# Patient Record
Sex: Female | Born: 1935 | Race: White | Hispanic: No | Marital: Married | Smoking: Never smoker
Health system: Southern US, Community
[De-identification: ages and names within clinical notes are randomized; demographics above are authoritative.]

## PROBLEM LIST (undated history)

## (undated) DIAGNOSIS — E039 Hypothyroidism, unspecified: Secondary | ICD-10-CM

## (undated) DIAGNOSIS — K660 Peritoneal adhesions (postprocedural) (postinfection): Secondary | ICD-10-CM

## (undated) DIAGNOSIS — M549 Dorsalgia, unspecified: Secondary | ICD-10-CM

## (undated) DIAGNOSIS — I10 Essential (primary) hypertension: Secondary | ICD-10-CM

## (undated) DIAGNOSIS — G309 Alzheimer's disease, unspecified: Secondary | ICD-10-CM

## (undated) DIAGNOSIS — R413 Other amnesia: Secondary | ICD-10-CM

## (undated) DIAGNOSIS — G629 Polyneuropathy, unspecified: Secondary | ICD-10-CM

## (undated) DIAGNOSIS — K567 Ileus, unspecified: Secondary | ICD-10-CM

## (undated) DIAGNOSIS — C50919 Malignant neoplasm of unspecified site of unspecified female breast: Secondary | ICD-10-CM

## (undated) DIAGNOSIS — J302 Other seasonal allergic rhinitis: Secondary | ICD-10-CM

## (undated) DIAGNOSIS — L719 Rosacea, unspecified: Secondary | ICD-10-CM

## (undated) DIAGNOSIS — M25519 Pain in unspecified shoulder: Secondary | ICD-10-CM

## (undated) DIAGNOSIS — Z8542 Personal history of malignant neoplasm of other parts of uterus: Secondary | ICD-10-CM

## (undated) DIAGNOSIS — C50112 Malignant neoplasm of central portion of left female breast: Secondary | ICD-10-CM

## (undated) DIAGNOSIS — Z8679 Personal history of other diseases of the circulatory system: Secondary | ICD-10-CM

## (undated) DIAGNOSIS — F0280 Dementia in other diseases classified elsewhere without behavioral disturbance: Secondary | ICD-10-CM

## (undated) DIAGNOSIS — N3281 Overactive bladder: Secondary | ICD-10-CM

## (undated) DIAGNOSIS — E7800 Pure hypercholesterolemia, unspecified: Secondary | ICD-10-CM

## (undated) DIAGNOSIS — K520 Gastroenteritis and colitis due to radiation: Secondary | ICD-10-CM

## (undated) DIAGNOSIS — C55 Malignant neoplasm of uterus, part unspecified: Secondary | ICD-10-CM

## (undated) HISTORY — DX: Other seasonal allergic rhinitis: J30.2

## (undated) HISTORY — DX: Malignant neoplasm of unspecified site of unspecified female breast: C50.919

## (undated) HISTORY — DX: Dorsalgia, unspecified: M54.9

## (undated) HISTORY — DX: Polyneuropathy, unspecified: G62.9

## (undated) HISTORY — DX: Pure hypercholesterolemia, unspecified: E78.00

## (undated) HISTORY — DX: Other amnesia: R41.3

## (undated) HISTORY — DX: Personal history of other diseases of the circulatory system: Z86.79

## (undated) HISTORY — DX: Pain in unspecified shoulder: M25.519

## (undated) HISTORY — PX: ANKLE SURGERY: SHX546

## (undated) HISTORY — PX: HERNIA REPAIR: SHX51

## (undated) HISTORY — DX: Malignant neoplasm of uterus, part unspecified: C55

## (undated) HISTORY — DX: Hypothyroidism, unspecified: E03.9

## (undated) HISTORY — DX: Rosacea, unspecified: L71.9

---

## 1988-11-12 HISTORY — PX: MASTECTOMY PARTIAL / LUMPECTOMY: SUR851

## 2006-01-02 ENCOUNTER — Encounter: Admission: RE | Admit: 2006-01-02 | Discharge: 2006-01-02 | Payer: Self-pay | Admitting: Internal Medicine

## 2006-04-25 ENCOUNTER — Other Ambulatory Visit: Admission: RE | Admit: 2006-04-25 | Discharge: 2006-04-25 | Payer: Self-pay | Admitting: Obstetrics and Gynecology

## 2006-07-02 ENCOUNTER — Encounter: Admission: RE | Admit: 2006-07-02 | Discharge: 2006-07-02 | Payer: Self-pay | Admitting: Obstetrics and Gynecology

## 2007-01-02 ENCOUNTER — Encounter: Admission: RE | Admit: 2007-01-02 | Discharge: 2007-01-02 | Payer: Self-pay | Admitting: Internal Medicine

## 2007-05-08 ENCOUNTER — Other Ambulatory Visit: Admission: RE | Admit: 2007-05-08 | Discharge: 2007-05-08 | Payer: Self-pay | Admitting: Obstetrics and Gynecology

## 2007-07-04 ENCOUNTER — Encounter: Admission: RE | Admit: 2007-07-04 | Discharge: 2007-07-04 | Payer: Self-pay | Admitting: Obstetrics and Gynecology

## 2008-07-05 ENCOUNTER — Encounter: Admission: RE | Admit: 2008-07-05 | Discharge: 2008-07-05 | Payer: Self-pay | Admitting: Obstetrics and Gynecology

## 2008-11-12 HISTORY — PX: ABDOMINAL HYSTERECTOMY: SHX81

## 2009-06-16 ENCOUNTER — Other Ambulatory Visit: Admission: RE | Admit: 2009-06-16 | Discharge: 2009-06-16 | Payer: Self-pay | Admitting: Obstetrics and Gynecology

## 2009-07-06 ENCOUNTER — Encounter: Admission: RE | Admit: 2009-07-06 | Discharge: 2009-07-06 | Payer: Self-pay | Admitting: Internal Medicine

## 2009-07-28 ENCOUNTER — Ambulatory Visit (HOSPITAL_COMMUNITY): Admission: RE | Admit: 2009-07-28 | Discharge: 2009-07-29 | Payer: Self-pay | Admitting: Obstetrics and Gynecology

## 2009-07-28 ENCOUNTER — Encounter (INDEPENDENT_AMBULATORY_CARE_PROVIDER_SITE_OTHER): Payer: Self-pay | Admitting: Obstetrics and Gynecology

## 2009-10-11 ENCOUNTER — Ambulatory Visit: Admission: RE | Admit: 2009-10-11 | Discharge: 2009-11-11 | Payer: Self-pay | Admitting: Radiation Oncology

## 2009-11-14 ENCOUNTER — Ambulatory Visit: Admission: RE | Admit: 2009-11-14 | Discharge: 2009-12-22 | Payer: Self-pay | Admitting: Radiation Oncology

## 2009-11-17 LAB — CBC WITH DIFFERENTIAL/PLATELET
BASO%: 0.9 % (ref 0.0–2.0)
Basophils Absolute: 0 10*3/uL (ref 0.0–0.1)
MONO%: 8.5 % (ref 0.0–14.0)
NEUT#: 2.2 10*3/uL (ref 1.5–6.5)
NEUT%: 55.9 % (ref 38.4–76.8)
RBC: 3.17 10*6/uL — ABNORMAL LOW (ref 3.70–5.45)
RDW: 16.7 % — ABNORMAL HIGH (ref 11.2–14.5)
WBC: 3.9 10*3/uL (ref 3.9–10.3)
lymph#: 1.1 10*3/uL (ref 0.9–3.3)

## 2009-11-17 LAB — BASIC METABOLIC PANEL
BUN: 9 mg/dL (ref 6–23)
Calcium: 9.4 mg/dL (ref 8.4–10.5)
Glucose, Bld: 85 mg/dL (ref 70–99)
Sodium: 142 mEq/L (ref 135–145)

## 2009-12-13 ENCOUNTER — Ambulatory Visit: Payer: Self-pay | Admitting: Psychiatry

## 2009-12-14 LAB — CBC WITH DIFFERENTIAL/PLATELET
Eosinophils Absolute: 1.2 10*3/uL — ABNORMAL HIGH (ref 0.0–0.5)
MONO#: 0.5 10*3/uL (ref 0.1–0.9)
NEUT#: 3 10*3/uL (ref 1.5–6.5)
NEUT%: 58.4 % (ref 38.4–76.8)
Platelets: 260 10*3/uL (ref 145–400)
RBC: 3.13 10*6/uL — ABNORMAL LOW (ref 3.70–5.45)
RDW: 15.3 % — ABNORMAL HIGH (ref 11.2–14.5)
WBC: 5.2 10*3/uL (ref 3.9–10.3)

## 2009-12-14 LAB — BASIC METABOLIC PANEL
BUN: 9 mg/dL (ref 6–23)
Calcium: 9.3 mg/dL (ref 8.4–10.5)
Creatinine, Ser: 0.82 mg/dL (ref 0.40–1.20)
Glucose, Bld: 96 mg/dL (ref 70–99)
Sodium: 139 mEq/L (ref 135–145)

## 2009-12-28 ENCOUNTER — Ambulatory Visit: Admission: RE | Admit: 2009-12-28 | Discharge: 2010-01-12 | Payer: Self-pay | Admitting: Radiation Oncology

## 2010-03-31 ENCOUNTER — Encounter: Admission: RE | Admit: 2010-03-31 | Discharge: 2010-03-31 | Payer: Self-pay | Admitting: Neurology

## 2010-07-07 ENCOUNTER — Encounter: Admission: RE | Admit: 2010-07-07 | Discharge: 2010-07-07 | Payer: Self-pay | Admitting: Internal Medicine

## 2010-08-21 ENCOUNTER — Ambulatory Visit: Admission: RE | Admit: 2010-08-21 | Discharge: 2010-08-21 | Payer: Self-pay | Admitting: Radiation Oncology

## 2010-08-22 ENCOUNTER — Other Ambulatory Visit: Admission: RE | Admit: 2010-08-22 | Discharge: 2010-08-22 | Payer: Self-pay | Admitting: Radiation Oncology

## 2011-02-16 LAB — CBC
MCV: 99.2 fL (ref 78.0–100.0)
MCV: 98.8 fL (ref 78.0–100.0)
Platelets: 257 10*3/uL (ref 150–400)
RBC: 3.26 MIL/uL — ABNORMAL LOW (ref 3.87–5.11)
RBC: 4.1 MIL/uL (ref 3.87–5.11)
RDW: 12.7 % (ref 11.5–15.5)

## 2011-02-16 LAB — BASIC METABOLIC PANEL
GFR calc non Af Amer: 53 mL/min — ABNORMAL LOW (ref >60)
Sodium: 142 mEq/L (ref 135–145)

## 2011-02-16 LAB — URINALYSIS, MICROSCOPIC ONLY
Glucose, UA: NEGATIVE mg/dL
Hgb urine dipstick: NEGATIVE
Nitrite: NEGATIVE
Specific Gravity, Urine: 1.02 (ref 1.005–1.030)
pH: 5.5 (ref 5.0–8.0)

## 2011-02-16 LAB — TYPE AND SCREEN
ABO/RH(D): B POS
Antibody Screen: NEGATIVE

## 2011-02-26 ENCOUNTER — Ambulatory Visit: Payer: Medicare Other | Attending: Radiation Oncology | Admitting: Radiation Oncology

## 2011-02-26 ENCOUNTER — Other Ambulatory Visit (HOSPITAL_COMMUNITY)
Admission: RE | Admit: 2011-02-26 | Discharge: 2011-02-26 | Disposition: A | Discharge status: Home / Self Care | Payer: Medicare Other | Source: Ambulatory Visit | Attending: Radiation Oncology | Admitting: Radiation Oncology

## 2011-02-26 ENCOUNTER — Other Ambulatory Visit: Payer: Self-pay | Admitting: Radiation Oncology

## 2011-02-26 DIAGNOSIS — Z8540 Personal history of malignant neoplasm of unspecified female genital organ: Secondary | ICD-10-CM | POA: Insufficient documentation

## 2011-06-22 ENCOUNTER — Other Ambulatory Visit: Payer: Self-pay | Admitting: Obstetrics and Gynecology

## 2011-06-22 DIAGNOSIS — Z1231 Encounter for screening mammogram for malignant neoplasm of breast: Secondary | ICD-10-CM

## 2011-07-24 ENCOUNTER — Ambulatory Visit
Admission: RE | Admit: 2011-07-24 | Discharge: 2011-07-24 | Disposition: A | Discharge status: Home / Self Care | Payer: Medicare Other | Source: Ambulatory Visit | Attending: Obstetrics and Gynecology | Admitting: Obstetrics and Gynecology

## 2011-07-24 DIAGNOSIS — Z1231 Encounter for screening mammogram for malignant neoplasm of breast: Secondary | ICD-10-CM

## 2011-09-04 ENCOUNTER — Ambulatory Visit
Admission: RE | Admit: 2011-09-04 | Discharge: 2011-09-04 | Disposition: A | Discharge status: Home / Self Care | Payer: Medicare Other | Source: Ambulatory Visit | Attending: Radiation Oncology | Admitting: Radiation Oncology

## 2011-12-27 ENCOUNTER — Other Ambulatory Visit (HOSPITAL_COMMUNITY): Payer: Self-pay | Admitting: Obstetrics and Gynecology

## 2011-12-27 DIAGNOSIS — C541 Malignant neoplasm of endometrium: Secondary | ICD-10-CM

## 2012-03-28 ENCOUNTER — Ambulatory Visit (HOSPITAL_COMMUNITY)
Admission: RE | Admit: 2012-03-28 | Discharge: 2012-03-28 | Disposition: A | Discharge status: Home / Self Care | Payer: Medicare Other | Source: Ambulatory Visit | Attending: Obstetrics and Gynecology | Admitting: Obstetrics and Gynecology

## 2012-03-28 DIAGNOSIS — Z90710 Acquired absence of both cervix and uterus: Secondary | ICD-10-CM | POA: Insufficient documentation

## 2012-03-28 DIAGNOSIS — K7689 Other specified diseases of liver: Secondary | ICD-10-CM | POA: Insufficient documentation

## 2012-03-28 DIAGNOSIS — C541 Malignant neoplasm of endometrium: Secondary | ICD-10-CM

## 2012-03-28 DIAGNOSIS — C50919 Malignant neoplasm of unspecified site of unspecified female breast: Secondary | ICD-10-CM | POA: Insufficient documentation

## 2012-03-28 LAB — BUN: BUN: 14 mg/dL (ref 6–23)

## 2012-03-28 LAB — CREATININE, SERUM: Creatinine, Ser: 1.04 mg/dL (ref 0.50–1.10)

## 2012-03-28 MED ORDER — IOHEXOL 300 MG/ML  SOLN
100.0000 mL | Freq: Once | INTRAMUSCULAR | Status: AC | PRN
  Administered 2012-03-28: 100 mL via INTRAVENOUS

## 2012-07-15 ENCOUNTER — Other Ambulatory Visit: Payer: Self-pay | Admitting: Obstetrics and Gynecology

## 2012-07-15 DIAGNOSIS — Z1231 Encounter for screening mammogram for malignant neoplasm of breast: Secondary | ICD-10-CM

## 2012-08-14 ENCOUNTER — Ambulatory Visit
Admission: RE | Admit: 2012-08-14 | Discharge: 2012-08-14 | Disposition: A | Discharge status: Home / Self Care | Payer: Medicare Other | Source: Ambulatory Visit | Attending: Obstetrics and Gynecology | Admitting: Obstetrics and Gynecology

## 2012-08-14 DIAGNOSIS — Z1231 Encounter for screening mammogram for malignant neoplasm of breast: Secondary | ICD-10-CM

## 2012-09-30 ENCOUNTER — Inpatient Hospital Stay (HOSPITAL_COMMUNITY)
Admission: EM | Admit: 2012-09-30 | Discharge: 2012-10-02 | DRG: 394 | Disposition: A | Discharge status: Home / Self Care | Payer: Medicare Other | Attending: Surgery | Admitting: Surgery

## 2012-09-30 ENCOUNTER — Encounter (HOSPITAL_COMMUNITY): Payer: Self-pay

## 2012-09-30 ENCOUNTER — Emergency Department (HOSPITAL_COMMUNITY): Payer: Medicare Other

## 2012-09-30 DIAGNOSIS — Y842 Radiological procedure and radiotherapy as the cause of abnormal reaction of the patient, or of later complication, without mention of misadventure at the time of the procedure: Secondary | ICD-10-CM | POA: Diagnosis present

## 2012-09-30 DIAGNOSIS — R112 Nausea with vomiting, unspecified: Secondary | ICD-10-CM | POA: Diagnosis present

## 2012-09-30 DIAGNOSIS — K520 Gastroenteritis and colitis due to radiation: Principal | ICD-10-CM | POA: Diagnosis present

## 2012-09-30 DIAGNOSIS — Z8542 Personal history of malignant neoplasm of other parts of uterus: Secondary | ICD-10-CM

## 2012-09-30 DIAGNOSIS — K56609 Unspecified intestinal obstruction, unspecified as to partial versus complete obstruction: Secondary | ICD-10-CM

## 2012-09-30 DIAGNOSIS — I10 Essential (primary) hypertension: Secondary | ICD-10-CM | POA: Diagnosis present

## 2012-09-30 DIAGNOSIS — T66XXXS Radiation sickness, unspecified, sequela: Secondary | ICD-10-CM

## 2012-09-30 DIAGNOSIS — K565 Intestinal adhesions [bands], unspecified as to partial versus complete obstruction: Secondary | ICD-10-CM | POA: Diagnosis present

## 2012-09-30 HISTORY — DX: Essential (primary) hypertension: I10

## 2012-09-30 HISTORY — DX: Overactive bladder: N32.81

## 2012-09-30 LAB — CBC WITH DIFFERENTIAL/PLATELET
Basophils Absolute: 0.1 10*3/uL (ref 0.0–0.1)
Basophils Relative: 1 % (ref 0–1)
Eosinophils Relative: 1 % (ref 0–5)
HCT: 40.8 % (ref 36.0–46.0)
Hemoglobin: 13.7 g/dL (ref 12.0–15.0)
MCH: 32.8 pg (ref 26.0–34.0)
MCHC: 33.6 g/dL (ref 30.0–36.0)
MCV: 97.6 fL (ref 78.0–100.0)
Monocytes Absolute: 0.7 10*3/uL (ref 0.1–1.0)
Monocytes Relative: 7 % (ref 3–12)
Neutro Abs: 8.2 10*3/uL — ABNORMAL HIGH (ref 1.7–7.7)
RDW: 13.2 % (ref 11.5–15.5)

## 2012-09-30 LAB — URINALYSIS, ROUTINE W REFLEX MICROSCOPIC
Bilirubin Urine: NEGATIVE
Glucose, UA: NEGATIVE mg/dL
Hgb urine dipstick: NEGATIVE
Ketones, ur: 15 mg/dL — AB
Protein, ur: NEGATIVE mg/dL
pH: 8 (ref 5.0–8.0)

## 2012-09-30 LAB — COMPREHENSIVE METABOLIC PANEL
AST: 28 U/L (ref 0–37)
Albumin: 4 g/dL (ref 3.5–5.2)
BUN: 19 mg/dL (ref 6–23)
Calcium: 10.2 mg/dL (ref 8.4–10.5)
Chloride: 104 mEq/L (ref 96–112)
Creatinine, Ser: 0.85 mg/dL (ref 0.50–1.10)
GFR calc non Af Amer: 65 mL/min — ABNORMAL LOW (ref >90)
Total Bilirubin: 0.5 mg/dL (ref 0.3–1.2)

## 2012-09-30 LAB — URINE MICROSCOPIC-ADD ON

## 2012-09-30 LAB — LIPASE, BLOOD: Lipase: 45 U/L (ref 11–59)

## 2012-09-30 MED ORDER — MIRABEGRON ER 25 MG PO TB24: 25.0000 mg | ORAL_TABLET | Freq: Every day | ORAL | Status: DC

## 2012-09-30 MED ORDER — GABAPENTIN 100 MG PO CAPS
100.0000 mg | ORAL_CAPSULE | Freq: Three times a day (TID) | ORAL | Status: DC
  Administered 2012-09-30 – 2012-10-02 (×6): 100 mg via ORAL
  Filled 2012-09-30 (×8): qty 1

## 2012-09-30 MED ORDER — DIPHENHYDRAMINE HCL 50 MG/ML IJ SOLN: 12.5000 mg | Freq: Four times a day (QID) | INTRAMUSCULAR | Status: DC | PRN

## 2012-09-30 MED ORDER — SODIUM CHLORIDE 0.9 % IV SOLN
Freq: Once | INTRAVENOUS | Status: AC
  Administered 2012-09-30: 07:00:00 via INTRAVENOUS

## 2012-09-30 MED ORDER — ENOXAPARIN SODIUM 40 MG/0.4ML ~~LOC~~ SOLN
40.0000 mg | SUBCUTANEOUS | Status: DC
  Filled 2012-09-30: qty 0.4

## 2012-09-30 MED ORDER — ONDANSETRON HCL 4 MG/2ML IJ SOLN: 4.0000 mg | Freq: Four times a day (QID) | INTRAMUSCULAR | Status: DC | PRN

## 2012-09-30 MED ORDER — PROPRANOLOL HCL ER 120 MG PO CP24
120.0000 mg | ORAL_CAPSULE | Freq: Every day | ORAL | Status: DC
  Administered 2012-09-30 – 2012-10-02 (×3): 120 mg via ORAL
  Filled 2012-09-30 (×3): qty 1

## 2012-09-30 MED ORDER — FENTANYL CITRATE 0.05 MG/ML IJ SOLN
25.0000 ug | Freq: Once | INTRAMUSCULAR | Status: AC
  Administered 2012-09-30: 25 ug via INTRAVENOUS
  Filled 2012-09-30: qty 2

## 2012-09-30 MED ORDER — KCL IN DEXTROSE-NACL 20-5-0.9 MEQ/L-%-% IV SOLN
INTRAVENOUS | Status: DC
  Administered 2012-09-30: 100 mL/h via INTRAVENOUS
  Administered 2012-10-01 (×2): via INTRAVENOUS
  Filled 2012-09-30 (×5): qty 1000

## 2012-09-30 MED ORDER — SODIUM CHLORIDE 0.9 % IV BOLUS (SEPSIS)
1000.0000 mL | Freq: Once | INTRAVENOUS | Status: AC
  Administered 2012-09-30: 1000 mL via INTRAVENOUS

## 2012-09-30 MED ORDER — DIPHENHYDRAMINE HCL 12.5 MG/5ML PO ELIX: 12.5000 mg | ORAL_SOLUTION | Freq: Four times a day (QID) | ORAL | Status: DC | PRN

## 2012-09-30 MED ORDER — OXYCODONE HCL 5 MG PO TABS: 5.0000 mg | ORAL_TABLET | ORAL | Status: DC | PRN

## 2012-09-30 MED ORDER — DOCUSATE SODIUM 100 MG PO CAPS
100.0000 mg | ORAL_CAPSULE | Freq: Two times a day (BID) | ORAL | Status: DC
  Administered 2012-09-30 – 2012-10-02 (×4): 100 mg via ORAL
  Filled 2012-09-30 (×5): qty 1

## 2012-09-30 MED ORDER — ACETAMINOPHEN 325 MG PO TABS: 650.0000 mg | ORAL_TABLET | Freq: Four times a day (QID) | ORAL | Status: DC | PRN

## 2012-09-30 MED ORDER — IOHEXOL 300 MG/ML  SOLN
100.0000 mL | Freq: Once | INTRAMUSCULAR | Status: AC | PRN
  Administered 2012-09-30: 100 mL via INTRAVENOUS

## 2012-09-30 MED ORDER — ATORVASTATIN CALCIUM 20 MG PO TABS
20.0000 mg | ORAL_TABLET | Freq: Every day | ORAL | Status: DC
  Administered 2012-09-30 – 2012-10-01 (×2): 20 mg via ORAL
  Filled 2012-09-30 (×3): qty 1

## 2012-09-30 MED ORDER — ONDANSETRON HCL 4 MG/2ML IJ SOLN
4.0000 mg | Freq: Once | INTRAMUSCULAR | Status: AC
  Administered 2012-09-30: 4 mg via INTRAVENOUS
  Filled 2012-09-30: qty 2

## 2012-09-30 MED ORDER — HYDROMORPHONE HCL PF 1 MG/ML IJ SOLN: 0.5000 mg | INTRAMUSCULAR | Status: DC | PRN

## 2012-09-30 MED ORDER — PROMETHAZINE HCL 25 MG/ML IJ SOLN
12.5000 mg | Freq: Three times a day (TID) | INTRAMUSCULAR | Status: DC | PRN
Start: 2012-09-30 — End: 2012-10-02
  Filled 2012-09-30: qty 1

## 2012-09-30 MED ORDER — ENOXAPARIN SODIUM 40 MG/0.4ML ~~LOC~~ SOLN
40.0000 mg | SUBCUTANEOUS | Status: DC
  Administered 2012-10-01: 40 mg via SUBCUTANEOUS
  Filled 2012-09-30 (×3): qty 0.4

## 2012-09-30 MED ORDER — ACETAMINOPHEN 650 MG RE SUPP: 650.0000 mg | Freq: Four times a day (QID) | RECTAL | Status: DC | PRN

## 2012-09-30 MED ORDER — DARIFENACIN HYDROBROMIDE ER 7.5 MG PO TB24: 7.5000 mg | ORAL_TABLET | Freq: Every day | ORAL | Status: DC

## 2012-09-30 MED ORDER — MORPHINE SULFATE 2 MG/ML IJ SOLN
2.0000 mg | Freq: Once | INTRAMUSCULAR | Status: AC
  Administered 2012-09-30: 2 mg via INTRAVENOUS
  Filled 2012-09-30: qty 1

## 2012-09-30 MED ORDER — PANTOPRAZOLE SODIUM 40 MG IV SOLR
40.0000 mg | Freq: Every day | INTRAVENOUS | Status: DC
  Administered 2012-09-30 – 2012-10-01 (×2): 40 mg via INTRAVENOUS
  Filled 2012-09-30 (×3): qty 40

## 2012-09-30 NOTE — ED Notes (Signed)
Per pt started having abdominal pain and n/v since late afternoon yesterday.  Pain mid abdomen.

## 2012-09-30 NOTE — H&P (Signed)
Agree with above. Partial SBO possible due to adhesions vs radiation enteritis  No indication yet for NG tube  Bowel rest/ IV hydration.  Wilmon Arms. Corliss Skains, MD, [facility] Surgery  09/30/2012 1:17 PM

## 2012-09-30 NOTE — ED Notes (Signed)
Patient transported to CT 

## 2012-09-30 NOTE — ED Provider Notes (Signed)
Results for orders placed during the hospital encounter of 09/30/12  URINALYSIS, ROUTINE W REFLEX MICROSCOPIC      Component Value Range   Color, Urine YELLOW  YELLOW   APPearance CLOUDY (*) CLEAR   Specific Gravity, Urine 1.029  1.005 - 1.030   pH 8.0  5.0 - 8.0   Glucose, UA NEGATIVE  NEGATIVE mg/dL   Hgb urine dipstick NEGATIVE  NEGATIVE   Bilirubin Urine NEGATIVE  NEGATIVE   Ketones, ur 15 (*) NEGATIVE mg/dL   Protein, ur NEGATIVE  NEGATIVE mg/dL   Urobilinogen, UA 0.2  0.0 - 1.0 mg/dL   Nitrite NEGATIVE  NEGATIVE   Leukocytes, UA SMALL (*) NEGATIVE  CBC WITH DIFFERENTIAL      Component Value Range   WBC 10.1  4.0 - 10.5 K/uL   RBC 4.18  3.87 - 5.11 MIL/uL   Hemoglobin 13.7  12.0 - 15.0 g/dL   HCT 16.1  09.6 - 04.5 %   MCV 97.6  78.0 - 100.0 fL   MCH 32.8  26.0 - 34.0 pg   MCHC 33.6  30.0 - 36.0 g/dL   RDW 40.9  81.1 - 91.4 %   Platelets 230  150 - 400 K/uL   Neutrophils Relative 81 (*) 43 - 77 %   Neutro Abs 8.2 (*) 1.7 - 7.7 K/uL   Lymphocytes Relative 11 (*) 12 - 46 %   Lymphs Abs 1.1  0.7 - 4.0 K/uL   Monocytes Relative 7  3 - 12 %   Monocytes Absolute 0.7  0.1 - 1.0 K/uL   Eosinophils Relative 1  0 - 5 %   Eosinophils Absolute 0.1  0.0 - 0.7 K/uL   Basophils Relative 1  0 - 1 %   Basophils Absolute 0.1  0.0 - 0.1 K/uL  COMPREHENSIVE METABOLIC PANEL      Component Value Range   Sodium 139  135 - 145 mEq/L   Potassium 4.1  3.5 - 5.1 mEq/L   Chloride 104  96 - 112 mEq/L   CO2 20  19 - 32 mEq/L   Glucose, Bld 124 (*) 70 - 99 mg/dL   BUN 19  6 - 23 mg/dL   Creatinine, Ser 7.82  0.50 - 1.10 mg/dL   Calcium 95.6  8.4 - 21.3 mg/dL   Total Protein 7.2  6.0 - 8.3 g/dL   Albumin 4.0  3.5 - 5.2 g/dL   AST 28  0 - 37 U/L   ALT 19  0 - 35 U/L   Alkaline Phosphatase 73  39 - 117 U/L   Total Bilirubin 0.5  0.3 - 1.2 mg/dL   GFR calc non Af Amer 65 (*) >90 mL/min   GFR calc Af Amer 76 (*) >90 mL/min  LIPASE, BLOOD      Component Value Range   Lipase 45  11 - 59 U/L    URINE MICROSCOPIC-ADD ON      Component Value Range   WBC, UA 0-2  <3 WBC/hpf   RBC / HPF 0-2  <3 RBC/hpf   Urine-Other AMORPHOUS URATES/PHOSPHATES     Ct Abdomen Pelvis W Contrast  09/30/2012  *RADIOLOGY REPORT*  Clinical Data: Nausea, vomiting, abdominal pain, history of endometrial carcinoma  CT ABDOMEN AND PELVIS WITH CONTRAST  Technique:  Multidetector CT imaging of the abdomen and pelvis was performed following the standard protocol during bolus administration of intravenous contrast.  Contrast: OMNIPAQUE IOHEXOL 300 MG/ML  SOLN  Comparison: CT abdomen pelvis of  03/28/2012  Findings: The lung bases are clear.  Mild cardiomegaly is stable. The previously described sub centimeter low attenuation structures in the liver are stable and consistent with a benign process.  No new hepatic lesion is seen.  No calcified gallstones are noted. The pancreas is normal in size and the pancreatic duct is not dilated.  The adrenal glands and spleen are unremarkable.  The stomach is distended with contrast material and is unremarkable. The kidneys enhance with no calculus or mass and no hydronephrosis is seen.  The abdominal aorta is normal in caliber.  There are dilated loops of small bowel within the pelvis and lower abdomen consistent with a partial small bowel obstruction.  The distal small bowel is decompressed.  The porta caliber change appears to be within the mid pelvis and there is some thickening of the small bowel mucosa at that site.  Some thickening could be due to radiation enteritis, but edema cannot be excluded.  There is a small amount of interloop fluid within the pelvis.  The urinary bladder is decompressed and cannot be evaluated.  The patient has previous undergone hysterectomy.  No mass or adenopathy is seen.  There is degenerative disc disease at L5-S1 with a very slight anterolisthesis of L5 on S1 and L4 on L5 which appears degenerative in origin.  IMPRESSION:  1.  Dilated loops of small  bowel with apparent transition point in the mid lower pelvis consistent with partial small bowel obstruction. 2.  Some mucosal thickening at that site may indicate radiation enteritis or possibly edema.  There is a small amount of fluid within the pelvis. 3.  Slight anterolisthesis of L5 on S1 and L4 on L5 most likely degenerative in origin.   Original Report Authenticated By: Dwyane Dee, M.D.     Consulted surgery who will come and see/admit pt.  Rolan Bucco, MD 09/30/12 [phone]

## 2012-09-30 NOTE — ED Notes (Signed)
Report called to the floor nurse unavailable at this time call try again

## 2012-09-30 NOTE — H&P (Signed)
Laura Perkins is an 77 y.o. female.   Chief Complaint: abdominal pain with nausea andf vomiting HPI: 77 year old female presents emergency department with report of onset of nausea and vomiting and diffuse abdominal pain yesterday afternoon. No fevers no chills no diarrhea. No known sick contacts, no unusual foods or travel. No new meds recently. No prior history of same. Patient with history of uterine cancer status post hysterectomy. No other abdominal surgeries.She reports she's been cancer free for 3 years. No prior history of bowel obstruction. She reports symptoms come in waves. CT of abdomen and pelvis done on admission: IMPRESSION:  1. Dilated loops of small bowel with apparent transition point in  the mid lower pelvis consistent with partial small bowel  obstruction.  2. Some mucosal thickening at that site may indicate radiation  enteritis or possibly edema. There is a small amount of fluid  within the pelvis.  3. Slight anterolisthesis of L5 on S1 and L4 on L5 most likely  degenerative in origin.  Original Report Authenticated By: Dwyane Dee, M.D. At present patient is w/o N/V/D or abdominal tenderness; minimal BS,  states that the last time she had emesis was at 3am this morning, last BM was around the same time described as several small ones all formed. In the interim patient has rec'vd IVF and pain medications and antiemetics in the ED. We have been asked to see the patient and to admit her for management of her psbo. She will be admitted to med-surg floorCurrently BMP and WBC are wnl.  Not sure as to the cause; but this may represent radiation enteritis or edema secondary to her previous treatment for uterine cancer.  . We will treat her initially with conservative measures i.e. NPO, IVF,follow labs and clinical picture.   Past Medical History  Diagnosis Date  . Hypertension   . Overactive bladder   . Cancer     carcinoma sarcoma    Past Surgical History  Procedure  Date  . Abdominal hysterectomy   . Breast biopsy   . Ankle surgery     History reviewed. No pertinent family history. Social History:  reports that she has never smoked. She does not have any smokeless tobacco history on file. She reports that she does not drink alcohol or use illicit drugs.  Allergies: No Known Allergies   (Not in a hospital admission)  Results for orders placed during the hospital encounter of 09/30/12 (from the past 48 hour(s))  URINALYSIS, ROUTINE W REFLEX MICROSCOPIC     Status: Abnormal   Collection Time   09/30/12  4:29 AM      Component Value Range Comment   Color, Urine YELLOW  YELLOW    APPearance CLOUDY (*) CLEAR    Specific Gravity, Urine 1.029  1.005 - 1.030    pH 8.0  5.0 - 8.0    Glucose, UA NEGATIVE  NEGATIVE mg/dL    Hgb urine dipstick NEGATIVE  NEGATIVE    Bilirubin Urine NEGATIVE  NEGATIVE    Ketones, ur 15 (*) NEGATIVE mg/dL    Protein, ur NEGATIVE  NEGATIVE mg/dL    Urobilinogen, UA 0.2  0.0 - 1.0 mg/dL    Nitrite NEGATIVE  NEGATIVE    Leukocytes, UA SMALL (*) NEGATIVE   URINE MICROSCOPIC-ADD ON     Status: Normal   Collection Time   09/30/12  4:29 AM      Component Value Range Comment   WBC, UA 0-2  <3 WBC/hpf    RBC /  HPF 0-2  <3 RBC/hpf    Urine-Other AMORPHOUS URATES/PHOSPHATES     CBC WITH DIFFERENTIAL     Status: Abnormal   Collection Time   09/30/12  5:02 AM      Component Value Range Comment   WBC 10.1  4.0 - 10.5 K/uL    RBC 4.18  3.87 - 5.11 MIL/uL    Hemoglobin 13.7  12.0 - 15.0 g/dL    HCT 04.5  40.9 - 81.1 %    MCV 97.6  78.0 - 100.0 fL    MCH 32.8  26.0 - 34.0 pg    MCHC 33.6  30.0 - 36.0 g/dL    RDW 91.4  78.2 - 95.6 %    Platelets 230  150 - 400 K/uL    Neutrophils Relative 81 (*) 43 - 77 %    Neutro Abs 8.2 (*) 1.7 - 7.7 K/uL    Lymphocytes Relative 11 (*) 12 - 46 %    Lymphs Abs 1.1  0.7 - 4.0 K/uL    Monocytes Relative 7  3 - 12 %    Monocytes Absolute 0.7  0.1 - 1.0 K/uL    Eosinophils Relative 1  0 - 5  %    Eosinophils Absolute 0.1  0.0 - 0.7 K/uL    Basophils Relative 1  0 - 1 %    Basophils Absolute 0.1  0.0 - 0.1 K/uL   COMPREHENSIVE METABOLIC PANEL     Status: Abnormal   Collection Time   09/30/12  5:02 AM      Component Value Range Comment   Sodium 139  135 - 145 mEq/L    Potassium 4.1  3.5 - 5.1 mEq/L    Chloride 104  96 - 112 mEq/L    CO2 20  19 - 32 mEq/L    Glucose, Bld 124 (*) 70 - 99 mg/dL    BUN 19  6 - 23 mg/dL    Creatinine, Ser 2.13  0.50 - 1.10 mg/dL    Calcium 08.6  8.4 - 10.5 mg/dL    Total Protein 7.2  6.0 - 8.3 g/dL    Albumin 4.0  3.5 - 5.2 g/dL    AST 28  0 - 37 U/L    ALT 19  0 - 35 U/L    Alkaline Phosphatase 73  39 - 117 U/L    Total Bilirubin 0.5  0.3 - 1.2 mg/dL    GFR calc non Af Amer 65 (*) >90 mL/min    GFR calc Af Amer 76 (*) >90 mL/min   LIPASE, BLOOD     Status: Normal   Collection Time   09/30/12  5:02 AM      Component Value Range Comment   Lipase 45  11 - 59 U/L    Ct Abdomen Pelvis W Contrast  09/30/2012  *RADIOLOGY REPORT*  Clinical Data: Nausea, vomiting, abdominal pain, history of endometrial carcinoma  CT ABDOMEN AND PELVIS WITH CONTRAST  Technique:  Multidetector CT imaging of the abdomen and pelvis was performed following the standard protocol during bolus administration of intravenous contrast.  Contrast: OMNIPAQUE IOHEXOL 300 MG/ML  SOLN  Comparison: CT abdomen pelvis of 03/28/2012  Findings: The lung bases are clear.  Mild cardiomegaly is stable. The previously described sub centimeter low attenuation structures in the liver are stable and consistent with a benign process.  No new hepatic lesion is seen.  No calcified gallstones are noted. The pancreas is normal in size and the  pancreatic duct is not dilated.  The adrenal glands and spleen are unremarkable.  The stomach is distended with contrast material and is unremarkable. The kidneys enhance with no calculus or mass and no hydronephrosis is seen.  The abdominal aorta is normal  in caliber.  There are dilated loops of small bowel within the pelvis and lower abdomen consistent with a partial small bowel obstruction.  The distal small bowel is decompressed.  The porta caliber change appears to be within the mid pelvis and there is some thickening of the small bowel mucosa at that site.  Some thickening could be due to radiation enteritis, but edema cannot be excluded.  There is a small amount of interloop fluid within the pelvis.  The urinary bladder is decompressed and cannot be evaluated.  The patient has previous undergone hysterectomy.  No mass or adenopathy is seen.  There is degenerative disc disease at L5-S1 with a very slight anterolisthesis of L5 on S1 and L4 on L5 which appears degenerative in origin.  IMPRESSION:  1.  Dilated loops of small bowel with apparent transition point in the mid lower pelvis consistent with partial small bowel obstruction. 2.  Some mucosal thickening at that site may indicate radiation enteritis or possibly edema.  There is a small amount of fluid within the pelvis. 3.  Slight anterolisthesis of L5 on S1 and L4 on L5 most likely degenerative in origin.   Original Report Authenticated By: Dwyane Dee, M.D.     Review of Systems  Constitutional: Negative.   HENT: Negative.   Eyes: Negative.   Respiratory: Negative.   Cardiovascular: Negative.   Gastrointestinal: Positive for nausea and vomiting. Negative for heartburn, abdominal pain, diarrhea, constipation, blood in stool and melena.  Genitourinary: Negative.        Hx of stress incontinence. History of uterine cancer status post hysterectomy  Musculoskeletal: Negative.   Skin: Negative.   Neurological: Negative.   Endo/Heme/Allergies: Negative.   Psychiatric/Behavioral: Negative.     Blood pressure 111/75, pulse 72, temperature 98 F (36.7 C), temperature source Oral, resp. rate 18, SpO2 100.00%. Physical Exam  Constitutional: She is oriented to person, place, and time. She appears  well-developed and well-nourished. No distress.  HENT:  Head: Normocephalic and atraumatic.  Mouth/Throat: No oropharyngeal exudate.  Eyes: Conjunctivae normal and EOM are normal. Pupils are equal, round, and reactive to light. Right eye exhibits no discharge. Left eye exhibits no discharge. No scleral icterus.  Neck: Normal range of motion. Neck supple. No JVD present. No tracheal deviation present. No thyromegaly present.  Cardiovascular: Normal rate, regular rhythm, normal heart sounds and intact distal pulses.  Exam reveals no gallop and no friction rub.   No murmur heard. Respiratory: Effort normal and breath sounds normal. No stridor. No respiratory distress. She has no wheezes. She has no rales. She exhibits no tenderness.  GI: Soft. She exhibits no distension and no mass. There is no tenderness. There is no rebound and no guarding.       Hypoactive BS  Musculoskeletal: Normal range of motion. She exhibits no edema and no tenderness.  Lymphadenopathy:    She has no cervical adenopathy.  Neurological: She is alert and oriented to person, place, and time.  Skin: Skin is warm and dry. No rash noted. She is not diaphoretic. No erythema. No pallor.  Psychiatric: She has a normal mood and affect.     Assessment/Plan S/P hysterectomy for uterine CA in 2010. Hypertension Hx of urinary incontinence PSBO ?  Secondary to adhesions vs enteritis/edema related to past treatment for uterine CA./anticholernergics.  Plan: 1. NPO 2. IVF 3. Continue her antihypertensive,but will hold anticholinergics. 4. DVT and GERD prophylaxis, pain management 5. Ambulate/OOB 6. Follow clinical presentation    Golda Acre [facility] Surgery Pager [phone]  09/30/2012, 10:00 AM

## 2012-09-30 NOTE — ED Provider Notes (Signed)
History     CSN: [id number]  Arrival date & time 09/30/12  0401   First MD Initiated Contact with Patient 09/30/12 0403      Chief Complaint  Patient presents with  . Abdominal Pain    (Consider location/radiation/quality/duration/timing/severity/associated sxs/prior treatment) HPI 77 year old female presents emergency department with report of onset of nausea and vomiting and diffuse abdominal pain yesterday afternoon. No fevers no chills no diarrhea. No known sick contacts, no unusual foods or travel. No prior history of same. Patient with history of uterine cancer status post hysterectomy. She reports she's been cancer free for 3 years. No prior history of bowel obstruction. She reports symptoms come in waves   Past Medical History  Diagnosis Date  . Hypertension   . Overactive bladder   . Cancer     carcinoma sarcoma    Past Surgical History  Procedure Date  . Abdominal hysterectomy   . Breast biopsy   . Ankle surgery     History reviewed. No pertinent family history.  History  Substance Use Topics  . Smoking status: Never Smoker   . Smokeless tobacco: Not on file  . Alcohol Use: No    OB History    Grav Para Term Preterm Abortions TAB SAB Ect Mult Living                  Review of Systems  See History of Present Illness; otherwise all other systems are reviewed and negative Allergies  Review of patient's allergies indicates no known allergies.  Home Medications   Current Outpatient Rx  Name  Route  Sig  Dispense  Refill  . ATORVASTATIN CALCIUM 20 MG PO TABS   Oral   Take 20 mg by mouth daily.          Marland Kitchen GABAPENTIN 100 MG PO CAPS   Oral   Take 100 mg by mouth 3 (three) times daily.          Marland Kitchen MIRABEGRON ER 25 MG PO TB24   Oral   Take 25 mg by mouth daily.         Marland Kitchen PROPRANOLOL HCL ER 120 MG PO CP24   Oral   Take 120 mg by mouth daily.          Marland Kitchen SOLIFENACIN SUCCINATE 5 MG PO TABS   Oral   Take 10 mg by mouth daily.            BP 135/71  Pulse 64  Temp 97.9 F (36.6 C) (Oral)  Resp 20  SpO2 98%  Physical Exam  Nursing note and vitals reviewed. Constitutional: She is oriented to person, place, and time. She appears well-developed and well-nourished.  HENT:  Head: Normocephalic and atraumatic.  Nose: Nose normal.  Mouth/Throat: Oropharynx is clear and moist.  Eyes: Conjunctivae normal and EOM are normal. Pupils are equal, round, and reactive to light.  Neck: Normal range of motion. Neck supple. No JVD present. No tracheal deviation present. No thyromegaly present.  Cardiovascular: Normal rate, regular rhythm, normal heart sounds and intact distal pulses.  Exam reveals no gallop and no friction rub.   No murmur heard. Pulmonary/Chest: Effort normal and breath sounds normal. No stridor. No respiratory distress. She has no wheezes. She has no rales. She exhibits no tenderness.  Abdominal: Soft. Bowel sounds are normal. She exhibits no distension and no mass. There is tenderness (tenderness in a bandlike distribution across the mid abdomen worse in the umbilicus). There is no rebound and  no guarding.  Musculoskeletal: Normal range of motion. She exhibits no edema and no tenderness.  Lymphadenopathy:    She has no cervical adenopathy.  Neurological: She is alert and oriented to person, place, and time. She exhibits normal muscle tone. Coordination normal.  Skin: Skin is warm and dry. No rash noted. No erythema. No pallor.  Psychiatric: She has a normal mood and affect. Her behavior is normal. Judgment and thought content normal.    ED Course  Procedures (including critical care time)  Labs Reviewed  URINALYSIS, ROUTINE W REFLEX MICROSCOPIC - Abnormal; Notable for the following:    APPearance CLOUDY (*)     Ketones, ur 15 (*)     Leukocytes, UA SMALL (*)     All other components within normal limits  CBC WITH DIFFERENTIAL - Abnormal; Notable for the following:    Neutrophils Relative 81 (*)     Neutro  Abs 8.2 (*)     Lymphocytes Relative 11 (*)     All other components within normal limits  COMPREHENSIVE METABOLIC PANEL - Abnormal; Notable for the following:    Glucose, Bld 124 (*)     GFR calc non Af Amer 65 (*)     GFR calc Af Amer 76 (*)     All other components within normal limits  LIPASE, BLOOD  URINE MICROSCOPIC-ADD ON   No results found.   No diagnosis found.    MDM  77 year old female with acute onset of nausea and vomiting and diffuse abdominal pain. Will check baseline labs, UA. May need CT scan given history of hysterectomy and cancer.      5:44 AM Patient reexamined. She is more comfortable in the chair. She reports no improvement in the nausea after Zofran. Labs essentially unremarkable, the patient still very uncomfortable. Will order CT scan given history of uterine cancer and prior surgery. We'll get additional pain and nausea medicine ordered.  Olivia Mackie, MD 09/30/12 [phone]

## 2012-10-01 ENCOUNTER — Observation Stay (HOSPITAL_COMMUNITY): Payer: Medicare Other

## 2012-10-01 MED ORDER — BISACODYL 10 MG RE SUPP
10.0000 mg | Freq: Once | RECTAL | Status: AC
  Administered 2012-10-01: 10 mg via RECTAL
  Filled 2012-10-01: qty 1

## 2012-10-01 NOTE — Progress Notes (Signed)
Subjective: Patient states that she feels good this morning, no further episodes of abdominal pain. No N/V/D, + BS no flatus   Objective: Vital signs in last 24 hours: Temp:  [98.3 F (36.8 C)-98.7 F (37.1 C)] 98.6 F (37 C) (11/20 0700) Pulse Rate:  [64-88] 70  (11/20 0700) Resp:  [18-22] 18  (11/20 0700) BP: (101-116)/(57-76) 110/76 mmHg (11/20 0700) SpO2:  [93 %-99 %] 98 % (11/20 0700) Weight:  [145 lb (65.772 kg)] 145 lb (65.772 kg) (11/19 1600) Last BM Date: 09/29/12  Intake/Output from previous day: 11/19 0701 - 11/20 0700 In: 1505 [P.O.:240; I.V.:1265] Out: -  Intake/Output this shift:    General appearance: alert, cooperative, appears stated age and no distress Chest: CTA Cardiac: RRR Abdomen: soft, non tender, + BS, no flatus (has been taking ice chips only) No N/V/D Extremities: +pulses, warm to touch, no edema or tenderness.  Lab Results:   [facility] 09/30/12 0502  WBC 10.1  HGB 13.7  HCT 40.8  PLT 230   BMET  Basename 09/30/12 0502  NA 139  K 4.1  CL 104  CO2 20  GLUCOSE 124*  BUN 19  CREATININE 0.85  CALCIUM 10.2   PT/INR No results found for this basename: LABPROT:2,INR:2 in the last 72 hours ABG No results found for this basename: PHART:2,PCO2:2,PO2:2,HCO3:2 in the last 72 hours  Studies/Results: Ct Abdomen Pelvis W Contrast  09/30/2012  *RADIOLOGY REPORT*  Clinical Data: Nausea, vomiting, abdominal pain, history of endometrial carcinoma  CT ABDOMEN AND PELVIS WITH CONTRAST  Technique:  Multidetector CT imaging of the abdomen and pelvis was performed following the standard protocol during bolus administration of intravenous contrast.  Contrast: OMNIPAQUE IOHEXOL 300 MG/ML  SOLN  Comparison: CT abdomen pelvis of 03/28/2012  Findings: The lung bases are clear.  Mild cardiomegaly is stable. The previously described sub centimeter low attenuation structures in the liver are stable and consistent with a benign process.  No new hepatic  lesion is seen.  No calcified gallstones are noted. The pancreas is normal in size and the pancreatic duct is not dilated.  The adrenal glands and spleen are unremarkable.  The stomach is distended with contrast material and is unremarkable. The kidneys enhance with no calculus or mass and no hydronephrosis is seen.  The abdominal aorta is normal in caliber.  There are dilated loops of small bowel within the pelvis and lower abdomen consistent with a partial small bowel obstruction.  The distal small bowel is decompressed.  The porta caliber change appears to be within the mid pelvis and there is some thickening of the small bowel mucosa at that site.  Some thickening could be due to radiation enteritis, but edema cannot be excluded.  There is a small amount of interloop fluid within the pelvis.  The urinary bladder is decompressed and cannot be evaluated.  The patient has previous undergone hysterectomy.  No mass or adenopathy is seen.  There is degenerative disc disease at L5-S1 with a very slight anterolisthesis of L5 on S1 and L4 on L5 which appears degenerative in origin.  IMPRESSION:  1.  Dilated loops of small bowel with apparent transition point in the mid lower pelvis consistent with partial small bowel obstruction. 2.  Some mucosal thickening at that site may indicate radiation enteritis or possibly edema.  There is a small amount of fluid within the pelvis. 3.  Slight anterolisthesis of L5 on S1 and L4 on L5 most likely degenerative in origin.   Original Report  Authenticated By: Dwyane Dee, M.D.     Anti-infectives: Anti-infectives    None      Assessment/Plan:  PSBO secondary to either adhesions or radiation enteritis.  Plan:  1. PSBO appears to be resolving so will start trial of sips of clears for now. 2. Will re-check abdominal film today 3. Encourage ambulation/OOB   s/p * No surgery found *     LOS: 1 day    Blenda Mounts [facility] Surgery Pager #  [phone]  10/01/2012

## 2012-10-01 NOTE — Progress Notes (Signed)
Flatus today  no BM Abd - soft, non-tender  Clear liquid tray Dulcolax suppository  Wilmon Arms. Corliss Skains, MD, [facility] Surgery  10/01/2012 2:21 PM

## 2012-10-02 ENCOUNTER — Observation Stay (HOSPITAL_COMMUNITY): Payer: Medicare Other

## 2012-10-02 NOTE — Discharge Summary (Signed)
Physician Discharge Summary  Patient ID: Laura Perkins MRN: [id number] DOB/AGE: September 25, 1935 77 y.o.  Admit date: 09/30/2012 Discharge date: 10/02/2012  Admission Diagnoses: Partial SBO possible due to adhesions vs radiation enteritis   Discharge Diagnoses: Partial SBO possible due to adhesions vs radiation enteritis (resolved)   Active Problems:  * No active hospital problems. *    Discharged Condition: stable  Hospital Course: 77 year old female presents emergency department with report of onset of nausea and vomiting and diffuse abdominal pain. No fevers no chills no diarrhea. No known sick contacts, no unusual foods or travel. No new meds recently. No prior history of same. Patient with history of uterine cancer status post hysterectomy. No other abdominal surgeries.She reports she's been cancer free for 3 years. No prior history of bowel obstruction. She reports symptoms come in waves.  CT of abdomen and pelvis done on admission: IMPRESSION:  1. Dilated loops of small bowel with apparent transition point in  the mid lower pelvis consistent with partial small bowel  obstruction.  2. Some mucosal thickening at that site may indicate radiation  enteritis or possibly edema. There is a small amount of fluid  within the pelvis.  3. Slight anterolisthesis of L5 on S1 and L4 on L5 most likely  degenerative in origin.  Original Report Authenticated By: Dwyane Dee, M.D. Patient was admitted for conservative management of her complaint, During her stay she was treated with NPO status and IVF. She remained NPO until yesterday was allowed clear liquids initially and then progressed to a regular diet with the continued return of her bowel function, lack of abdominal pain,distention,nausea, vomiting. She is stable for discharge to home/self care and has been instructed to f/u with either her PCP or our office should her symptoms reoccur.  Patient has verbalized understanding of  same.   Consults: None  Significant Diagnostic Studies: labs and radiology.  Treatments: IV hydration, analgesia, and anticoagulation.  Discharge Exam: Blood pressure 156/90, pulse 62, temperature 98.4 F (36.9 C), temperature source Oral, resp. rate 18, height 5\' 7"  (1.702 m), weight 145 lb (65.772 kg), SpO2 97.00%. General appearance: alert, cooperative, appears stated age and no distress Chest: CTA Cardiac: RRR Abdomen: soft, flat, non tender, no N/V/D after eating. + BS, flatus and BM. Extremities: warm to touch, + pulses, no edema or tenderness.  Disposition: Home self care Patient has been directed to f/u with either her PCP Dr. Nehemiah Settle ofr [place] as needed should her symptoms reoccur. Patient has verbalized understanding of her post discharge care and f/u needs.  Discharge Orders    Future Orders Please Complete By Expires   Discharge patient      Comments:   To home self care; patient is instructed to call her PCP or our office should her symptoms re-occur.       Medication List     As of 10/02/2012  3:34 PM    TAKE these medications         atorvastatin 20 MG tablet   Commonly known as: LIPITOR   Take 20 mg by mouth daily.      gabapentin 100 MG capsule   Commonly known as: NEURONTIN   Take 100 mg by mouth 3 (three) times daily.      mirabegron ER 25 MG Tb24   Commonly known as: MYRBETRIQ   Take 25 mg by mouth daily.      propranolol ER 120 MG 24 hr capsule   Commonly known as: INDERAL LA   Take  120 mg by mouth daily.      solifenacin 5 MG tablet   Commonly known as: VESICARE   Take 10 mg by mouth daily.           Follow-up Information    Follow up with Katy Apo, MD. (As needed if symptoms worsen. )    Contact information:   [address] [place] [address] [phone]       Follow up with Wynona Luna., MD. (As needed if symptoms worsen)    Contact information:   [address] [place]  [address] [phone]          Signed: Blenda Mounts ACNP [facility] Surgery 10/02/2012, 3:34 PM

## 2012-10-02 NOTE — Discharge Summary (Signed)
Tolerating diet Discharge home  Wilmon Arms. Corliss Skains, MD, [facility] Surgery  10/02/2012 4:05 PM

## 2012-10-02 NOTE — Progress Notes (Signed)
X-rays - no sign of obstruction  Advance diet Possible discharge this afternoon.  Wilmon Arms. Corliss Skains, MD, [facility] Surgery  10/02/2012 12:45 PM

## 2012-10-02 NOTE — Progress Notes (Signed)
Patient ID: Laura Perkins, female   DOB: Jul 01, 1935, 77 y.o.   MRN: [id number]    Subjective: Patient states that she feels good this morning, no further episodes of abdominal pain. No N/V/D, + BS, BM, flatus after suppository.   Objective: Vital signs in last 24 hours: Temp:  [98 F (36.7 C)-98.5 F (36.9 C)] 98.4 F (36.9 C) (11/21 0630) Pulse Rate:  [59-113] 62  (11/21 0630) Resp:  [18-20] 18  (11/21 0630) BP: (135-156)/(74-90) 156/90 mmHg (11/21 0630) SpO2:  [96 %-100 %] 97 % (11/21 0630) Last BM Date: 10/01/12  Intake/Output from previous day:   Intake/Output this shift:    General appearance: alert, cooperative, appears stated age and no distress Chest: CTA Cardiac: RRR Abdomen: soft, non tender, + BS,+ BM , flatus  No N/V/D tolerating clears. Extremities: +pulses, warm to touch, no edema or tenderness.  Lab Results:   [facility] 09/30/12 0502  WBC 10.1  HGB 13.7  HCT 40.8  PLT 230   BMET  Basename 09/30/12 0502  NA 139  K 4.1  CL 104  CO2 20  GLUCOSE 124*  BUN 19  CREATININE 0.85  CALCIUM 10.2   PT/INR No results found for this basename: LABPROT:2,INR:2 in the last 72 hours ABG No results found for this basename: PHART:2,PCO2:2,PO2:2,HCO3:2 in the last 72 hours  Studies/Results: Dg Abd 1 View  10/01/2012  *RADIOLOGY REPORT*  Clinical Data: Followup small bowel obstruction.  Feeling better.  ABDOMEN - 1 VIEW  Comparison: 09/30/2012 CT.  Findings: Residual contrast throughout the colon.  Gas filled prominent size small bowel loops measuring up to 3 cm left upper quadrant.  The full extent of small bowel may be incompletely assessed by plain film examination as majority of small bowel loops were fluid filled on the recent CT.  The possibility of free intraperitoneal air cannot be addressed on a supine view.  IMPRESSION: Persistent abnormal bowel gas pattern.  Please see above.   Original Report Authenticated By: Lacy Duverney, M.D.    Ct Abdomen Pelvis W  Contrast  09/30/2012  *RADIOLOGY REPORT*  Clinical Data: Nausea, vomiting, abdominal pain, history of endometrial carcinoma  CT ABDOMEN AND PELVIS WITH CONTRAST  Technique:  Multidetector CT imaging of the abdomen and pelvis was performed following the standard protocol during bolus administration of intravenous contrast.  Contrast: OMNIPAQUE IOHEXOL 300 MG/ML  SOLN  Comparison: CT abdomen pelvis of 03/28/2012  Findings: The lung bases are clear.  Mild cardiomegaly is stable. The previously described sub centimeter low attenuation structures in the liver are stable and consistent with a benign process.  No new hepatic lesion is seen.  No calcified gallstones are noted. The pancreas is normal in size and the pancreatic duct is not dilated.  The adrenal glands and spleen are unremarkable.  The stomach is distended with contrast material and is unremarkable. The kidneys enhance with no calculus or mass and no hydronephrosis is seen.  The abdominal aorta is normal in caliber.  There are dilated loops of small bowel within the pelvis and lower abdomen consistent with a partial small bowel obstruction.  The distal small bowel is decompressed.  The porta caliber change appears to be within the mid pelvis and there is some thickening of the small bowel mucosa at that site.  Some thickening could be due to radiation enteritis, but edema cannot be excluded.  There is a small amount of interloop fluid within the pelvis.  The urinary bladder is decompressed and cannot be  evaluated.  The patient has previous undergone hysterectomy.  No mass or adenopathy is seen.  There is degenerative disc disease at L5-S1 with a very slight anterolisthesis of L5 on S1 and L4 on L5 which appears degenerative in origin.  IMPRESSION:  1.  Dilated loops of small bowel with apparent transition point in the mid lower pelvis consistent with partial small bowel obstruction. 2.  Some mucosal thickening at that site may indicate radiation  enteritis or possibly edema.  There is a small amount of fluid within the pelvis. 3.  Slight anterolisthesis of L5 on S1 and L4 on L5 most likely degenerative in origin.   Original Report Authenticated By: Dwyane Dee, M.D.     Anti-infectives: Anti-infectives    None      Assessment/Plan:  PSBO secondary to either adhesions or radiation enteritis.  Plan:  1. PSBO appears to be resolving; + BM,flatus no N/V/D.  2. Advance diet 3. Will re-check abdominal film today 4. Encourage ambulation/OOB 5. Probably home in am if she has no issues with diet, or reoccurance of symptoms.   s/p * No surgery found *     LOS: 2 days    Golda Acre [facility] Surgery Pager # [phone]  10/02/2012

## 2012-11-22 ENCOUNTER — Encounter (HOSPITAL_COMMUNITY): Payer: Self-pay | Admitting: Emergency Medicine

## 2012-11-22 ENCOUNTER — Inpatient Hospital Stay (HOSPITAL_COMMUNITY)
Admission: EM | Admit: 2012-11-22 | Discharge: 2012-11-24 | DRG: 389 | Disposition: A | Discharge status: Home / Self Care | Payer: Medicare Other | Attending: Internal Medicine | Admitting: Internal Medicine

## 2012-11-22 ENCOUNTER — Emergency Department (HOSPITAL_COMMUNITY): Payer: Medicare Other

## 2012-11-22 DIAGNOSIS — E7800 Pure hypercholesterolemia, unspecified: Secondary | ICD-10-CM | POA: Diagnosis present

## 2012-11-22 DIAGNOSIS — K56609 Unspecified intestinal obstruction, unspecified as to partial versus complete obstruction: Principal | ICD-10-CM | POA: Diagnosis present

## 2012-11-22 DIAGNOSIS — Z8719 Personal history of other diseases of the digestive system: Secondary | ICD-10-CM

## 2012-11-22 DIAGNOSIS — K520 Gastroenteritis and colitis due to radiation: Secondary | ICD-10-CM | POA: Diagnosis present

## 2012-11-22 DIAGNOSIS — C55 Malignant neoplasm of uterus, part unspecified: Secondary | ICD-10-CM

## 2012-11-22 DIAGNOSIS — Z90710 Acquired absence of both cervix and uterus: Secondary | ICD-10-CM

## 2012-11-22 DIAGNOSIS — I10 Essential (primary) hypertension: Secondary | ICD-10-CM | POA: Diagnosis present

## 2012-11-22 DIAGNOSIS — Z79899 Other long term (current) drug therapy: Secondary | ICD-10-CM

## 2012-11-22 DIAGNOSIS — Z9221 Personal history of antineoplastic chemotherapy: Secondary | ICD-10-CM

## 2012-11-22 DIAGNOSIS — Y842 Radiological procedure and radiotherapy as the cause of abnormal reaction of the patient, or of later complication, without mention of misadventure at the time of the procedure: Secondary | ICD-10-CM | POA: Diagnosis present

## 2012-11-22 DIAGNOSIS — Z8542 Personal history of malignant neoplasm of other parts of uterus: Secondary | ICD-10-CM

## 2012-11-22 DIAGNOSIS — Z923 Personal history of irradiation: Secondary | ICD-10-CM

## 2012-11-22 HISTORY — DX: Personal history of malignant neoplasm of other parts of uterus: Z85.42

## 2012-11-22 LAB — COMPREHENSIVE METABOLIC PANEL
Alkaline Phosphatase: 76 U/L (ref 39–117)
BUN: 13 mg/dL (ref 6–23)
Chloride: 100 mEq/L (ref 96–112)
GFR calc Af Amer: 76 mL/min — ABNORMAL LOW (ref >90)
Glucose, Bld: 134 mg/dL — ABNORMAL HIGH (ref 70–99)
Potassium: 4.1 mEq/L (ref 3.5–5.1)
Total Bilirubin: 0.4 mg/dL (ref 0.3–1.2)

## 2012-11-22 LAB — URINALYSIS, ROUTINE W REFLEX MICROSCOPIC
Bilirubin Urine: NEGATIVE
Ketones, ur: NEGATIVE mg/dL
Nitrite: NEGATIVE
Protein, ur: NEGATIVE mg/dL
Urobilinogen, UA: 0.2 mg/dL (ref 0.0–1.0)

## 2012-11-22 LAB — CBC
HCT: 40.4 % (ref 36.0–46.0)
Hemoglobin: 13.1 g/dL (ref 12.0–15.0)
MCHC: 32.4 g/dL (ref 30.0–36.0)
MCV: 99.5 fL (ref 78.0–100.0)
WBC: 9.6 10*3/uL (ref 4.0–10.5)

## 2012-11-22 LAB — LIPASE, BLOOD: Lipase: 55 U/L (ref 11–59)

## 2012-11-22 LAB — TSH: TSH: 2.328 u[IU]/mL (ref 0.350–4.500)

## 2012-11-22 MED ORDER — ONDANSETRON HCL 4 MG/2ML IJ SOLN
4.0000 mg | Freq: Once | INTRAMUSCULAR | Status: AC
  Administered 2012-11-22: 4 mg via INTRAVENOUS
  Filled 2012-11-22: qty 2

## 2012-11-22 MED ORDER — ATORVASTATIN CALCIUM 10 MG PO TABS
10.0000 mg | ORAL_TABLET | ORAL | Status: DC
  Administered 2012-11-22 – 2012-11-24 (×2): 10 mg via ORAL
  Filled 2012-11-22 (×2): qty 1

## 2012-11-22 MED ORDER — DOCUSATE SODIUM 100 MG PO CAPS
100.0000 mg | ORAL_CAPSULE | Freq: Two times a day (BID) | ORAL | Status: DC
  Filled 2012-11-22 (×2): qty 1

## 2012-11-22 MED ORDER — SODIUM CHLORIDE 0.9 % IV SOLN
INTRAVENOUS | Status: DC
  Administered 2012-11-22: 04:00:00 via INTRAVENOUS

## 2012-11-22 MED ORDER — ONDANSETRON HCL 4 MG PO TABS: 4.0000 mg | ORAL_TABLET | Freq: Four times a day (QID) | ORAL | Status: DC | PRN

## 2012-11-22 MED ORDER — ATORVASTATIN CALCIUM 20 MG PO TABS
20.0000 mg | ORAL_TABLET | ORAL | Status: DC
  Administered 2012-11-23: 20 mg via ORAL
  Filled 2012-11-22: qty 1

## 2012-11-22 MED ORDER — ONDANSETRON HCL 4 MG/2ML IJ SOLN: 4.0000 mg | Freq: Four times a day (QID) | INTRAMUSCULAR | Status: DC | PRN

## 2012-11-22 MED ORDER — ASPIRIN 81 MG PO CHEW
81.0000 mg | CHEWABLE_TABLET | Freq: Every morning | ORAL | Status: DC
  Administered 2012-11-22 – 2012-11-24 (×3): 81 mg via ORAL
  Filled 2012-11-22 (×3): qty 1

## 2012-11-22 MED ORDER — IOHEXOL 300 MG/ML  SOLN
100.0000 mL | Freq: Once | INTRAMUSCULAR | Status: AC | PRN
  Administered 2012-11-22: 100 mL via INTRAVENOUS

## 2012-11-22 MED ORDER — SODIUM CHLORIDE 0.9 % IV SOLN
INTRAVENOUS | Status: AC
  Administered 2012-11-22: 07:00:00 via INTRAVENOUS

## 2012-11-22 MED ORDER — POLYETHYLENE GLYCOL 3350 17 G PO PACK
17.0000 g | PACK | Freq: Every day | ORAL | Status: DC
  Administered 2012-11-23 – 2012-11-24 (×2): 17 g via ORAL
  Filled 2012-11-22 (×3): qty 1

## 2012-11-22 MED ORDER — OXYMETAZOLINE HCL 0.05 % NA SOLN
NASAL | Status: AC
  Administered 2012-11-22: 2
  Filled 2012-11-22: qty 15

## 2012-11-22 MED ORDER — LIDOCAINE HCL 2 % EX GEL
CUTANEOUS | Status: AC
  Administered 2012-11-22: 10
  Filled 2012-11-22: qty 10

## 2012-11-22 MED ORDER — GABAPENTIN 100 MG PO CAPS
100.0000 mg | ORAL_CAPSULE | Freq: Three times a day (TID) | ORAL | Status: DC
  Administered 2012-11-22 – 2012-11-24 (×6): 100 mg via ORAL
  Filled 2012-11-22 (×9): qty 1

## 2012-11-22 MED ORDER — PROPRANOLOL HCL ER 120 MG PO CP24
120.0000 mg | ORAL_CAPSULE | Freq: Every morning | ORAL | Status: DC
  Administered 2012-11-22 – 2012-11-24 (×3): 120 mg via ORAL
  Filled 2012-11-22 (×3): qty 1

## 2012-11-22 MED ORDER — DOCUSATE SODIUM 100 MG PO CAPS
100.0000 mg | ORAL_CAPSULE | Freq: Two times a day (BID) | ORAL | Status: DC
  Administered 2012-11-23 – 2012-11-24 (×3): 100 mg via ORAL
  Filled 2012-11-22 (×4): qty 1

## 2012-11-22 MED ORDER — FENTANYL CITRATE 0.05 MG/ML IJ SOLN
50.0000 ug | INTRAMUSCULAR | Status: DC | PRN
  Administered 2012-11-22: 50 ug via INTRAVENOUS
  Filled 2012-11-22: qty 2

## 2012-11-22 MED ORDER — DEXTROSE IN LACTATED RINGERS 5 % IV SOLN
INTRAVENOUS | Status: DC
  Administered 2012-11-22 – 2012-11-24 (×3): via INTRAVENOUS
  Filled 2012-11-22: qty 1000

## 2012-11-22 MED ORDER — ONDANSETRON HCL 4 MG/2ML IJ SOLN: 4.0000 mg | Freq: Three times a day (TID) | INTRAMUSCULAR | Status: DC | PRN

## 2012-11-22 MED ORDER — HEPARIN SODIUM (PORCINE) 5000 UNIT/ML IJ SOLN
5000.0000 [IU] | Freq: Three times a day (TID) | INTRAMUSCULAR | Status: DC
  Administered 2012-11-22 – 2012-11-24 (×8): 5000 [IU] via SUBCUTANEOUS
  Filled 2012-11-22 (×10): qty 1

## 2012-11-22 MED ORDER — CHLORHEXIDINE GLUCONATE 0.12 % MT SOLN
15.0000 mL | Freq: Two times a day (BID) | OROMUCOSAL | Status: DC
  Administered 2012-11-22 – 2012-11-24 (×4): 15 mL via OROMUCOSAL
  Filled 2012-11-22 (×6): qty 15

## 2012-11-22 MED ORDER — MIRABEGRON ER 25 MG PO TB24
25.0000 mg | ORAL_TABLET | Freq: Every day | ORAL | Status: DC
  Administered 2012-11-22 – 2012-11-24 (×3): 25 mg via ORAL
  Filled 2012-11-22 (×3): qty 1

## 2012-11-22 NOTE — ED Notes (Signed)
Pt alert, arrives from home, c/o abd pain, onset several weeks ago, seen and treated from SBO, resp even unlabored, skin pwd, describes pain as sharp non radiating

## 2012-11-22 NOTE — Progress Notes (Signed)
[facility] PROGRESS NOTE  TANYAH DEBRUYNE JWJ:[id number] DOB: [birth year] DOA: 11/22/2012 PCP: No primary provider on file.  Brief narrative: 78 y.o. female with history of uterine cancer, s/p hysterectomy and radiation therapy, HTN, hypercholesterolemia, hx of SBO 09/2012 who presented with one day history of abdominal discomfort with distention and nausea. She had similar presentation in 09/2012 and CT at that time showed enteritis and SBO with transitional point. She was treated conservatively under Dr Derl Barrow service, and had spontaneous resolution. Evaluation in the ER included a abdominal CT with constrast which showed SBO with transitional point and inflammation at the distal small bowel suggestive of edema or enteritis. Her lab work up is unremarkable. Patient has NG tube for decompression which seems to be improving the pain, distention and nausea.  Assessment/Plan:  Principal Problem:  *SBO (small bowel obstruction)  Will continue supportive care with IV fluids, NPO and analgesia  Abdominal exam is unremarkable; no distention  Continue NG tube decompression for now and we will repeat Abdominal X ray in am to see if SBO resolving although clinically SBO seems to be improving.   Code Status: full code Family Communication: no family at bedside Disposition Plan: home when stable  Manson Passey, MD  [facility] Pager [phone]  If 7PM-7AM, please contact night-coverage [email or web address] Password [email or web address] 11/22/2012, 11:57 AM   LOS: 0 days   Consultants:  Surgery  Procedures:  NG tube decompression 11/22/2012  Antibiotics:  None   HPI/Subjective: No acute overnight events.  Objective: Filed Vitals:   11/22/12 0317 11/22/12 0713 11/22/12 0949  BP: 138/66 136/70 97/57  Pulse: 60 59 58  Temp: 97.5 F (36.4 C) 97.9 F (36.6 C)   TempSrc: Oral Oral   Resp: 16 16 16   SpO2: 100% 100% 97%   No intake or output data in the 24 hours ending 11/22/12  1157  Exam:   General:  Pt is alert, follows commands appropriately, not in acute distress  Cardiovascular: Regular rate and rhythm, S1/S2, no murmurs, no rubs, no gallops  Respiratory: Clear to auscultation bilaterally, no wheezing, no crackles, no rhonchi  Abdomen: Soft, non tender, non distended, bowel sounds present, no guarding  Extremities: No edema, pulses DP and PT palpable bilaterally  Neuro: Grossly nonfocal  Data Reviewed: Basic Metabolic Panel:  Lab 11/22/12 5784  NA 137  K 4.1  CL 100  CO2 30  GLUCOSE 134*  BUN 13  CREATININE 0.85  CALCIUM 9.8  MG --  PHOS --   Liver Function Tests:  Lab 11/22/12 0350  AST 23  ALT 8  ALKPHOS 76  BILITOT 0.4  PROT 6.9  ALBUMIN 3.7    Lab 11/22/12 0350  LIPASE 55  AMYLASE --   No results found for this basename: AMMONIA:5 in the last 168 hours CBC:  Lab 11/22/12 0350  WBC 9.6  NEUTROABS --  HGB 13.1  HCT 40.4  MCV 99.5  PLT 321    Studies: Ct Abdomen Pelvis W Contrast 11/22/2012  *  IMPRESSION: Transition in the small bowel loops in the lower pelvis suggesting small bowel obstruction.  Mild wall thickening within the distal small bowel loops at the level of transition suggesting enteritis or edema.      Scheduled Meds:   . sodium chloride   Intravenous STAT  . aspirin  81 mg Oral q morning - 10a  . atorvastatin  10 mg Oral QODAY  . atorvastatin  20 mg Oral QODAY  . chlorhexidine  15 mL Mouth/Throat BID  .  gabapentin  100 mg Oral TID  . heparin  5,000 Units Subcutaneous Q8H  . mirabegron ER  25 mg Oral Daily  . propranolol ER  120 mg Oral q morning - 10a   Continuous Infusions:   . dextrose 5% lactated ringers

## 2012-11-22 NOTE — H&P (Signed)
[facility] History and Physical  Laura Perkins BJY:[id number] DOB: 1934/11/24    PCP:  None.  Chief Complaint: abdominal distention, discomfort, nausea, vomiting for one day.  HPI: Laura Perkins is an 78 y.o. female with hx of uterine cancer, s/p hysterectomy and radiation therapy, HTN, hypercholesterolemia, hx of SBO 11/13, presents with one day hx of abdominal discomfort with distention and nausea.  She had similar presentation in 11/13 and CT at that time showed enteritis and SBO with transitional point.  She was treated conservatively under Dr Derl Barrow service, and had spontaneous resolution.  She had normal BM and passed flatus yesterday morning.  Evaluation in the ER included a abdominal CT with constrast which showed SBO with transitional point and inflammation at the distal small bowel suggestive of edema or enteritis.  Her WBC is normal and she has normal Hb.  Her LFTs were normal along with her renal fx tests and electrolytes.  Hospitalist was asked to admit her for SBO.  Surgical consulted with Dr Abbey Chatters was done and he will follow and make further recommendation.  Rewiew of Systems:  Constitutional: Negative for malaise, fever and chills. No significant weight loss or weight gain Eyes: Negative for eye pain, redness and discharge, diplopia, visual changes, or flashes of light. ENMT: Negative for ear pain, hoarseness, nasal congestion, sinus pressure and sore throat. No headaches; tinnitus, drooling, or problem swallowing. Cardiovascular: Negative for chest pain, palpitations, diaphoresis, dyspnea and peripheral edema. ; No orthopnea, PND Respiratory: Negative for cough, hemoptysis, wheezing and stridor. No pleuritic chestpain. Gastrointestinal: Negative for  diarrhea, constipation,  melena, blood in stool, hematemesis, jaundice and rectal bleeding.    Genitourinary: Negative for frequency, dysuria, incontinence,flank pain and hematuria; Musculoskeletal: Negative for  back pain and neck pain. Negative for swelling and trauma.;  Skin: . Negative for pruritus, rash, abrasions, bruising and skin lesion.; ulcerations Neuro: Negative for headache, lightheadedness and neck stiffness. Negative for weakness, altered level of consciousness , altered mental status, extremity weakness, burning feet, involuntary movement, seizure and syncope.  Psych: negative for anxiety, depression, insomnia, tearfulness, panic attacks, hallucinations, paranoia, suicidal or homicidal ideation    Past Medical History  Diagnosis Date  . Hypertension   . Overactive bladder   . Cancer     carcinoma sarcoma    Past Surgical History  Procedure Date  . Abdominal hysterectomy   . Breast biopsy   . Ankle surgery     Medications:  HOME MEDS: Prior to Admission medications   Medication Sig Start Date End Date Taking? Authorizing Provider  aspirin 81 MG chewable tablet Chew 81 mg by mouth every morning.   Yes Historical Provider, MD  atorvastatin (LIPITOR) 20 MG tablet Take 10-20 mg by mouth daily. Alternate 10 mg (1/2 tablet) with 20 mg (1 tablet ) every other day 07/28/12  Yes Historical Provider, MD  calcium-vitamin D (OSCAL WITH D) 500-200 MG-UNIT per tablet Take 2 tablets by mouth every morning.   Yes Historical Provider, MD  chlorhexidine (PERIDEX) 0.12 % solution Use as directed 15 mLs in the mouth or throat 2 (two) times daily.   Yes Historical Provider, MD  gabapentin (NEURONTIN) 100 MG capsule Take 100 mg by mouth 3 (three) times daily.  08/13/12  Yes Historical Provider, MD  HYDROcodone-acetaminophen (NORCO/VICODIN) 5-325 MG per tablet Take 1 tablet by mouth every 4 (four) hours as needed. For pain   Yes Historical Provider, MD  mirabegron ER (MYRBETRIQ) 25 MG TB24 Take 25 mg by mouth daily.  Yes Historical Provider, MD  Multiple Vitamin (MULTIVITAMIN WITH MINERALS) TABS Take 1 tablet by mouth daily.   Yes Historical Provider, MD  propranolol ER (INDERAL LA) 120 MG 24 hr  capsule Take 120 mg by mouth every morning.  08/12/12  Yes Historical Provider, MD  clindamycin (CLEOCIN) 150 MG capsule Take 150 mg by mouth 3 (three) times daily. 11/13/12 11/23/12  Historical Provider, MD     Allergies:  Allergies  Allergen Reactions  . Sulfa Antibiotics Anaphylaxis    Social History:   reports that she has never smoked. She has never used smokeless tobacco. She reports that she does not drink alcohol or use illicit drugs.  Family History: No family history on file.   Physical Exam: Filed Vitals:   11/22/12 0317  BP: 138/66  Pulse: 60  Temp: 97.5 F (36.4 C)  TempSrc: Oral  Resp: 16  SpO2: 100%   Blood pressure 138/66, pulse 60, temperature 97.5 F (36.4 C), temperature source Oral, resp. rate 16, SpO2 100.00%.  GEN:  Pleasant  patient lying in the stretcher in no acute distress; cooperative with exam. PSYCH:  alert and oriented x4; does not appear anxious or depressed; affect is appropriate. HEENT: Mucous membranes pink and anicteric; PERRLA; EOM intact; no cervical lymphadenopathy nor thyromegaly or carotid bruit; no JVD; There were no stridor. Neck is very supple. Breasts:: Not examined CHEST WALL: No tenderness CHEST: Normal respiration, clear to auscultation bilaterally.  HEART: Regular rate and rhythm.  There are no murmur, rub, or gallops.   BACK: No kyphosis or scoliosis; no CVA tenderness ABDOMEN: soft and non-tender; no masses, no organomegaly, normal abdominal bowel sounds; no pannus; no intertriginous candida. There is no rebound and no distention. Rectal Exam: Not done EXTREMITIES: No bone or joint deformity; age-appropriate arthropathy of the hands and knees; no edema; no ulcerations.  There is no calf tenderness. Genitalia: not examined PULSES: 2+ and symmetric SKIN: Normal hydration no rash or ulceration CNS: Cranial nerves 2-12 grossly intact no focal lateralizing neurologic deficit.  Speech is fluent; uvula elevated with phonation,  facial symmetry and tongue midline. DTR are normal bilaterally, cerebella exam is intact, barbinski is negative and strengths are equaled bilaterally.  No sensory loss.   Labs on Admission:  Basic Metabolic Panel:  Lab 11/22/12 9528  NA 137  K 4.1  CL 100  CO2 30  GLUCOSE 134*  BUN 13  CREATININE 0.85  CALCIUM 9.8  MG --  PHOS --   Liver Function Tests:  Lab 11/22/12 0350  AST 23  ALT 8  ALKPHOS 76  BILITOT 0.4  PROT 6.9  ALBUMIN 3.7    Lab 11/22/12 0350  LIPASE 55  AMYLASE --   No results found for this basename: AMMONIA:5 in the last 168 hours CBC:  Lab 11/22/12 0350  WBC 9.6  NEUTROABS --  HGB 13.1  HCT 40.4  MCV 99.5  PLT 321   Cardiac Enzymes: No results found for this basename: CKTOTAL:5,CKMB:5,CKMBINDEX:5,TROPONINI:5 in the last 168 hours  CBG: No results found for this basename: GLUCAP:5 in the last 168 hours   Radiological Exams on Admission: Ct Abdomen Pelvis W Contrast  11/22/2012  *RADIOLOGY REPORT*  Clinical Data: Abdominal pain.  History of uterine cancer and small bowel obstruction.  CT ABDOMEN AND PELVIS WITH CONTRAST  Technique:  Multidetector CT imaging of the abdomen and pelvis was performed following the standard protocol during bolus administration of intravenous contrast.  Contrast: OMNIPAQUE IOHEXOL 300 MG/ML  SOLN  Comparison: 09/30/2012  Findings: Lung bases are clear. Heart is borderline enlarged.  No effusions.  Stomach is distended with food material and contrast.  Tiny low density lesions scattered throughout the liver, likely small cysts. These are unchanged.  Gallbladder, spleen, pancreas, adrenals and kidneys unremarkable.  As seen on the prior CT, there is a caliber change within the small bowel loops in the lower pelvis.  Distal small bowel is decompressed.  A segment of small bowel at the transition point demonstrates mild wall thickening suggesting enteritis.  Small amount of free fluid adjacent to the liver and in the  pelvis.  No free air or adenopathy.  Prior hysterectomy.  No adnexal masses.  Urinary bladder is grossly unremarkable.  No acute bony abnormality.  Degenerative changes in the lumbar spine.   IMPRESSION: Transition in the small bowel loops in the lower pelvis suggesting small bowel obstruction.  Mild wall thickening within the distal small bowel loops at the level of transition suggesting enteritis or edema.   Original Report Authenticated By: Charlett Nose, M.D.      Assessment/Plan Present on Admission:  . SBO (small bowel obstruction) . Hypercholesterolemia Enteritis Hx of uterine CA.  PLAN:  Her clinical presentation is not typical of radiation enteritis, but more suspicious of adhesion causing SBO.  She doesn't usually have diarrhea, but has normal BM up to yesterday morning.  In any event, will admit her for SBO, give IVF and small amount of pain medication, continue her NGT and hopefully it will resolve.  Surgery was consulted and will follow.  I have continued all her home meds.  She is stable, full code, and will be admitted to [facility] service.  Thank you for allowing [place] to partake in the care of your lovely patient.    Other plans as per orders.  Code Status: FULL Unk Lightning, MD. [facility] Pager [phone] 7pm to 7am.  11/22/2012, 6:45 AM

## 2012-11-22 NOTE — ED Provider Notes (Addendum)
History     CSN: [id number]  Arrival date & time 11/22/12  4098   First MD Initiated Contact with Patient 11/22/12 [phone]      Chief Complaint  Patient presents with  . Abdominal Pain    (Consider location/radiation/quality/duration/timing/severity/associated sxs/prior treatment) HPI HX per PT< onset last night around 6pm, cramping pain, across lower ABD similar to prior bowel obstruction. Has associated N/V today, NB/ NB emesis. Last BM normal yesterday. No trauma. No rash. No radiation of sharp/ cramping pain. MOD in severity. On ABx for dental infection, dental pain improved.no F/C Past Medical History  Diagnosis Date  . Hypertension   . Overactive bladder   . Cancer     carcinoma sarcoma    Past Surgical History  Procedure Date  . Abdominal hysterectomy   . Breast biopsy   . Ankle surgery     No family history on file.  History  Substance Use Topics  . Smoking status: Never Smoker   . Smokeless tobacco: Never Used  . Alcohol Use: No    OB History    Grav Para Term Preterm Abortions TAB SAB Ect Mult Living                  Review of Systems  Constitutional: Negative for fever and chills.  HENT: Negative for neck pain and neck stiffness.   Eyes: Negative for pain.  Respiratory: Negative for shortness of breath.   Cardiovascular: Negative for chest pain.  Gastrointestinal: Positive for nausea, vomiting and abdominal pain.  Genitourinary: Negative for dysuria.  Musculoskeletal: Negative for back pain.  Skin: Negative for rash.  Neurological: Negative for headaches.  All other systems reviewed and are negative.    Allergies  Sulfa antibiotics  Home Medications   Current Outpatient Rx  Name  Route  Sig  Dispense  Refill  . ATORVASTATIN CALCIUM 20 MG PO TABS   Oral   Take 20 mg by mouth daily.          Marland Kitchen GABAPENTIN 100 MG PO CAPS   Oral   Take 100 mg by mouth 3 (three) times daily.          Marland Kitchen MIRABEGRON ER 25 MG PO TB24   Oral   Take 25 mg  by mouth daily.         Marland Kitchen PROPRANOLOL HCL ER 120 MG PO CP24   Oral   Take 120 mg by mouth daily.          Marland Kitchen SOLIFENACIN SUCCINATE 5 MG PO TABS   Oral   Take 10 mg by mouth daily.           BP 138/66  Pulse 60  Temp 97.5 F (36.4 C) (Oral)  Resp 16  SpO2 100%  Physical Exam  Nursing note and vitals reviewed. Constitutional: She is oriented to person, place, and time. She appears well-developed and well-nourished.  HENT:  Head: Normocephalic and atraumatic.  Eyes: EOM are normal. Pupils are equal, round, and reactive to light. No scleral icterus.  Neck: Normal range of motion. Neck supple.  Cardiovascular: Normal rate, normal heart sounds and intact distal pulses.   Pulmonary/Chest: Effort normal and breath sounds normal. No respiratory distress. She exhibits no tenderness.  Abdominal: Soft. There is no rebound and no guarding.       Dec bowel sounds with lower ABd tenderness, no distention  Musculoskeletal: Normal range of motion. She exhibits no edema.  Neurological: She is alert and oriented to person, place,  and time.  Skin: Skin is warm and dry.    ED Course  Procedures (including critical care time)  Results for orders placed during the hospital encounter of 11/22/12  CBC      Component Value Range   WBC 9.6  4.0 - 10.5 K/uL   RBC 4.06  3.87 - 5.11 MIL/uL   Hemoglobin 13.1  12.0 - 15.0 g/dL   HCT 16.1  09.6 - 04.5 %   MCV 99.5  78.0 - 100.0 fL   MCH 32.3  26.0 - 34.0 pg   MCHC 32.4  30.0 - 36.0 g/dL   RDW 40.9  81.1 - 91.4 %   Platelets 321  150 - 400 K/uL  COMPREHENSIVE METABOLIC PANEL      Component Value Range   Sodium 137  135 - 145 mEq/L   Potassium 4.1  3.5 - 5.1 mEq/L   Chloride 100  96 - 112 mEq/L   CO2 30  19 - 32 mEq/L   Glucose, Bld 134 (*) 70 - 99 mg/dL   BUN 13  6 - 23 mg/dL   Creatinine, Ser 7.82  0.50 - 1.10 mg/dL   Calcium 9.8  8.4 - 95.6 mg/dL   Total Protein 6.9  6.0 - 8.3 g/dL   Albumin 3.7  3.5 - 5.2 g/dL   AST 23  0 - 37 U/L    ALT 8  0 - 35 U/L   Alkaline Phosphatase 76  39 - 117 U/L   Total Bilirubin 0.4  0.3 - 1.2 mg/dL   GFR calc non Af Amer 65 (*) >90 mL/min   GFR calc Af Amer 76 (*) >90 mL/min  LIPASE, BLOOD      Component Value Range   Lipase 55  11 - 59 U/L   Ct Abdomen Pelvis W Contrast  11/22/2012  *RADIOLOGY REPORT*  Clinical Data: Abdominal pain.  History of uterine cancer and small bowel obstruction.  CT ABDOMEN AND PELVIS WITH CONTRAST  Technique:  Multidetector CT imaging of the abdomen and pelvis was performed following the standard protocol during bolus administration of intravenous contrast.  Contrast: OMNIPAQUE IOHEXOL 300 MG/ML  SOLN  Comparison: 09/30/2012  Findings: Lung bases are clear. Heart is borderline enlarged.  No effusions.  Stomach is distended with food material and contrast.  Tiny low density lesions scattered throughout the liver, likely small cysts. These are unchanged.  Gallbladder, spleen, pancreas, adrenals and kidneys unremarkable.  As seen on the prior CT, there is a caliber change within the small bowel loops in the lower pelvis.  Distal small bowel is decompressed.  A segment of small bowel at the transition point demonstrates mild wall thickening suggesting enteritis.  Small amount of free fluid adjacent to the liver and in the pelvis.  No free air or adenopathy.  Prior hysterectomy.  No adnexal masses.  Urinary bladder is grossly unremarkable.  No acute bony abnormality.  Degenerative changes in the lumbar spine.   IMPRESSION: Transition in the small bowel loops in the lower pelvis suggesting small bowel obstruction.  Mild wall thickening within the distal small bowel loops at the level of transition suggesting enteritis or edema.   Original Report Authenticated By: Charlett Nose, M.D.     5:46 AM NGT, d/w DR Conley Rolls, will admit, [facility] consulted, Dr Abbey Chatters - [facility] to follow.  IVFs, IV fentanyl and zofran, NPO  MDM  SBO with h/o same. CT scan, labs, IV narcotics, NPO ,NGT, MED  admit, [facility] consult.  Sunnie Nielsen, MD 11/22/12 1610  Sunnie Nielsen, MD 11/22/12 [phone]

## 2012-11-22 NOTE — ED Notes (Signed)
Patient unable to void at this time

## 2012-11-22 NOTE — Consult Note (Signed)
Reason for Consult:  Partial SBO Referring Physician: Dr. [name] is an 78 y.o. female.  HPI: She had the onset yesterday of cramping abdominal pain followed by multiple loose bowel movements. This subsequently was followed by nausea and vomiting at which time she presented to the hospital. She underwent a CT scan demonstrating some mildly dilated loops of small bowel down to a transition point in the right lower abdomen where an area of thickened bile was noted. There is a small  amount of free fluid around this area.  She had a normal bowel movement yesterday before all this started. Of note was that she had a similar event approximately 2 months ago but had more nausea and vomiting with this. This resolved without operative intervention. She has had a vaginal hysterectomy at which time she was discovered to have uterine cancer.  She subsequently had chemotherapy and radiation treatment in 2011.  Past Medical History  Diagnosis Date  . Hypertension   . Overactive bladder   . Cancer     carcinoma sarcoma    Past Surgical History  Procedure Date  . Abdominal hysterectomy   . Breast biopsy   . Ankle surgery     No family history on file.  Social History:  reports that she has never smoked. She has never used smokeless tobacco. She reports that she does not drink alcohol or use illicit drugs.  Allergies:  Allergies  Allergen Reactions  . Sulfa Antibiotics Anaphylaxis   Prior to Admission medications   Medication Sig Start Date End Date Taking? Authorizing Provider  aspirin 81 MG chewable tablet Chew 81 mg by mouth every morning.   Yes Historical Provider, MD  atorvastatin (LIPITOR) 20 MG tablet Take 10-20 mg by mouth daily. Alternate 10 mg (1/2 tablet) with 20 mg (1 tablet ) every other day 07/28/12  Yes Historical Provider, MD  calcium-vitamin D (OSCAL WITH D) 500-200 MG-UNIT per tablet Take 2 tablets by mouth every morning.   Yes Historical Provider, MD  chlorhexidine  (PERIDEX) 0.12 % solution Use as directed 15 mLs in the mouth or throat 2 (two) times daily.   Yes Historical Provider, MD  gabapentin (NEURONTIN) 100 MG capsule Take 100 mg by mouth 3 (three) times daily.  08/13/12  Yes Historical Provider, MD  HYDROcodone-acetaminophen (NORCO/VICODIN) 5-325 MG per tablet Take 1 tablet by mouth every 4 (four) hours as needed. For pain   Yes Historical Provider, MD  mirabegron ER (MYRBETRIQ) 25 MG TB24 Take 25 mg by mouth daily.   Yes Historical Provider, MD  Multiple Vitamin (MULTIVITAMIN WITH MINERALS) TABS Take 1 tablet by mouth daily.   Yes Historical Provider, MD  propranolol ER (INDERAL LA) 120 MG 24 hr capsule Take 120 mg by mouth every morning.  08/12/12  Yes Historical Provider, MD  clindamycin (CLEOCIN) 150 MG capsule Take 150 mg by mouth 3 (three) times daily. 11/13/12 11/23/12  Historical Provider, MD      Results for orders placed during the hospital encounter of 11/22/12 (from the past 48 hour(s))  CBC     Status: Normal   Collection Time   11/22/12  3:50 AM      Component Value Range Comment   WBC 9.6  4.0 - 10.5 K/uL    RBC 4.06  3.87 - 5.11 MIL/uL    Hemoglobin 13.1  12.0 - 15.0 g/dL    HCT 16.1  09.6 - 04.5 %    MCV 99.5  78.0 - 100.0  fL    MCH 32.3  26.0 - 34.0 pg    MCHC 32.4  30.0 - 36.0 g/dL    RDW 16.1  09.6 - 04.5 %    Platelets 321  150 - 400 K/uL   COMPREHENSIVE METABOLIC PANEL     Status: Abnormal   Collection Time   11/22/12  3:50 AM      Component Value Range Comment   Sodium 137  135 - 145 mEq/L    Potassium 4.1  3.5 - 5.1 mEq/L    Chloride 100  96 - 112 mEq/L    CO2 30  19 - 32 mEq/L    Glucose, Bld 134 (*) 70 - 99 mg/dL    BUN 13  6 - 23 mg/dL    Creatinine, Ser 4.09  0.50 - 1.10 mg/dL    Calcium 9.8  8.4 - 81.1 mg/dL    Total Protein 6.9  6.0 - 8.3 g/dL    Albumin 3.7  3.5 - 5.2 g/dL    AST 23  0 - 37 U/L    ALT 8  0 - 35 U/L    Alkaline Phosphatase 76  39 - 117 U/L    Total Bilirubin 0.4  0.3 - 1.2 mg/dL    GFR  calc non Af Amer 65 (*) >90 mL/min    GFR calc Af Amer 76 (*) >90 mL/min   LIPASE, BLOOD     Status: Normal   Collection Time   11/22/12  3:50 AM      Component Value Range Comment   Lipase 55  11 - 59 U/L   URINALYSIS, ROUTINE W REFLEX MICROSCOPIC     Status: Abnormal   Collection Time   11/22/12  5:27 AM      Component Value Range Comment   Color, Urine YELLOW  YELLOW    APPearance CLEAR  CLEAR    Specific Gravity, Urine >1.046 (*) 1.005 - 1.030    pH 8.5 (*) 5.0 - 8.0    Glucose, UA NEGATIVE  NEGATIVE mg/dL    Hgb urine dipstick NEGATIVE  NEGATIVE    Bilirubin Urine NEGATIVE  NEGATIVE    Ketones, ur NEGATIVE  NEGATIVE mg/dL    Protein, ur NEGATIVE  NEGATIVE mg/dL    Urobilinogen, UA 0.2  0.0 - 1.0 mg/dL    Nitrite NEGATIVE  NEGATIVE    Leukocytes, UA NEGATIVE  NEGATIVE MICROSCOPIC NOT DONE ON URINES WITH NEGATIVE PROTEIN, BLOOD, LEUKOCYTES, NITRITE, OR GLUCOSE <1000 mg/dL.    Ct Abdomen Pelvis W Contrast  11/22/2012  *RADIOLOGY REPORT*  Clinical Data: Abdominal pain.  History of uterine cancer and small bowel obstruction.  CT ABDOMEN AND PELVIS WITH CONTRAST  Technique:  Multidetector CT imaging of the abdomen and pelvis was performed following the standard protocol during bolus administration of intravenous contrast.  Contrast: OMNIPAQUE IOHEXOL 300 MG/ML  SOLN  Comparison: 09/30/2012  Findings: Lung bases are clear. Heart is borderline enlarged.  No effusions.  Stomach is distended with food material and contrast.  Tiny low density lesions scattered throughout the liver, likely small cysts. These are unchanged.  Gallbladder, spleen, pancreas, adrenals and kidneys unremarkable.  As seen on the prior CT, there is a caliber change within the small bowel loops in the lower pelvis.  Distal small bowel is decompressed.  A segment of small bowel at the transition point demonstrates mild wall thickening suggesting enteritis.  Small amount of free fluid adjacent to the liver and in the  pelvis.  No free air or adenopathy.  Prior hysterectomy.  No adnexal masses.  Urinary bladder is grossly unremarkable.  No acute bony abnormality.  Degenerative changes in the lumbar spine.   IMPRESSION: Transition in the small bowel loops in the lower pelvis suggesting small bowel obstruction.  Mild wall thickening within the distal small bowel loops at the level of transition suggesting enteritis or edema.   Original Report Authenticated By: Charlett Nose, M.D.     Review of Systems  Constitutional: Negative for fever and chills.  Respiratory: Negative for shortness of breath.   Cardiovascular: Positive for chest pain.  Gastrointestinal: Positive for nausea, vomiting, abdominal pain and diarrhea.   Blood pressure 136/70, pulse 59, temperature 97.9 F (36.6 C), temperature source Oral, resp. rate 16, SpO2 100.00%. Physical Exam  Constitutional: She appears well-developed and well-nourished. No distress.  HENT:       Nasogastric tube is in place.   Cardiovascular: Normal rate and regular rhythm.   GI: Soft. She exhibits distension (mild). She exhibits no mass. There is no tenderness.       Hypoactive bowel sounds.    Assessment/Plan: Partial small bowel obstruction potentially secondary to infectious versus radiation enteritis. All so could be secondary to pelvic adhesions from her vaginal hysterectomy. She had a similar episode 2 months ago that resolved with nonoperative management.  Plan: Continue nasogastric tube decompression. Mobilized. Will check abdominal films in the morning. If it does not resolve then she may need exploratory laparotomy. If it does resolve but she continues to have recurrent episodes in the future she may need exploratory laparotomy at that time.  Laura Perkins J 11/22/2012, 8:04 AM

## 2012-11-22 NOTE — ED Notes (Signed)
Pt states last BM this evening

## 2012-11-23 ENCOUNTER — Inpatient Hospital Stay (HOSPITAL_COMMUNITY): Payer: Medicare Other

## 2012-11-23 NOTE — Progress Notes (Addendum)
[facility] PROGRESS NOTE  Laura Perkins ZOX:[id number] DOB: 1935-07-19 DOA: 11/22/2012 PCP: No primary provider on file.  Brief narrative: 78 y.o. female with history of uterine cancer, s/p hysterectomy and radiation therapy, HTN, hypercholesterolemia, hx of SBO 09/2012 who presented with one day history of abdominal discomfort with distention and nausea. She had similar presentation in 09/2012 and CT at that time showed enteritis and SBO with transitional point. She was treated conservatively under Dr Derl Barrow service, and had spontaneous resolution.  Evaluation in the ER included a abdominal CT with constrast which showed SBO with transitional point and inflammation at the distal small bowel suggestive of edema or enteritis. Her lab work up is unremarkable. Patient has had NG tube for decompression which seems to be improving the pain, distention and nausea. We will d/c NGT today 11/23/2012 and advance diet to CLD.  Assessment/Plan:   Principal Problem:  *SBO (small bowel obstruction)  Improving Discontinue NG tube and start clear liquid diet Appreciate surgery following   Code Status: full code  Family Communication: no family at bedside  Disposition Plan: home when stable likely in am  Manson Passey, MD  [facility]  Pager [phone]   Consultants:  Surgery Procedures:  NG tube decompression 11/22/2012; discontinued 11/23/2012 Antibiotics:  None   If 7PM-7AM, please contact night-coverage [email or web address] Password [email or web address] 11/23/2012, 12:06 PM   LOS: 1 day   HPI/Subjective: Feels better today.  Objective: Filed Vitals:   11/22/12 1430 11/22/12 2059 11/23/12 0535 11/23/12 1044  BP: 128/88 142/64 122/66 118/68  Pulse: 61 60 57 63  Temp: 98.7 F (37.1 C) 98.4 F (36.9 C) 98.4 F (36.9 C)   TempSrc: Oral Oral Oral   Resp: 18 20 18 20   Height:      Weight:      SpO2: 99% 98% 96%     Intake/Output Summary (Last 24 hours) at 11/23/12 1206 Last data filed at 11/23/12 0535  Gross per 24 hour  Intake   1970 ml  Output   1150 ml  Net    820 ml    Exam:   General:  Pt is alert, follows commands appropriately, not in acute distress  Cardiovascular: Regular rate and rhythm, S1/S2, no murmurs, no rubs, no gallops  Respiratory: Clear to auscultation bilaterally, no wheezing, no crackles, no rhonchi  Abdomen: Soft, non tender, non distended, bowel sounds present, no guarding  Extremities: No edema, pulses DP and PT palpable bilaterally  Neuro: Grossly nonfocal  Data Reviewed: Basic Metabolic Panel:  Lab 11/22/12 8295  NA 137  K 4.1  CL 100  CO2 30  GLUCOSE 134*  BUN 13  CREATININE 0.85  CALCIUM 9.8  MG --  PHOS --   Liver Function Tests:  Lab 11/22/12 0350  AST 23  ALT 8  ALKPHOS 76  BILITOT 0.4  PROT 6.9  ALBUMIN 3.7    Lab 11/22/12 0350  LIPASE 55  AMYLASE --   No results found for this basename: AMMONIA:5 in the last 168 hours CBC:  Lab 11/22/12 0350  WBC 9.6  NEUTROABS --  HGB 13.1  HCT 40.4  MCV 99.5  PLT 321   Cardiac Enzymes: No results found for this basename: CKTOTAL:5,CKMB:5,CKMBINDEX:5,TROPONINI:5 in the last 168 hours BNP: No components found with this basename: POCBNP:5 CBG: No results found for this basename: GLUCAP:5 in the last 168 hours  No results found for this or any previous visit (from the past 240 hour(s)).   Studies: Ct Abdomen Pelvis W Contrast  11/22/2012  *RADIOLOGY REPORT*  Clinical Data: Abdominal pain.  History of uterine cancer and small bowel obstruction.  CT ABDOMEN AND PELVIS WITH CONTRAST  Technique:  Multidetector CT imaging of the abdomen and pelvis was performed following the standard protocol during bolus administration of intravenous contrast.  Contrast: OMNIPAQUE IOHEXOL 300 MG/ML  SOLN  Comparison: 09/30/2012  Findings: Lung bases are clear. Heart is borderline enlarged.  No effusions.  Stomach is distended with food material and contrast.  Tiny low density lesions scattered  throughout the liver, likely small cysts. These are unchanged.  Gallbladder, spleen, pancreas, adrenals and kidneys unremarkable.  As seen on the prior CT, there is a caliber change within the small bowel loops in the lower pelvis.  Distal small bowel is decompressed.  A segment of small bowel at the transition point demonstrates mild wall thickening suggesting enteritis.  Small amount of free fluid adjacent to the liver and in the pelvis.  No free air or adenopathy.  Prior hysterectomy.  No adnexal masses.  Urinary bladder is grossly unremarkable.  No acute bony abnormality.  Degenerative changes in the lumbar spine.   IMPRESSION: Transition in the small bowel loops in the lower pelvis suggesting small bowel obstruction.  Mild wall thickening within the distal small bowel loops at the level of transition suggesting enteritis or edema.   Original Report Authenticated By: Charlett Nose, M.D.    Dg Abd 2 Views  11/23/2012  *RADIOLOGY REPORT*  Clinical Data: Partial small bowel obstruction.  Abdominal pain and distension.  ABDOMEN - 2 VIEW  Comparison: 10/02/2012.  Findings: Gas, stool and oral contrast material is noted throughout the colon extending to the level of the rectum.  No pathologic distension of small bowel is noted.  No pneumoperitoneum. Nasogastric tube is seen extending into the stomach.  Surgical clips project over the lateral aspect of the left breast, and a Port-A-Cath is incompletely visualized.  Numerous pelvic phleboliths are incidentally noted.  IMPRESSION: 1.  Nonobstructive bowel gas pattern. 2.  No pneumoperitoneum. 3.  Postoperative changes of support apparatus, as above.   Original Report Authenticated By: Trudie Reed, M.D.     Scheduled Meds:   . aspirin  81 mg Oral q morning - 10a  . atorvastatin  10 mg Oral QODAY  . atorvastatin  20 mg Oral QODAY  . chlorhexidine  15 mL Mouth/Throat BID  . docusate sodium  100 mg Oral BID  . gabapentin  100 mg Oral TID  . heparin  5,000  Units Subcutaneous Q8H  . mirabegron ER  25 mg Oral Daily  . polyethylene glycol  17 g Oral Daily  . propranolol ER  120 mg Oral q morning - 10a   Continuous Infusions:   . dextrose 5% lactated ringers 125 mL/hr at 11/23/12 1119

## 2012-11-23 NOTE — Progress Notes (Signed)
Patient ID: Laura Perkins, female   DOB: 06/29/1935, 78 y.o.   MRN: [id number] [facility] Surgery Progress Note:   * No surgery found *  Subjective: Mental status is clear.  No complaints except NG tube Objective: Vital signs in last 24 hours: Temp:  [98.4 F (36.9 C)-98.7 F (37.1 C)] 98.4 F (36.9 C) (01/12 0535) Pulse Rate:  [57-61] 57  (01/12 0535) Resp:  [18-20] 18  (01/12 0535) BP: (122-142)/(64-88) 122/66 mmHg (01/12 0535) SpO2:  [96 %-99 %] 96 % (01/12 0535) Weight:  [143 lb 1.6 oz (64.91 kg)] 143 lb 1.6 oz (64.91 kg) (01/11 1000)  Intake/Output from previous day: 01/11 0701 - 01/12 0700 In: 2170 [P.O.:40; I.V.:1800; NG/GT:330] Out: 1750 [Urine:1750] Intake/Output this shift:    Physical Exam: Work of breathing is normal.  + flatus.  Abdomen is nontender  Lab Results:  Results for orders placed during the hospital encounter of 11/22/12 (from the past 48 hour(s))  CBC     Status: Normal   Collection Time   11/22/12  3:50 AM      Component Value Range Comment   WBC 9.6  4.0 - 10.5 K/uL    RBC 4.06  3.87 - 5.11 MIL/uL    Hemoglobin 13.1  12.0 - 15.0 g/dL    HCT 40.9  81.1 - 91.4 %    MCV 99.5  78.0 - 100.0 fL    MCH 32.3  26.0 - 34.0 pg    MCHC 32.4  30.0 - 36.0 g/dL    RDW 78.2  95.6 - 21.3 %    Platelets 321  150 - 400 K/uL   COMPREHENSIVE METABOLIC PANEL     Status: Abnormal   Collection Time   11/22/12  3:50 AM      Component Value Range Comment   Sodium 137  135 - 145 mEq/L    Potassium 4.1  3.5 - 5.1 mEq/L    Chloride 100  96 - 112 mEq/L    CO2 30  19 - 32 mEq/L    Glucose, Bld 134 (*) 70 - 99 mg/dL    BUN 13  6 - 23 mg/dL    Creatinine, Ser 0.86  0.50 - 1.10 mg/dL    Calcium 9.8  8.4 - 57.8 mg/dL    Total Protein 6.9  6.0 - 8.3 g/dL    Albumin 3.7  3.5 - 5.2 g/dL    AST 23  0 - 37 U/L    ALT 8  0 - 35 U/L    Alkaline Phosphatase 76  39 - 117 U/L    Total Bilirubin 0.4  0.3 - 1.2 mg/dL    GFR calc non Af Amer 65 (*) >90 mL/min    GFR calc Af  Amer 76 (*) >90 mL/min   LIPASE, BLOOD     Status: Normal   Collection Time   11/22/12  3:50 AM      Component Value Range Comment   Lipase 55  11 - 59 U/L   URINALYSIS, ROUTINE W REFLEX MICROSCOPIC     Status: Abnormal   Collection Time   11/22/12  5:27 AM      Component Value Range Comment   Color, Urine YELLOW  YELLOW    APPearance CLEAR  CLEAR    Specific Gravity, Urine >1.046 (*) 1.005 - 1.030    pH 8.5 (*) 5.0 - 8.0    Glucose, UA NEGATIVE  NEGATIVE mg/dL    Hgb urine dipstick NEGATIVE  NEGATIVE    Bilirubin Urine NEGATIVE  NEGATIVE    Ketones, ur NEGATIVE  NEGATIVE mg/dL    Protein, ur NEGATIVE  NEGATIVE mg/dL    Urobilinogen, UA 0.2  0.0 - 1.0 mg/dL    Nitrite NEGATIVE  NEGATIVE    Leukocytes, UA NEGATIVE  NEGATIVE MICROSCOPIC NOT DONE ON URINES WITH NEGATIVE PROTEIN, BLOOD, LEUKOCYTES, NITRITE, OR GLUCOSE <1000 mg/dL.  TSH     Status: Normal   Collection Time   11/22/12  8:35 AM      Component Value Range Comment   TSH 2.328  0.350 - 4.500 uIU/mL     Radiology/Results: Ct Abdomen Pelvis W Contrast  11/22/2012  *RADIOLOGY REPORT*  Clinical Data: Abdominal pain.  History of uterine cancer and small bowel obstruction.  CT ABDOMEN AND PELVIS WITH CONTRAST  Technique:  Multidetector CT imaging of the abdomen and pelvis was performed following the standard protocol during bolus administration of intravenous contrast.  Contrast: OMNIPAQUE IOHEXOL 300 MG/ML  SOLN  Comparison: 09/30/2012  Findings: Lung bases are clear. Heart is borderline enlarged.  No effusions.  Stomach is distended with food material and contrast.  Tiny low density lesions scattered throughout the liver, likely small cysts. These are unchanged.  Gallbladder, spleen, pancreas, adrenals and kidneys unremarkable.  As seen on the prior CT, there is a caliber change within the small bowel loops in the lower pelvis.  Distal small bowel is decompressed.  A segment of small bowel at the transition point demonstrates  mild wall thickening suggesting enteritis.  Small amount of free fluid adjacent to the liver and in the pelvis.  No free air or adenopathy.  Prior hysterectomy.  No adnexal masses.  Urinary bladder is grossly unremarkable.  No acute bony abnormality.  Degenerative changes in the lumbar spine.   IMPRESSION: Transition in the small bowel loops in the lower pelvis suggesting small bowel obstruction.  Mild wall thickening within the distal small bowel loops at the level of transition suggesting enteritis or edema.   Original Report Authenticated By: Charlett Nose, M.D.    Dg Abd 2 Views  11/23/2012  *RADIOLOGY REPORT*  Clinical Data: Partial small bowel obstruction.  Abdominal pain and distension.  ABDOMEN - 2 VIEW  Comparison: 10/02/2012.  Findings: Gas, stool and oral contrast material is noted throughout the colon extending to the level of the rectum.  No pathologic distension of small bowel is noted.  No pneumoperitoneum. Nasogastric tube is seen extending into the stomach.  Surgical clips project over the lateral aspect of the left breast, and a Port-A-Cath is incompletely visualized.  Numerous pelvic phleboliths are incidentally noted.  IMPRESSION: 1.  Nonobstructive bowel gas pattern. 2.  No pneumoperitoneum. 3.  Postoperative changes of support apparatus, as above.   Original Report Authenticated By: Trudie Reed, M.D.     Anti-infectives: Anti-infectives    None      Assessment/Plan: Problem List: Patient Active Problem List  Diagnosis  . SBO (small bowel obstruction)  . History of uterine cancer  . Hx SBO  . Hypercholesterolemia    Xray shows contrast to rectum.  Will discontinue NG and start clears Improving ileus * No surgery found *    LOS: 1 day   Matt B. Daphine Deutscher, MD, [facility] [phone] beeper [phone]  11/23/2012 9:57 AM

## 2012-11-24 MED ORDER — SIMETHICONE 80 MG PO CHEW: 80.0000 mg | CHEWABLE_TABLET | Freq: Four times a day (QID) | ORAL | Status: DC | PRN

## 2012-11-24 MED ORDER — POLYETHYLENE GLYCOL 3350 17 G PO PACK: 17.0000 g | PACK | Freq: Every day | ORAL | Status: DC

## 2012-11-24 NOTE — Progress Notes (Signed)
Smiling sitting on bench.   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  Improving.  Recurrent episode concerning that SB resection may be needed at some point, but hold off & see if improved enough to switch to outpt w/u w f/u CT enterrhography/capsule endoscopy to see if transient inflammation vs chronic fibrosis.

## 2012-11-24 NOTE — Progress Notes (Signed)
PT Cancellation Note  Patient Details Name: Laura Perkins MRN: [id number] DOB: [birth year]   Cancelled Treatment:   Pt/family report that pt is up and moving fine in the room and that she does not need PT eveal    Donnetta Hail 11/24/2012, 10:48 AM

## 2012-11-24 NOTE — Plan of Care (Signed)
Problem: Food- and Nutrition-Related Knowledge Deficit (NB-1.1) Goal: Nutrition education Formal process to instruct or train a patient/client in a skill or to impart knowledge to help patients/clients voluntarily manage or modify food choices and eating behavior to maintain or improve health.  Outcome: Completed/Met Date Met:  11/24/12 Met with pt to discuss diet for small bowel obstruction. Discussed low fiber diet for when pt's bowel is inflamed. Discussed sources of fiber in the diet and low fiber choices. Provided handout of this information with RD contact information. Teach back method used. Pt expressed understanding. Expect good compliance.

## 2012-11-24 NOTE — Discharge Summary (Signed)
Physician Discharge Summary  Laura Perkins JYN:[id number] DOB: 03/07/1935 DOA: 11/22/2012  PCP: No primary provider on file.  Admit date: 11/22/2012 Discharge date: 11/24/2012  Discharge Diagnoses:  Principal Problem:  *SBO (small bowel obstruction) Active Problems:  History of uterine cancer  Hx SBO  Hypercholesterolemia  Discharge Condition: Medically stable for discharge home today  Diet recommendation: As tolerated  History of present illness:  78 y.o. female with history of uterine cancer, s/p hysterectomy and radiation therapy, HTN, hypercholesterolemia, hx of SBO 09/2012 who presented with one day history of abdominal discomfort with distention and nausea. She had similar presentation in 09/2012 and CT at that time showed enteritis and SBO with transitional point. She was treated conservatively under Dr Derl Barrow service, and had spontaneous resolution.  Evaluation in the ER included a abdominal CT with constrast which showed SBO with transitional point and inflammation at the distal small bowel suggestive of edema or enteritis. Her lab work up is unremarkable. Patient has had NG tube for decompression which seems to be improving the pain, distention and nausea. NG tube discontinued at 11/23/2012 and diet advanced to regular.  Assessment/Plan:   Principal Problem:  *SBO (small bowel obstruction)  Improving and based on x-ray ileus resolved Discontinued NG tube and started clear liquid diet which patient tolerated really well. This morning we have advanced diet to regular and no symptoms of nausea or vomiting or abdominal distention. Appreciate surgery following palpation in hospital   Code Status: full code  Family Communication: no family at bedside   Manson Passey, MD  [facility]  Pager [phone]   Consultants:  Surgery Procedures:  NG tube decompression 11/22/2012; discontinued 11/23/2012 Antibiotics:  None   Discharge Exam: Filed Vitals:   11/24/12 0516  BP: 103/57    Pulse: 57  Temp: 98.1 F (36.7 C)  Resp: 16   Filed Vitals:   11/23/12 1044 11/23/12 1456 11/23/12 2114 11/24/12 0516  BP: 118/68 111/72 125/62 103/57  Pulse: 63 63 54 57  Temp:  98.4 F (36.9 C) 98 F (36.7 C) 98.1 F (36.7 C)  TempSrc:  Oral Oral Oral  Resp: 20 16 16 16   Height:      Weight:      SpO2:  91% 99% 95%    General: Pt is alert, follows commands appropriately, not in acute distress Cardiovascular: Regular rate and rhythm, S1/S2 +, no murmurs, no rubs, no gallops Respiratory: Clear to auscultation bilaterally, no wheezing, no crackles, no rhonchi Abdominal: Soft, non tender, non distended, bowel sounds +, no guarding Extremities: no edema, no cyanosis, pulses palpable bilaterally DP and PT Neuro: Grossly nonfocal  Discharge Instructions  Discharge Orders    Future Orders Please Complete By Expires   Diet - low sodium heart healthy      Increase activity slowly      Call MD for:  persistant nausea and vomiting      Call MD for:  severe uncontrolled pain      Call MD for:  difficulty breathing, headache or visual disturbances      Call MD for:  persistant dizziness or light-headedness          Medication List     As of 11/24/2012  2:02 PM    STOP taking these medications         clindamycin 150 MG capsule   Commonly known as: CLEOCIN      TAKE these medications         aspirin 81 MG chewable tablet  Chew 81 mg by mouth every morning.      atorvastatin 20 MG tablet   Commonly known as: LIPITOR   Take 10-20 mg by mouth daily. Alternate 10 mg (1/2 tablet) with 20 mg (1 tablet ) every other day      calcium-vitamin D 500-200 MG-UNIT per tablet   Commonly known as: OSCAL WITH D   Take 2 tablets by mouth every morning.      chlorhexidine 0.12 % solution   Commonly known as: PERIDEX   Use as directed 15 mLs in the mouth or throat 2 (two) times daily.      gabapentin 100 MG capsule   Commonly known as: NEURONTIN   Take 100 mg by mouth 3 (three)  times daily.      HYDROcodone-acetaminophen 5-325 MG per tablet   Commonly known as: NORCO/VICODIN   Take 1 tablet by mouth every 4 (four) hours as needed. For pain      mirabegron ER 25 MG Tb24   Commonly known as: MYRBETRIQ   Take 25 mg by mouth daily.      multivitamin with minerals Tabs   Take 1 tablet by mouth daily.      polyethylene glycol packet   Commonly known as: MIRALAX / GLYCOLAX   Take 17 g by mouth daily.      propranolol ER 120 MG 24 hr capsule   Commonly known as: INDERAL LA   Take 120 mg by mouth every morning.      simethicone 80 MG chewable tablet   Commonly known as: MYLICON   Chew 1 tablet (80 mg total) by mouth every 6 (six) hours as needed for flatulence.          The results of significant diagnostics from this hospitalization (including imaging, microbiology, ancillary and laboratory) are listed below for reference.    Significant Diagnostic Studies: Ct Abdomen Pelvis W Contrast  11/22/2012  *RADIOLOGY REPORT*  Clinical Data: Abdominal pain.  History of uterine cancer and small bowel obstruction.  CT ABDOMEN AND PELVIS WITH CONTRAST  Technique:  Multidetector CT imaging of the abdomen and pelvis was performed following the standard protocol during bolus administration of intravenous contrast.  Contrast: OMNIPAQUE IOHEXOL 300 MG/ML  SOLN  Comparison: 09/30/2012  Findings: Lung bases are clear. Heart is borderline enlarged.  No effusions.  Stomach is distended with food material and contrast.  Tiny low density lesions scattered throughout the liver, likely small cysts. These are unchanged.  Gallbladder, spleen, pancreas, adrenals and kidneys unremarkable.  As seen on the prior CT, there is a caliber change within the small bowel loops in the lower pelvis.  Distal small bowel is decompressed.  A segment of small bowel at the transition point demonstrates mild wall thickening suggesting enteritis.  Small amount of free fluid adjacent to the liver and in  the pelvis.  No free air or adenopathy.  Prior hysterectomy.  No adnexal masses.  Urinary bladder is grossly unremarkable.  No acute bony abnormality.  Degenerative changes in the lumbar spine.   IMPRESSION: Transition in the small bowel loops in the lower pelvis suggesting small bowel obstruction.  Mild wall thickening within the distal small bowel loops at the level of transition suggesting enteritis or edema.   Original Report Authenticated By: Charlett Nose, M.D.    Dg Abd 2 Views  11/23/2012  *RADIOLOGY REPORT*  Clinical Data: Partial small bowel obstruction.  Abdominal pain and distension.  ABDOMEN - 2 VIEW  Comparison: 10/02/2012.  Findings: Gas, stool and oral contrast material is noted throughout the colon extending to the level of the rectum.  No pathologic distension of small bowel is noted.  No pneumoperitoneum. Nasogastric tube is seen extending into the stomach.  Surgical clips project over the lateral aspect of the left breast, and a Port-A-Cath is incompletely visualized.  Numerous pelvic phleboliths are incidentally noted.  IMPRESSION: 1.  Nonobstructive bowel gas pattern. 2.  No pneumoperitoneum. 3.  Postoperative changes of support apparatus, as above.   Original Report Authenticated By: Trudie Reed, M.D.     Microbiology: No results found for this or any previous visit (from the past 240 hour(s)).   Labs: Basic Metabolic Panel:  Lab 11/22/12 4540  NA 137  K 4.1  CL 100  CO2 30  GLUCOSE 134*  BUN 13  CREATININE 0.85  CALCIUM 9.8  MG --  PHOS --   Liver Function Tests:  Lab 11/22/12 0350  AST 23  ALT 8  ALKPHOS 76  BILITOT 0.4  PROT 6.9  ALBUMIN 3.7    Lab 11/22/12 0350  LIPASE 55  AMYLASE --   No results found for this basename: AMMONIA:5 in the last 168 hours CBC:  Lab 11/22/12 0350  WBC 9.6  NEUTROABS --  HGB 13.1  HCT 40.4  MCV 99.5  PLT 321   Cardiac Enzymes: No results found for this basename: CKTOTAL:5,CKMB:5,CKMBINDEX:5,TROPONINI:5 in the  last 168 hours BNP: BNP (last 3 results) No results found for this basename: PROBNP:3 in the last 8760 hours CBG: No results found for this basename: GLUCAP:5 in the last 168 hours  Time coordinating discharge: Over 30 minutes  Signed:  Manson Passey, MD  [facility] 11/24/2012, 2:02 PM  Pager #: [phone]

## 2012-11-24 NOTE — Progress Notes (Signed)
  Subjective: Feels better, Dr Elisabeth Pigeon has already increased her diet.  We will see how she does with regular diet.  She was NPO prior to this.  Objective: Vital signs in last 24 hours: Temp:  [98 F (36.7 C)-98.4 F (36.9 C)] 98.1 F (36.7 C) (01/13 0516) Pulse Rate:  [54-63] 57  (01/13 0516) Resp:  [16] 16  (01/13 0516) BP: (103-125)/(57-72) 103/57 mmHg (01/13 0516) SpO2:  [91 %-99 %] 95 % (01/13 0516) Last BM Date: 11/24/12  Nothing recorded for NG output, she has had BM yesterday and today. She has had a regular diet started today  VSS, No labs Intake/Output from previous day: Dec 22, 2022 0701 - 01/13 0700 In: 2780.4 [P.O.:1320; I.V.:1460.4] Out: 1400 [Urine:1400] Intake/Output this shift: Total I/O In: 240 [P.O.:240] Out: -   General appearance: alert, cooperative and no distress GI: soft, non-tender; bowel sounds normal; no masses,  no organomegaly  Lab Results:   [facility] 11/22/12 0350  WBC 9.6  HGB 13.1  HCT 40.4  PLT 321    BMET  Basename 11/22/12 0350  NA 137  K 4.1  CL 100  CO2 30  GLUCOSE 134*  BUN 13  CREATININE 0.85  CALCIUM 9.8   PT/INR No results found for this basename: LABPROT:2,INR:2 in the last 72 hours   Lab 11/22/12 0350  AST 23  ALT 8  ALKPHOS 76  BILITOT 0.4  PROT 6.9  ALBUMIN 3.7     Lipase     Component Value Date/Time   LIPASE 55 11/22/2012 0350     Studies/Results: Dg Abd 2 Views  Dec 22, 2012  *RADIOLOGY REPORT*  Clinical Data: Partial small bowel obstruction.  Abdominal pain and distension.  ABDOMEN - 2 VIEW  Comparison: 10/02/2012.  Findings: Gas, stool and oral contrast material is noted throughout the colon extending to the level of the rectum.  No pathologic distension of small bowel is noted.  No pneumoperitoneum. Nasogastric tube is seen extending into the stomach.  Surgical clips project over the lateral aspect of the left breast, and a Port-A-Cath is incompletely visualized.  Numerous pelvic phleboliths are  incidentally noted.  IMPRESSION: 1.  Nonobstructive bowel gas pattern. 2.  No pneumoperitoneum. 3.  Postoperative changes of support apparatus, as above.   Original Report Authenticated By: Trudie Reed, M.D.     Medications:    . aspirin  81 mg Oral q morning - 10a  . atorvastatin  10 mg Oral QODAY  . atorvastatin  20 mg Oral QODAY  . chlorhexidine  15 mL Mouth/Throat BID  . docusate sodium  100 mg Oral BID  . gabapentin  100 mg Oral TID  . heparin  5,000 Units Subcutaneous Q8H  . mirabegron ER  25 mg Oral Daily  . polyethylene glycol  17 g Oral Daily  . propranolol ER  120 mg Oral q morning - 10a    Assessment/Plan Recurrent SBO, with hx of Uterine Cancer, hysterectomy, radiation and chemotherapy. Hypertension dyslipidemia   Plan:  Medicine has advanced her diet, and we will follow.  Since this is her second episode since 09/2012, it may be helpful to get a CT enterography in a couple weeks and look for some area of narrowing  Causing recurrent SBO.    LOS: 2 days    Laura Perkins 11/24/2012

## 2012-11-24 NOTE — Care Management (Signed)
CM spoke with pt with spouse at bedside. No HH services needed. No other needs stated.   Roxy Manns Kejon Feild,RN,BSN [phone]

## 2012-12-27 ENCOUNTER — Other Ambulatory Visit: Payer: Self-pay

## 2013-03-09 ENCOUNTER — Encounter: Payer: Self-pay | Admitting: Neurology

## 2013-03-09 ENCOUNTER — Ambulatory Visit (INDEPENDENT_AMBULATORY_CARE_PROVIDER_SITE_OTHER): Payer: Medicare Other | Admitting: Neurology

## 2013-03-09 VITALS — BP 132/69 | HR 58 | Ht 60.0 in | Wt 124.5 lb

## 2013-03-09 DIAGNOSIS — R413 Other amnesia: Secondary | ICD-10-CM

## 2013-03-09 MED ORDER — MEMANTINE HCL ER 7 & 14 & 21 &28 MG PO CP24: 1.0000 | ORAL_CAPSULE | Freq: Every day | ORAL | Status: DC

## 2013-03-09 MED ORDER — MEMANTINE HCL ER 28 MG PO CP24: 28.0000 mg | ORAL_CAPSULE | Freq: Every day | ORAL | Status: DC

## 2013-03-09 NOTE — Progress Notes (Signed)
HPI:  78 year old right-handed white married female accompanied by her husband following up for memory loss  She has past history of left breast cancer in 1990s, requiring partial mastectomy and lumpectomy.   She developed clinical stage II uterine carcinosarcoma diagnosed at the time of vaginal hysterectomy and BSO for pelvic prolapse 2010 and underwent adjuvant treatments of therapy withTaxol and carboplatin chemotherapy beginning  October 2010, external beam radiation, and then repeat Taxol and carboplatin chemotherapy, finishing 02/27/2010.  She has noted memory problems since October 2010,  unable to complete a sentence. She has difficulty with short-term memory. She "cannot remember what she did yesterday" She feels "foggy". She enjoys reading and but has trouble remembering what she was reading,   Her brother has memory loss and Parkinson's disease,  She denies visual loss, double vision, swallowing problems, slurred speech, blackout spells,or seizures.  MRI of the brain without contrast 03/31/2010 showed generalized atrophy and small vessel disease.  B12 level, RPR,TSH, and Methylmalonic acid were normal.  doppler of the carotids 03/28/2010 was normal.    She thinks of something she wants to do and cannot remember it  in a few minutes. She cannot remember facts. She cannot remember conversations and asks the  same questions over and over. She exercises at [employer] 3 times per week.  She has tingling in her hands and fingers that also involves her feet and legs and  is worse in her feet.  She has mild difficulty walking by herself. This started in October 2010 and has not been painful. She describes it as a "pins and needles feeling".She does have urinary incontinence. She feels like her feet are "going to explode". She describes a tightness like bands around them.    The discomfort in her legs can awaken her at night and she feels that she must take her feet from underneath the bed  clothes. Her pain in her feet is 4/10,  She has similar symptoms in her hands. She has muscle cramps in her legs at night.   She still has leg muscle cramping, painful feet, numbness tingling at her feet, she denies low back pain, no gait difficulty,   She enjoys reading, newspapers, books, can not remember what she has read, she likes to clean, cook, exercise at [employer], silver sneaker, she walkers 2 miles everyday  She was given aricept in the past,  She began to have nightmares, She retired as Tour manager to the institution,  She complains of crazy dream, night mares, sleep disturbance.  Review of Systems  Out of a complete 14 system review, the patient complains of only the following symptoms, and all other reviewed systems are negative.   Constitutional:   N/A Cardiovascular:  N/A Ear/Nose/Throat:  N/A Skin: N/A Eyes: N/A Respiratory: N/A Gastroitestinal: N/A    Hematology/Lymphatic:  N/A Endocrine:  N/A Musculoskeletal: cramps Allergy/Immunology: skin sensitivity Neurological: memory loss, numbness Psychiatric:    N/A      Physical Exam  General:  Well-developed white female.    Neurologic Exam  Mental Status:  alert and oriented x3. No aphasia, agnosia, or apraxia. MMSE 29/30. He missed 1/3 recall Cranial Nerves:  Visual fields full.Discs flat. Extraocular movements full.Bilateral ptosis, right greater than left. Corneals present.Facial sensation equal. Hearing decreased.Tongue midline, uvula midline, Gags present. Sternocleidomastoid and trapezius testing normal Motor: 5/5 except for weakness of the intrinsics muscles of the toes.Bilateral hammertoes. Sensory:  Decreased joint position and vibration. Pinprick and touch relatively intact.  Coordination:  Normal finger  to nose, heel to shin. Outstretched hand and arm tremor Gait and Station:  Can get out of a chair without using her hands. Gait unsteady. Negative Romberg. Mild to moderate difficulty with tiptoe heel walk,  and  tandem gait.   Reflexes:  2+ with ankle reflexes. Plantar responses downgoing  Assessment and plan: 78 year old female, with gradual onset memory trouble, most consistent with cognitive impairment, Mini-Mental Status 29 out of 30, differentiation diagnosis including age related, chemotherapy radiation induced, vs. early central nervous system degenerative disorder.  1. Will add on Namenda, titrating to 28 mg every day. 2. moderate exercise 3. return to clinic in 6 months

## 2013-04-25 ENCOUNTER — Other Ambulatory Visit: Payer: Self-pay | Admitting: Neurology

## 2013-06-08 ENCOUNTER — Telehealth: Payer: Self-pay | Admitting: Neurology

## 2013-06-09 NOTE — Telephone Encounter (Signed)
I called patient yesterday, .got no answer.  Called again today.  Spoke with patient.  I explained there is not currently a generic available for Namenda.  I offered her a discount card for one free month and as well will give her some samples.  Patient was very grateful and will pick up meds and voucher later this afternoon.  I dispensed Namenda XR 28mg  # 30 Lot [id number] Exp 02/2014

## 2013-06-17 ENCOUNTER — Other Ambulatory Visit: Payer: Self-pay

## 2013-06-17 MED ORDER — GABAPENTIN 100 MG PO CAPS: 100.0000 mg | ORAL_CAPSULE | Freq: Three times a day (TID) | ORAL | Status: DC

## 2013-06-25 ENCOUNTER — Inpatient Hospital Stay (HOSPITAL_COMMUNITY)
Admission: EM | Admit: 2013-06-25 | Discharge: 2013-06-27 | DRG: 390 | Disposition: A | Discharge status: Home / Self Care | Payer: Medicare Other | Attending: Internal Medicine | Admitting: Internal Medicine

## 2013-06-25 ENCOUNTER — Emergency Department (HOSPITAL_COMMUNITY): Payer: Medicare Other

## 2013-06-25 ENCOUNTER — Encounter (HOSPITAL_COMMUNITY): Payer: Self-pay | Admitting: Emergency Medicine

## 2013-06-25 DIAGNOSIS — F0280 Dementia in other diseases classified elsewhere without behavioral disturbance: Secondary | ICD-10-CM

## 2013-06-25 DIAGNOSIS — F09 Unspecified mental disorder due to known physiological condition: Secondary | ICD-10-CM | POA: Diagnosis present

## 2013-06-25 DIAGNOSIS — G309 Alzheimer's disease, unspecified: Secondary | ICD-10-CM | POA: Diagnosis present

## 2013-06-25 DIAGNOSIS — K56609 Unspecified intestinal obstruction, unspecified as to partial versus complete obstruction: Secondary | ICD-10-CM

## 2013-06-25 DIAGNOSIS — Z853 Personal history of malignant neoplasm of breast: Secondary | ICD-10-CM

## 2013-06-25 DIAGNOSIS — K565 Intestinal adhesions [bands], unspecified as to partial versus complete obstruction: Principal | ICD-10-CM | POA: Diagnosis present

## 2013-06-25 DIAGNOSIS — E785 Hyperlipidemia, unspecified: Secondary | ICD-10-CM

## 2013-06-25 DIAGNOSIS — N318 Other neuromuscular dysfunction of bladder: Secondary | ICD-10-CM | POA: Diagnosis present

## 2013-06-25 DIAGNOSIS — Z79899 Other long term (current) drug therapy: Secondary | ICD-10-CM

## 2013-06-25 DIAGNOSIS — E7800 Pure hypercholesterolemia, unspecified: Secondary | ICD-10-CM | POA: Diagnosis present

## 2013-06-25 DIAGNOSIS — I10 Essential (primary) hypertension: Secondary | ICD-10-CM

## 2013-06-25 HISTORY — DX: Ileus, unspecified: K56.7

## 2013-06-25 HISTORY — DX: Peritoneal adhesions (postprocedural) (postinfection): K66.0

## 2013-06-25 HISTORY — DX: Dementia in other diseases classified elsewhere, unspecified severity, without behavioral disturbance, psychotic disturbance, mood disturbance, and anxiety: F02.80

## 2013-06-25 LAB — COMPREHENSIVE METABOLIC PANEL
BUN: 18 mg/dL (ref 6–23)
CO2: 26 mEq/L (ref 19–32)
Chloride: 106 mEq/L (ref 96–112)
Creatinine, Ser: 0.83 mg/dL (ref 0.50–1.10)
GFR calc Af Amer: 77 mL/min — ABNORMAL LOW (ref >90)
GFR calc non Af Amer: 67 mL/min — ABNORMAL LOW (ref >90)
Glucose, Bld: 148 mg/dL — ABNORMAL HIGH (ref 70–99)
Total Bilirubin: 0.7 mg/dL (ref 0.3–1.2)

## 2013-06-25 LAB — CBC WITH DIFFERENTIAL/PLATELET
HCT: 40.2 % (ref 36.0–46.0)
Hemoglobin: 13.4 g/dL (ref 12.0–15.0)
Lymphocytes Relative: 13 % (ref 12–46)
Monocytes Absolute: 0.5 10*3/uL (ref 0.1–1.0)
Monocytes Relative: 6 % (ref 3–12)
Neutro Abs: 7.4 10*3/uL (ref 1.7–7.7)
WBC: 9.1 10*3/uL (ref 4.0–10.5)

## 2013-06-25 LAB — URINE MICROSCOPIC-ADD ON

## 2013-06-25 LAB — URINALYSIS, ROUTINE W REFLEX MICROSCOPIC
Glucose, UA: NEGATIVE mg/dL
Ketones, ur: 15 mg/dL — AB
Protein, ur: NEGATIVE mg/dL

## 2013-06-25 LAB — POCT I-STAT, CHEM 8
BUN: 21 mg/dL (ref 6–23)
Calcium, Ion: 1.16 mmol/L (ref 1.13–1.30)
Chloride: 108 mEq/L (ref 96–112)

## 2013-06-25 LAB — LIPASE, BLOOD: Lipase: 53 U/L (ref 11–59)

## 2013-06-25 MED ORDER — IOHEXOL 300 MG/ML  SOLN
50.0000 mL | Freq: Once | INTRAMUSCULAR | Status: AC | PRN
  Administered 2013-06-25: 50 mL via ORAL

## 2013-06-25 MED ORDER — ACETAMINOPHEN 650 MG RE SUPP: 650.0000 mg | Freq: Four times a day (QID) | RECTAL | Status: DC | PRN

## 2013-06-25 MED ORDER — HYDRALAZINE HCL 20 MG/ML IJ SOLN
5.0000 mg | Freq: Four times a day (QID) | INTRAMUSCULAR | Status: DC | PRN
  Filled 2013-06-25: qty 1

## 2013-06-25 MED ORDER — HEPARIN SODIUM (PORCINE) 5000 UNIT/ML IJ SOLN
5000.0000 [IU] | Freq: Three times a day (TID) | INTRAMUSCULAR | Status: DC
  Administered 2013-06-25 – 2013-06-27 (×5): 5000 [IU] via SUBCUTANEOUS
  Filled 2013-06-25 (×8): qty 1

## 2013-06-25 MED ORDER — ONDANSETRON HCL 4 MG/2ML IJ SOLN
4.0000 mg | Freq: Four times a day (QID) | INTRAMUSCULAR | Status: DC
  Filled 2013-06-25 (×4): qty 2

## 2013-06-25 MED ORDER — FENTANYL CITRATE 0.05 MG/ML IJ SOLN
50.0000 ug | INTRAMUSCULAR | Status: DC | PRN
  Administered 2013-06-25: 50 ug via INTRAVENOUS
  Filled 2013-06-25: qty 2

## 2013-06-25 MED ORDER — ACETAMINOPHEN 325 MG PO TABS: 650.0000 mg | ORAL_TABLET | Freq: Four times a day (QID) | ORAL | Status: DC | PRN

## 2013-06-25 MED ORDER — KCL IN DEXTROSE-NACL 20-5-0.9 MEQ/L-%-% IV SOLN
INTRAVENOUS | Status: DC
  Administered 2013-06-25: 22:00:00 via INTRAVENOUS
  Administered 2013-06-25: 100 mL/h via INTRAVENOUS
  Administered 2013-06-26 – 2013-06-27 (×2): via INTRAVENOUS
  Filled 2013-06-25 (×6): qty 1000

## 2013-06-25 MED ORDER — PANTOPRAZOLE SODIUM 40 MG IV SOLR
40.0000 mg | INTRAVENOUS | Status: DC
  Administered 2013-06-25 – 2013-06-26 (×2): 40 mg via INTRAVENOUS
  Filled 2013-06-25 (×3): qty 40

## 2013-06-25 MED ORDER — FENTANYL CITRATE 0.05 MG/ML IJ SOLN
50.0000 ug | Freq: Once | INTRAMUSCULAR | Status: AC
  Administered 2013-06-25: 50 ug via INTRAVENOUS
  Filled 2013-06-25: qty 2

## 2013-06-25 MED ORDER — ONDANSETRON HCL 4 MG/2ML IJ SOLN
4.0000 mg | Freq: Four times a day (QID) | INTRAMUSCULAR | Status: DC | PRN
  Filled 2013-06-25: qty 2

## 2013-06-25 MED ORDER — ONDANSETRON HCL 4 MG/2ML IJ SOLN
4.0000 mg | Freq: Once | INTRAMUSCULAR | Status: AC
  Administered 2013-06-25: 4 mg via INTRAVENOUS
  Filled 2013-06-25: qty 2

## 2013-06-25 MED ORDER — IOHEXOL 300 MG/ML  SOLN
100.0000 mL | Freq: Once | INTRAMUSCULAR | Status: AC | PRN
  Administered 2013-06-25: 100 mL via INTRAVENOUS

## 2013-06-25 MED ORDER — ONDANSETRON HCL 4 MG PO TABS: 4.0000 mg | ORAL_TABLET | Freq: Four times a day (QID) | ORAL | Status: DC | PRN

## 2013-06-25 MED ORDER — MORPHINE SULFATE 2 MG/ML IJ SOLN: 2.0000 mg | INTRAMUSCULAR | Status: DC | PRN

## 2013-06-25 NOTE — Consult Note (Signed)
Reason for Consult: partial small bowel obstruction Referring Physician:   HPI: Laura Perkins is a 78 year old female with a history of hypertension, OAB, uterine cancer, SBO, abdominal hysterectomy, hernia repair who presented to [facility] early this morning with abdominal pain and vomiting.  Duration of symptoms is 1 day.  Onset was sudden.  Coarse is worsened.  Moderate in severity.  Characterized as constant, sharp pain.  Associated with bloating and burping.  She denies fever, chills or sweats.  Denies melena or hematochezia.  She had a bowel movement yesterday.  Has not had any flatus since.  Denies melena or hematochezia.  Denies past history of crohn's, colitis or diverticulitis.  She had a normal colonoscopy 8-9 years ago.  She denies recent weight loss or malaise.  Previous episodes; hospitalization in January of 2014 which resolved with conservative treatment and November 2013 for which she was hospitalized under our service, she was treated with NGT decompression and resolved.    Past Medical History  Diagnosis Date  . Hypertension   . Overactive bladder   . High blood pressure   . Cancer     carcinoma sarcoma, left breast   . High cholesterol   . Ileus   . Abdominal adhesions     Past Surgical History  Procedure Laterality Date  . Abdominal hysterectomy    . Breast biopsy    . Ankle surgery    . Hernia repair      as a child    Family History  Problem Relation Age of Onset  . Cancer Father     pancreatic     Social History:  reports that she has never smoked. She has never used smokeless tobacco. She reports that she does not drink alcohol or use illicit drugs.  Married.  Husband at bedside.    Allergies:  Allergies  Allergen Reactions  . Sulfa Antibiotics Anaphylaxis    Medications: {medication reviewed  Results for orders placed during the hospital encounter of 06/25/13 (from the past 48 hour(s))  URINALYSIS, ROUTINE W REFLEX MICROSCOPIC     Status: Abnormal    Collection Time    06/25/13  3:41 AM      Result Value Range   Color, Urine YELLOW  YELLOW   APPearance CLEAR  CLEAR   Specific Gravity, Urine 1.032 (*) 1.005 - 1.030   pH 7.0  5.0 - 8.0   Glucose, UA NEGATIVE  NEGATIVE mg/dL   Hgb urine dipstick NEGATIVE  NEGATIVE   Bilirubin Urine NEGATIVE  NEGATIVE   Ketones, ur 15 (*) NEGATIVE mg/dL   Protein, ur NEGATIVE  NEGATIVE mg/dL   Urobilinogen, UA 1.0  0.0 - 1.0 mg/dL   Nitrite NEGATIVE  NEGATIVE   Leukocytes, UA TRACE (*) NEGATIVE  URINE MICROSCOPIC-ADD ON     Status: Abnormal   Collection Time    06/25/13  3:41 AM      Result Value Range   WBC, UA 0-2  <3 WBC/hpf   Bacteria, UA RARE  RARE   Casts HYALINE CASTS (*) NEGATIVE   Urine-Other AMORPHOUS URATES/PHOSPHATES    CBC WITH DIFFERENTIAL     Status: Abnormal   Collection Time    06/25/13  4:15 AM      Result Value Range   WBC 9.1  4.0 - 10.5 K/uL   RBC 4.09  3.87 - 5.11 MIL/uL   Hemoglobin 13.4  12.0 - 15.0 g/dL   HCT 40.9  81.1 - 91.4 %   MCV  98.3  78.0 - 100.0 fL   MCH 32.8  26.0 - 34.0 pg   MCHC 33.3  30.0 - 36.0 g/dL   RDW 57.8  46.9 - 62.9 %   Platelets 266  150 - 400 K/uL   Neutrophils Relative % 81 (*) 43 - 77 %   Neutro Abs 7.4  1.7 - 7.7 K/uL   Lymphocytes Relative 13  12 - 46 %   Lymphs Abs 1.2  0.7 - 4.0 K/uL   Monocytes Relative 6  3 - 12 %   Monocytes Absolute 0.5  0.1 - 1.0 K/uL   Eosinophils Relative 1  0 - 5 %   Eosinophils Absolute 0.1  0.0 - 0.7 K/uL   Basophils Relative 0  0 - 1 %   Basophils Absolute 0.0  0.0 - 0.1 K/uL  COMPREHENSIVE METABOLIC PANEL     Status: Abnormal   Collection Time    06/25/13  4:15 AM      Result Value Range   Sodium 140  135 - 145 mEq/L   Potassium 3.8  3.5 - 5.1 mEq/L   Chloride 106  96 - 112 mEq/L   CO2 26  19 - 32 mEq/L   Glucose, Bld 148 (*) 70 - 99 mg/dL   BUN 18  6 - 23 mg/dL   Creatinine, Ser 5.28  0.50 - 1.10 mg/dL   Calcium 9.7  8.4 - 41.3 mg/dL   Total Protein 6.7  6.0 - 8.3 g/dL   Albumin 3.9  3.5 -  5.2 g/dL   AST 21  0 - 37 U/L   ALT 19  0 - 35 U/L   Alkaline Phosphatase 75  39 - 117 U/L   Total Bilirubin 0.7  0.3 - 1.2 mg/dL   GFR calc non Af Amer 67 (*) >90 mL/min   GFR calc Af Amer 77 (*) >90 mL/min   Comment: (NOTE)     The eGFR has been calculated using the CKD EPI equation.     This calculation has not been validated in all clinical situations.     eGFR's persistently <90 mL/min signify possible Chronic Kidney     Disease.  LIPASE, BLOOD     Status: None   Collection Time    06/25/13  4:15 AM      Result Value Range   Lipase 53  11 - 59 U/L  POCT I-STAT, CHEM 8     Status: Abnormal   Collection Time    06/25/13  4:29 AM      Result Value Range   Sodium 143  135 - 145 mEq/L   Potassium 4.2  3.5 - 5.1 mEq/L   Chloride 108  96 - 112 mEq/L   BUN 21  6 - 23 mg/dL   Creatinine, Ser 2.44  0.50 - 1.10 mg/dL   Glucose, Bld 010 (*) 70 - 99 mg/dL   Calcium, Ion 2.72  5.36 - 1.30 mmol/L   TCO2 25  0 - 100 mmol/L   Hemoglobin 14.3  12.0 - 15.0 g/dL   HCT 64.4  03.4 - 74.2 %    Ct Abdomen Pelvis W Contrast  06/25/2013   *RADIOLOGY REPORT*  Clinical Data: Mid abdominal pain with nausea and vomiting. History of breast and uterine cancer.  CT ABDOMEN AND PELVIS WITH CONTRAST  Technique:  Multidetector CT imaging of the abdomen and pelvis was performed following the standard protocol during bolus administration of intravenous contrast.  Contrast: 50mL OMNIPAQUE IOHEXOL 300  MG/ML  SOLN, OMNIPAQUE IOHEXOL 300 MG/ML  SOLN  Comparison: 11/22/2012.  Findings: Lung bases show no acute findings.  The distal esophagus is dilated and fluid-filled.  Heart size normal.  No pericardial or pleural effusion.  Scattered sub centimeter low attenuation lesions in the liver are unchanged.  Liver, gallbladder, adrenal glands, kidneys, spleen, pancreas, stomach and proximal small bowel are unremarkable.  There is mild dilatation of distal small bowel loops with slight transition to normal caliber small  bowel in the anatomic pelvis (series 2, image 61). Decompressed distal small bowel in the anatomic pelvis appears minimally thickened.  Appendix and colon are unremarkable.  Small ascites with scattered interloop fluid. No free air.  Hysterectomy.  Scattered atherosclerotic calcification of the arterial vasculature without abdominal aortic aneurysm.  No pathologically enlarged lymph nodes.  No worrisome lytic or sclerotic lesions.  Degenerative changes are seen in the spine. Minimal grade 1 anterolisthesis of L4 on L5 and L5 on S1, unchanged.  IMPRESSION:  1.  Minimally dilated loops of distal small bowel with possible transition in the right anatomic pelvis.  Findings suggest a partial or developing early/mild small bowel obstruction. Decompressed pelvic small bowel loops appear mildly thickened. 2.  Small ascites.   Original Report Authenticated By: Leanna Battles, M.D.    Review of Systems  Constitutional: Negative for fever, chills and malaise/fatigue.  Eyes: Negative for blurred vision and double vision.  Respiratory: Negative for hemoptysis and shortness of breath.   Cardiovascular: Negative for chest pain, palpitations and orthopnea.  Gastrointestinal: Positive for nausea, vomiting and abdominal pain. Negative for blood in stool and melena.  Genitourinary: Negative for dysuria, hematuria and flank pain.  Neurological: Negative for dizziness, tingling, tremors, seizures, loss of consciousness, weakness and headaches.   Blood pressure 110/72, pulse 66, temperature 98.3 F (36.8 C), temperature source Oral, resp. rate 18, height 5' (1.524 m), weight 130 lb 1.1 oz (59 kg), SpO2 94.00%. Physical Exam  Constitutional: She is oriented to person, place, and time. She appears well-developed and well-nourished. No distress.  HENT:  Head: Normocephalic and atraumatic.  Eyes: Conjunctivae and EOM are normal. Pupils are equal, round, and reactive to light. Right eye exhibits no discharge. Left eye  exhibits no discharge. No scleral icterus.  Neck: Neck supple.  Cardiovascular: Normal rate, regular rhythm, normal heart sounds and intact distal pulses.  Exam reveals no gallop and no friction rub.   No murmur heard. Respiratory: Effort normal and breath sounds normal. No respiratory distress. She has no wheezes. She exhibits no tenderness.  GI: Bowel sounds are normal. She exhibits no mass. There is no guarding.  Epigastric tenderness, mildly distended, no evidence of peritonitis   Musculoskeletal: She exhibits no edema and no tenderness.  Lymphadenopathy:    She has no cervical adenopathy.  Neurological: She is oriented to person, place, and time.  Skin: Skin is warm and dry. No rash noted. She is not diaphoretic. No erythema. No pallor.  Psychiatric: She has a normal mood and affect. Her behavior is normal. Judgment and thought content normal.    Assessment/Plan: Partial small bowel obstruction: it is likely due to adhesions.  There are no indications for surgical intervention at this time.  We will treat her conservatively with NGT decompression, NPO, IVF, pain medication, anti-emetics.  Repeat films in the morning.  Surgery will continue to follow along.     Ashok Norris ANP-BC Pager [phone]  06/25/2013, 11:40 AM     She looks okay.  Abdomen nondistended.  Not sure what caused her symptoms.  Doesn't look like a bowel obstruction on exam.  However, given her history of multiple prior admissions for bowel obstruction, I think that this is a likely possibility.  I do not think that she needs surgery at this time, though this could be recurrent cancer.  I would recommend NG tube, IV fluids, bowel rest and follow clinically.  If this improves, then she will need outpatient workup by her GYN oncologist

## 2013-06-25 NOTE — ED Provider Notes (Signed)
CSN: [id number]     Arrival date & time 06/25/13  0321 History     First MD Initiated Contact with Patient 06/25/13 [phone]     Chief Complaint  Patient presents with  . Abdominal Pain   (Consider location/radiation/quality/duration/timing/severity/associated sxs/prior Treatment) HPI Hx per PT - N/V and ABD pain tonight with h/o ileus, previous hysto and is s/p radiation for ov cancer. No F/C, no bloody or bilious emesis, pain is dull, located across her ABD no radiation to her back.  Symptoms MOD to severe. No known alleviating factors  Past Medical History  Diagnosis Date  . Hypertension   . Overactive bladder   . High blood pressure   . Cancer     carcinoma sarcoma, left breast   . High cholesterol   . Ileus   . Abdominal adhesions    Past Surgical History  Procedure Laterality Date  . Abdominal hysterectomy    . Breast biopsy    . Ankle surgery     Family History  Problem Relation Age of Onset  . Cancer Father     pancreatic    History  Substance Use Topics  . Smoking status: Never Smoker   . Smokeless tobacco: Never Used  . Alcohol Use: No   OB History   Grav Para Term Preterm Abortions TAB SAB Ect Mult Living                 Review of Systems  Constitutional: Negative for fever and chills.  HENT: Negative for neck pain and neck stiffness.   Eyes: Negative for pain.  Respiratory: Negative for shortness of breath.   Cardiovascular: Negative for chest pain.  Gastrointestinal: Positive for vomiting and abdominal pain. Negative for blood in stool.  Genitourinary: Negative for dysuria.  Musculoskeletal: Negative for back pain.  Skin: Negative for rash.  Neurological: Negative for headaches.  All other systems reviewed and are negative.    Allergies  Sulfa antibiotics  Home Medications   Current Outpatient Rx  Name  Route  Sig  Dispense  Refill  . aspirin 81 MG chewable tablet   Oral   Chew 81 mg by mouth every morning.         Marland Kitchen atorvastatin  (LIPITOR) 20 MG tablet   Oral   Take 10-20 mg by mouth daily. Alternate 10 mg (1/2 tablet) with 20 mg (1 tablet ) every other day         . BESIVANCE 0.6 % SUSP               . calcium-vitamin D (OSCAL WITH D) 500-200 MG-UNIT per tablet   Oral   Take 2 tablets by mouth every morning.         . gabapentin (NEURONTIN) 100 MG capsule   Oral   Take 1 capsule (100 mg total) by mouth 3 (three) times daily.   270 capsule   1   . Memantine HCl ER (NAMENDA XR TITRATION PACK) 7 & 14 & 21 &28 MG CP24   Oral   Take 1 each by mouth daily.   1 capsule   0   . Memantine HCl ER (NAMENDA XR) 28 MG CP24   Oral   Take 28 mg by mouth daily.   30 capsule   12   . mirabegron ER (MYRBETRIQ) 25 MG TB24   Oral   Take 25 mg by mouth daily.         . Multiple Vitamin (MULTIVITAMIN WITH MINERALS) TABS  Oral   Take 1 tablet by mouth daily.         . polyethylene glycol (MIRALAX / GLYCOLAX) packet   Oral   Take 17 g by mouth daily.   14 each   0   . prednisoLONE acetate (PRED FORTE) 1 % ophthalmic suspension               . PROLENSA 0.07 % SOLN               . propranolol ER (INDERAL LA) 120 MG 24 hr capsule   Oral   Take 120 mg by mouth every morning.          . simethicone (GAS-X) 80 MG chewable tablet   Oral   Chew 1 tablet (80 mg total) by mouth every 6 (six) hours as needed for flatulence.   30 tablet   0    BP 144/81  Pulse 60  Temp(Src) 98.1 F (36.7 C) (Oral)  Resp 18  Ht 5' (1.524 m)  Wt 122 lb (55.339 kg)  BMI 23.83 kg/m2  SpO2 98% Physical Exam  Constitutional: She is oriented to person, place, and time. She appears well-developed and well-nourished.  HENT:  Head: Normocephalic and atraumatic.  Eyes: EOM are normal. Pupils are equal, round, and reactive to light.  Neck: Neck supple.  Cardiovascular: Normal rate, regular rhythm and intact distal pulses.   Pulmonary/Chest: Effort normal. No respiratory distress.  Abdominal: Soft.  Mild  diffuse tenderness, dec BS  Musculoskeletal: Normal range of motion. She exhibits no edema.  Neurological: She is alert and oriented to person, place, and time.  Skin: Skin is warm and dry.    ED Course   Procedures (including critical care time)  Results for orders placed during the hospital encounter of 06/25/13  CBC WITH DIFFERENTIAL      Result Value Range   WBC 9.1  4.0 - 10.5 K/uL   RBC 4.09  3.87 - 5.11 MIL/uL   Hemoglobin 13.4  12.0 - 15.0 g/dL   HCT 16.6  06.3 - 01.6 %   MCV 98.3  78.0 - 100.0 fL   MCH 32.8  26.0 - 34.0 pg   MCHC 33.3  30.0 - 36.0 g/dL   RDW 01.0  93.2 - 35.5 %   Platelets 266  150 - 400 K/uL   Neutrophils Relative % 81 (*) 43 - 77 %   Neutro Abs 7.4  1.7 - 7.7 K/uL   Lymphocytes Relative 13  12 - 46 %   Lymphs Abs 1.2  0.7 - 4.0 K/uL   Monocytes Relative 6  3 - 12 %   Monocytes Absolute 0.5  0.1 - 1.0 K/uL   Eosinophils Relative 1  0 - 5 %   Eosinophils Absolute 0.1  0.0 - 0.7 K/uL   Basophils Relative 0  0 - 1 %   Basophils Absolute 0.0  0.0 - 0.1 K/uL  COMPREHENSIVE METABOLIC PANEL      Result Value Range   Sodium 140  135 - 145 mEq/L   Potassium 3.8  3.5 - 5.1 mEq/L   Chloride 106  96 - 112 mEq/L   CO2 26  19 - 32 mEq/L   Glucose, Bld 148 (*) 70 - 99 mg/dL   BUN 18  6 - 23 mg/dL   Creatinine, Ser 7.32  0.50 - 1.10 mg/dL   Calcium 9.7  8.4 - 20.2 mg/dL   Total Protein 6.7  6.0 - 8.3 g/dL  Albumin 3.9  3.5 - 5.2 g/dL   AST 21  0 - 37 U/L   ALT 19  0 - 35 U/L   Alkaline Phosphatase 75  39 - 117 U/L   Total Bilirubin 0.7  0.3 - 1.2 mg/dL   GFR calc non Af Amer 67 (*) >90 mL/min   GFR calc Af Amer 77 (*) >90 mL/min  LIPASE, BLOOD      Result Value Range   Lipase 53  11 - 59 U/L  URINALYSIS, ROUTINE W REFLEX MICROSCOPIC      Result Value Range   Color, Urine YELLOW  YELLOW   APPearance CLEAR  CLEAR   Specific Gravity, Urine 1.032 (*) 1.005 - 1.030   pH 7.0  5.0 - 8.0   Glucose, UA NEGATIVE  NEGATIVE mg/dL   Hgb urine dipstick  NEGATIVE  NEGATIVE   Bilirubin Urine NEGATIVE  NEGATIVE   Ketones, ur 15 (*) NEGATIVE mg/dL   Protein, ur NEGATIVE  NEGATIVE mg/dL   Urobilinogen, UA 1.0  0.0 - 1.0 mg/dL   Nitrite NEGATIVE  NEGATIVE   Leukocytes, UA TRACE (*) NEGATIVE  URINE MICROSCOPIC-ADD ON      Result Value Range   WBC, UA 0-2  <3 WBC/hpf   Bacteria, UA RARE  RARE   Casts HYALINE CASTS (*) NEGATIVE   Urine-Other AMORPHOUS URATES/PHOSPHATES    POCT I-STAT, CHEM 8      Result Value Range   Sodium 143  135 - 145 mEq/L   Potassium 4.2  3.5 - 5.1 mEq/L   Chloride 108  96 - 112 mEq/L   BUN 21  6 - 23 mg/dL   Creatinine, Ser 1.61  0.50 - 1.10 mg/dL   Glucose, Bld 096 (*) 70 - 99 mg/dL   Calcium, Ion 0.45  4.09 - 1.30 mmol/L   TCO2 25  0 - 100 mmol/L   Hemoglobin 14.3  12.0 - 15.0 g/dL   HCT 81.1  91.4 - 78.2 %   Ct Abdomen Pelvis W Contrast  06/25/2013   *RADIOLOGY REPORT*  Clinical Data: Mid abdominal pain with nausea and vomiting. History of breast and uterine cancer.  CT ABDOMEN AND PELVIS WITH CONTRAST  Technique:  Multidetector CT imaging of the abdomen and pelvis was performed following the standard protocol during bolus administration of intravenous contrast.  Contrast: 50mL OMNIPAQUE IOHEXOL 300 MG/ML  SOLN, OMNIPAQUE IOHEXOL 300 MG/ML  SOLN  Comparison: 11/22/2012.  Findings: Lung bases show no acute findings.  The distal esophagus is dilated and fluid-filled.  Heart size normal.  No pericardial or pleural effusion.  Scattered sub centimeter low attenuation lesions in the liver are unchanged.  Liver, gallbladder, adrenal glands, kidneys, spleen, pancreas, stomach and proximal small bowel are unremarkable.  There is mild dilatation of distal small bowel loops with slight transition to normal caliber small bowel in the anatomic pelvis (series 2, image 61). Decompressed distal small bowel in the anatomic pelvis appears minimally thickened.  Appendix and colon are unremarkable.  Small ascites with scattered  interloop fluid. No free air.  Hysterectomy.  Scattered atherosclerotic calcification of the arterial vasculature without abdominal aortic aneurysm.  No pathologically enlarged lymph nodes.  No worrisome lytic or sclerotic lesions.  Degenerative changes are seen in the spine. Minimal grade 1 anterolisthesis of L4 on L5 and L5 on S1, unchanged.  IMPRESSION:  1.  Minimally dilated loops of distal small bowel with possible transition in the right anatomic pelvis.  Findings suggest a partial or  developing early/mild small bowel obstruction. Decompressed pelvic small bowel loops appear mildly thickened. 2.  Small ascites.   Original Report Authenticated By: Leanna Battles, M.D.    MED c/s for admit- no emesis in ED, holding NGT for now confirmed with admitting MD D/w GSU, will follow MDM  SBO Labs, IVFs, IV narcotics MED admit  Sunnie Nielsen, MD 06/25/13 [phone]

## 2013-06-25 NOTE — H&P (Signed)
[facility] History and Physical  Laura Perkins YQM:[id number] DOB: September 27, 1935 DOA: 06/25/2013  Referring physician: Dr. Dierdre Highman PCP: Katy Apo, MD   Chief Complaint: N/V, abdominal pain  HPI: Laura Perkins is a 78 y.o. female with PMH of HTN, hx of left breast carcinosarcoma, HLD and prior hx of ileus due to adhesions. Came to the hospital secondary to sudden onset abd pain with associated nausea and vomiting. Symptoms has been present for 1 days prior to admission, patient reports worsening course for. Denies sick contacts and new food. Patient reprots last BM was 1 days priro to admission. Patient has been twice in the hospital secondary to SBO due to adhesions.  In the ED CT abdomen demonstrated partial SBO and internal medicine along with [facility] has been called to admit patient for further evaluation and treatment.  Review of Systems:  Negative except as otherwise mentioned on HPI  Past Medical History  Diagnosis Date  . Hypertension   . Overactive bladder   . High blood pressure   . Cancer     carcinoma sarcoma, left breast   . High cholesterol   . Ileus   . Abdominal adhesions    Past Surgical History  Procedure Laterality Date  . Abdominal hysterectomy    . Breast biopsy    . Ankle surgery    . Hernia repair      as a child   Social History:  reports that she has never smoked. She has never used smokeless tobacco. She reports that she does not drink alcohol or use illicit drugs.  Allergies  Allergen Reactions  . Sulfa Antibiotics Anaphylaxis    Family History  Problem Relation Age of Onset  . Cancer Father     pancreatic     Prior to Admission medications   Medication Sig Start Date End Date Taking? Authorizing Provider  aspirin 81 MG chewable tablet Chew 81 mg by mouth every morning.   Yes Historical Provider, MD  atorvastatin (LIPITOR) 20 MG tablet Take 10-20 mg by mouth daily. Alternate 10 mg (1/2 tablet) with 20 mg (1 tablet ) every other  day 07/28/12  Yes Historical Provider, MD  calcium-vitamin D (OSCAL WITH D) 500-200 MG-UNIT per tablet Take 2 tablets by mouth every morning.   Yes Historical Provider, MD  gabapentin (NEURONTIN) 100 MG capsule Take 1 capsule (100 mg total) by mouth 3 (three) times daily. 06/17/13  Yes Levert Feinstein, MD  Memantine HCl ER (NAMENDA XR) 28 MG CP24 Take 28 mg by mouth daily. 03/09/13  Yes Levert Feinstein, MD  mirabegron ER [facility]) 25 MG TB24 Take 25 mg by mouth every Monday, Wednesday, and Friday.    Yes Historical Provider, MD  Multiple Vitamin (MULTIVITAMIN WITH MINERALS) TABS Take 1 tablet by mouth daily.   Yes Historical Provider, MD  polyethylene glycol (MIRALAX / GLYCOLAX) packet Take 17 g by mouth daily. 11/24/12  Yes Alison Murray, MD  polyethylene glycol [facility] Ethelene Hal) packet Take 17 g by mouth daily as needed. For constipation   Yes Historical Provider, MD  propranolol ER (INDERAL LA) 120 MG 24 hr capsule Take 120 mg by mouth every morning.  08/12/12  Yes Historical Provider, MD  simethicone (GAS-X) 80 MG chewable tablet Chew 1 tablet (80 mg total) by mouth every 6 (six) hours as needed for flatulence. 11/24/12  Yes Alison Murray, MD   Physical Exam: Filed Vitals:   06/25/13 1450  BP: 105/66  Pulse: 56  Temp: 98.6 F (  37 C)  Resp: 18     General:  AAOX3, no fever, cooperative to examination. Comfortable and currently w/o N/V  Eyes: PERRL, EOMI, no icterus, no nystagmus,   ENT: slight dry mucosae membranes, no erythema or exudates inside her mouth, no thrush  Neck: supple, no JVD  Cardiovascular: S1 and S2, no rubs or gallops  Respiratory: CTA bilaterally  Abdomen: soft, mild distension, slight tenderness with deep palpation; no guarding  Skin: no rash or petechiae  Musculoskeletal: no joint swelling or erythema  Psychiatric: appropriate  Neurologic: AAOX3, CN intact, no focal motor or sensory deficit on exam.  Labs on Admission:  Basic Metabolic Panel:  Recent Labs Lab  06/25/13 0415 06/25/13 0429  NA 140 143  K 3.8 4.2  CL 106 108  CO2 26  --   GLUCOSE 148* 146*  BUN 18 21  CREATININE 0.83 0.90  CALCIUM 9.7  --    Liver Function Tests:  Recent Labs Lab 06/25/13 0415  AST 21  ALT 19  ALKPHOS 75  BILITOT 0.7  PROT 6.7  ALBUMIN 3.9    Recent Labs Lab 06/25/13 0415  LIPASE 53   CBC:  Recent Labs Lab 06/25/13 0415 06/25/13 0429  WBC 9.1  --   NEUTROABS 7.4  --   HGB 13.4 14.3  HCT 40.2 42.0  MCV 98.3  --   PLT 266  --    Radiological Exams on Admission: Ct Abdomen Pelvis W Contrast  06/25/2013   *RADIOLOGY REPORT*  Clinical Data: Mid abdominal pain with nausea and vomiting. History of breast and uterine cancer.  CT ABDOMEN AND PELVIS WITH CONTRAST  Technique:  Multidetector CT imaging of the abdomen and pelvis was performed following the standard protocol during bolus administration of intravenous contrast.  Contrast: 50mL OMNIPAQUE IOHEXOL 300 MG/ML  SOLN, OMNIPAQUE IOHEXOL 300 MG/ML  SOLN  Comparison: 11/22/2012.  Findings: Lung bases show no acute findings.  The distal esophagus is dilated and fluid-filled.  Heart size normal.  No pericardial or pleural effusion.  Scattered sub centimeter low attenuation lesions in the liver are unchanged.  Liver, gallbladder, adrenal glands, kidneys, spleen, pancreas, stomach and proximal small bowel are unremarkable.  There is mild dilatation of distal small bowel loops with slight transition to normal caliber small bowel in the anatomic pelvis (series 2, image 61). Decompressed distal small bowel in the anatomic pelvis appears minimally thickened.  Appendix and colon are unremarkable.  Small ascites with scattered interloop fluid. No free air.  Hysterectomy.  Scattered atherosclerotic calcification of the arterial vasculature without abdominal aortic aneurysm.  No pathologically enlarged lymph nodes.  No worrisome lytic or sclerotic lesions.  Degenerative changes are seen in the spine. Minimal  grade 1 anterolisthesis of L4 on L5 and L5 on S1, unchanged.  IMPRESSION:  1.  Minimally dilated loops of distal small bowel with possible transition in the right anatomic pelvis.  Findings suggest a partial or developing early/mild small bowel obstruction. Decompressed pelvic small bowel loops appear mildly thickened. 2.  Small ascites.   Original Report Authenticated By: Leanna Battles, M.D.    Assessment/Plan 1-Partial SBO (small bowel obstruction): patient with abd. Pain and associated N/V. -[facility] has been consulted by ED will follow their rec's (currently no indication for surgical intervention) -conservative management with bowel rest, analgesics and antiemetics -patient is not actively vomiting or feeling nauseated at this moment. Will hold on NGT; but discussed with patient its needs if symptoms worsen. CT w/o significant dilatation. -IV PPI  -  holding oral medications due to NPO status. -monitor and repelte electrolytes as needed.  2-HTN (hypertension): stable. Will use PRN IV hydralazine PRN for BP control  3-Other and unspecified hyperlipidemia: patient was following low fat diet as an outpatient and statins. Will resume when taking PO again.  4-Alzheimer's disease: will resume namenda when tolerating PO again.  WUJ:[id number].  Surgery (Dr. Biagio Quint)  Code Status: Full Family Communication: no family at bedside Disposition Plan: med-surg bed, LOS > 2 midnights; inpatient  Time spent: 50 minutes  Jakwan Sally [facility] Pager [phone]  If 7PM-7AM, please contact night-coverage [email or web address] Password [facility] 06/25/2013, 2:54 PM

## 2013-06-25 NOTE — ED Notes (Signed)
Pt c/o mid abdominal pain onset 2030 last night, +nausea, vomiting. Denies diarrhea.

## 2013-06-26 ENCOUNTER — Inpatient Hospital Stay (HOSPITAL_COMMUNITY): Payer: Medicare Other

## 2013-06-26 LAB — COMPREHENSIVE METABOLIC PANEL
AST: 16 U/L (ref 0–37)
CO2: 27 mEq/L (ref 19–32)
Calcium: 8.6 mg/dL (ref 8.4–10.5)
Creatinine, Ser: 0.9 mg/dL (ref 0.50–1.10)
GFR calc Af Amer: 70 mL/min — ABNORMAL LOW (ref >90)
GFR calc non Af Amer: 61 mL/min — ABNORMAL LOW (ref >90)
Glucose, Bld: 110 mg/dL — ABNORMAL HIGH (ref 70–99)
Total Protein: 5 g/dL — ABNORMAL LOW (ref 6.0–8.3)

## 2013-06-26 LAB — CBC
MCH: 32.7 pg (ref 26.0–34.0)
MCHC: 32.8 g/dL (ref 30.0–36.0)
MCV: 99.7 fL (ref 78.0–100.0)
Platelets: 223 10*3/uL (ref 150–400)
RBC: 3.33 MIL/uL — ABNORMAL LOW (ref 3.87–5.11)

## 2013-06-26 NOTE — Progress Notes (Signed)
Subjective: Admission history and physical review, patient admitted with abdominal pain nausea vomiting. CT suggestive of bowel obstruction. Obviously NG tube was not required. Patient has been without emesis since admission. She states she's had a bowel movement this morning. There is no fever no chills.  Objective: Vital signs in last 24 hours: Temp:  [98.2 F (36.8 C)-98.6 F (37 C)] 98.2 F (36.8 C) (08/15 0600) Pulse Rate:  [56-66] 65 (08/15 0600) Resp:  [18] 18 (08/15 0600) BP: (105-131)/(66-78) 131/73 mmHg (08/15 0600) SpO2:  [94 %-98 %] 96 % (08/15 0600) Weight:  [59 kg (130 lb 1.1 oz)] 59 kg (130 lb 1.1 oz) (08/14 0935) Weight change: 3.661 kg (8 lb 1.1 oz)    Intake/Output from previous day: 08/14 0701 - 08/15 0700 In: 1605 [I.V.:1605] Out: -  Intake/Output this shift:    General appearance: alert and cooperative Resp: clear to auscultation bilaterally Cardio: regular rate and rhythm, S1, S2 normal, no murmur, click, rub or gallop GI: soft, non-tender; bowel sounds normal; no masses,  no organomegaly Extremities: extremities normal, atraumatic, no cyanosis or edema  Lab Results:  Results for orders placed during the hospital encounter of 06/25/13 (from the past 24 hour(s))  CBC     Status: Abnormal   Collection Time    06/26/13  5:45 AM      Result Value Range   WBC 5.5  4.0 - 10.5 K/uL   RBC 3.33 (*) 3.87 - 5.11 MIL/uL   Hemoglobin 10.9 (*) 12.0 - 15.0 g/dL   HCT 78.4 (*) 69.6 - 29.5 %   MCV 99.7  78.0 - 100.0 fL   MCH 32.7  26.0 - 34.0 pg   MCHC 32.8  30.0 - 36.0 g/dL   RDW 28.4  13.2 - 44.0 %   Platelets 223  150 - 400 K/uL  COMPREHENSIVE METABOLIC PANEL     Status: Abnormal   Collection Time    06/26/13  5:45 AM      Result Value Range   Sodium 145  135 - 145 mEq/L   Potassium 4.0  3.5 - 5.1 mEq/L   Chloride 114 (*) 96 - 112 mEq/L   CO2 27  19 - 32 mEq/L   Glucose, Bld 110 (*) 70 - 99 mg/dL   BUN 8  6 - 23 mg/dL   Creatinine, Ser 1.02  0.50 - 1.10  mg/dL   Calcium 8.6  8.4 - 72.5 mg/dL   Total Protein 5.0 (*) 6.0 - 8.3 g/dL   Albumin 2.7 (*) 3.5 - 5.2 g/dL   AST 16  0 - 37 U/L   ALT 13  0 - 35 U/L   Alkaline Phosphatase 52  39 - 117 U/L   Total Bilirubin 0.6  0.3 - 1.2 mg/dL   GFR calc non Af Amer 61 (*) >90 mL/min   GFR calc Af Amer 70 (*) >90 mL/min      Studies/Results: Ct Abdomen Pelvis W Contrast  06/25/2013   *RADIOLOGY REPORT*  Clinical Data: Mid abdominal pain with nausea and vomiting. History of breast and uterine cancer.  CT ABDOMEN AND PELVIS WITH CONTRAST  Technique:  Multidetector CT imaging of the abdomen and pelvis was performed following the standard protocol during bolus administration of intravenous contrast.  Contrast: 50mL OMNIPAQUE IOHEXOL 300 MG/ML  SOLN, OMNIPAQUE IOHEXOL 300 MG/ML  SOLN  Comparison: 11/22/2012.  Findings: Lung bases show no acute findings.  The distal esophagus is dilated and fluid-filled.  Heart size normal.  No pericardial or pleural effusion.  Scattered sub centimeter low attenuation lesions in the liver are unchanged.  Liver, gallbladder, adrenal glands, kidneys, spleen, pancreas, stomach and proximal small bowel are unremarkable.  There is mild dilatation of distal small bowel loops with slight transition to normal caliber small bowel in the anatomic pelvis (series 2, image 61). Decompressed distal small bowel in the anatomic pelvis appears minimally thickened.  Appendix and colon are unremarkable.  Small ascites with scattered interloop fluid. No free air.  Hysterectomy.  Scattered atherosclerotic calcification of the arterial vasculature without abdominal aortic aneurysm.  No pathologically enlarged lymph nodes.  No worrisome lytic or sclerotic lesions.  Degenerative changes are seen in the spine. Minimal grade 1 anterolisthesis of L4 on L5 and L5 on S1, unchanged.  IMPRESSION:  1.  Minimally dilated loops of distal small bowel with possible transition in the right anatomic pelvis.  Findings  suggest a partial or developing early/mild small bowel obstruction. Decompressed pelvic small bowel loops appear mildly thickened. 2.  Small ascites.   Original Report Authenticated By: Leanna Battles, M.D.    Medications:  Prior to Admission:  Prescriptions prior to admission  Medication Sig Dispense Refill  . aspirin 81 MG chewable tablet Chew 81 mg by mouth every morning.      Marland Kitchen atorvastatin (LIPITOR) 20 MG tablet Take 10-20 mg by mouth daily. Alternate 10 mg (1/2 tablet) with 20 mg (1 tablet ) every other day      . calcium-vitamin D (OSCAL WITH D) 500-200 MG-UNIT per tablet Take 2 tablets by mouth every morning.      . gabapentin (NEURONTIN) 100 MG capsule Take 1 capsule (100 mg total) by mouth 3 (three) times daily.  270 capsule  1  . Memantine HCl ER (NAMENDA XR) 28 MG CP24 Take 28 mg by mouth daily.  30 capsule  12  . mirabegron ER (MYRBETRIQ) 25 MG TB24 Take 25 mg by mouth every Monday, Wednesday, and Friday.       . Multiple Vitamin (MULTIVITAMIN WITH MINERALS) TABS Take 1 tablet by mouth daily.      . polyethylene glycol (MIRALAX / GLYCOLAX) packet Take 17 g by mouth daily.  14 each  0  . polyethylene glycol (MIRALAX / GLYCOLAX) packet Take 17 g by mouth daily as needed. For constipation      . propranolol ER (INDERAL LA) 120 MG 24 hr capsule Take 120 mg by mouth every morning.       . simethicone (GAS-X) 80 MG chewable tablet Chew 1 tablet (80 mg total) by mouth every 6 (six) hours as needed for flatulence.  30 tablet  0   Scheduled: . heparin  5,000 Units Subcutaneous Q8H  . ondansetron  4 mg Intravenous Q6H  . pantoprazole (PROTONIX) IV  40 mg Intravenous Q24H   Continuous: . dextrose 5 % and 0.9 % NaCl with KCl 20 mEq/L 100 mL/hr at 06/25/13 2207    Assessment/Plan: Active Problems:   SBO (small bowel obstruction)   HTN (hypertension)   Other and unspecified hyperlipidemia   Cognitive dysfunction, currently followed by neurology, on Namenda  Clinically patient  improved, await followup x-ray, advance diet once cleared by surgery. Disposition pending tolerance of diet, resolution of her abdominal pain and bowel obstruction     LOS: 1 day   Zacary Bauer D 06/26/2013, 8:43 AM

## 2013-06-26 NOTE — Progress Notes (Signed)
Subjective: Pt feeling better today, had 2 bms  This morning.  Now has RLQ pain characterized as "gas pains" without radiation.  Denies fever, chills or sweats.  Denies n/v.    Objective: Vital signs in last 24 hours: Temp:  [98.2 F (36.8 C)-98.6 F (37 C)] 98.2 F (36.8 C) (08/15 0600) Pulse Rate:  [56-65] 65 (08/15 0600) Resp:  [18] 18 (08/15 0600) BP: (105-131)/(66-78) 131/73 mmHg (08/15 0600) SpO2:  [96 %-98 %] 96 % (08/15 0600)    Intake/Output from previous day: 08/14 0701 - 08/15 0700 In: 1605 [I.V.:1605] Out: -  Intake/Output this shift:   Physical Exam  Constitutional: She is oriented to person, place, and time. She appears well-developed and well-nourished. No distress.  Cardiovascular: Normal rate, regular rhythm, normal heart sounds and intact distal pulses. Exam reveals no gallop and no friction rub.  No murmur heard.  Respiratory: Effort normal and breath sounds normal. No respiratory distress. She has no wheezes. She exhibits no tenderness.  GI: Bowel sounds are normal. She exhibits no mass. There is no guarding.  Mild RLQ tenderness, no distention.  no evidence of peritonitis  Neurological: She is oriented to person, place, and time.  Skin: Skin is warm and dry. No rash noted. She is not diaphoretic. No erythema. No pallor.  Psychiatric: She has a normal mood and affect. Her behavior is normal. Judgment and thought content normal.    Lab Results:   Recent Labs  06/25/13 0415 06/25/13 0429 06/26/13 0545  WBC 9.1  --  5.5  HGB 13.4 14.3 10.9*  HCT 40.2 42.0 33.2*  PLT 266  --  223   BMET  Recent Labs  06/25/13 0415 06/25/13 0429 06/26/13 0545  NA 140 143 145  K 3.8 4.2 4.0  CL 106 108 114*  CO2 26  --  27  GLUCOSE 148* 146* 110*  BUN 18 21 8   CREATININE 0.83 0.90 0.90  CALCIUM 9.7  --  8.6   PT/INR No results found for this basename: LABPROT, INR,  in the last 72 hours ABG No results found for this basename: PHART, PCO2, PO2, HCO3,   in the last 72 hours  Studies/Results: Dg Abd 1 View  06/26/2013   *RADIOLOGY REPORT*  Clinical Data: Lower abdominal tenderness.  Small bowel obstruction.  ABDOMEN - 1 VIEW  Comparison: 06/25/2013  Findings: Contrast medium presumably from yesterday's CT scan is present in the transverse and descending colon.  Borderline dilated small bowel loop in the right abdomen.  Upper normal size small bowel loop in the left upper quadrant.  IMPRESSION:  1.  Contrast medium has made its way into the colon, accordingly high-grade obstruction is doubtful.  Borderline prominent right sided small bowel loop incidentally noted.   Original Report Authenticated By: Gaylyn Rong, M.D.   Ct Abdomen Pelvis W Contrast  06/25/2013   *RADIOLOGY REPORT*  Clinical Data: Mid abdominal pain with nausea and vomiting. History of breast and uterine cancer.  CT ABDOMEN AND PELVIS WITH CONTRAST  Technique:  Multidetector CT imaging of the abdomen and pelvis was performed following the standard protocol during bolus administration of intravenous contrast.  Contrast: 50mL OMNIPAQUE IOHEXOL 300 MG/ML  SOLN, OMNIPAQUE IOHEXOL 300 MG/ML  SOLN  Comparison: 11/22/2012.  Findings: Lung bases show no acute findings.  The distal esophagus is dilated and fluid-filled.  Heart size normal.  No pericardial or pleural effusion.  Scattered sub centimeter low attenuation lesions in the liver are unchanged.  Liver, gallbladder, adrenal  glands, kidneys, spleen, pancreas, stomach and proximal small bowel are unremarkable.  There is mild dilatation of distal small bowel loops with slight transition to normal caliber small bowel in the anatomic pelvis (series 2, image 61). Decompressed distal small bowel in the anatomic pelvis appears minimally thickened.  Appendix and colon are unremarkable.  Small ascites with scattered interloop fluid. No free air.  Hysterectomy.  Scattered atherosclerotic calcification of the arterial vasculature without abdominal  aortic aneurysm.  No pathologically enlarged lymph nodes.  No worrisome lytic or sclerotic lesions.  Degenerative changes are seen in the spine. Minimal grade 1 anterolisthesis of L4 on L5 and L5 on S1, unchanged.  IMPRESSION:  1.  Minimally dilated loops of distal small bowel with possible transition in the right anatomic pelvis.  Findings suggest a partial or developing early/mild small bowel obstruction. Decompressed pelvic small bowel loops appear mildly thickened. 2.  Small ascites.   Original Report Authenticated By: Leanna Battles, M.D.    Anti-infectives: Anti-infectives   None      Assessment/Plan: Partial small bowel obstruction Appears resolved clinically, follow up x-rays improved.  Some concern with RLQ pain, but no white count.  Start on clears and advance as tolerated.  Hopefully discharge soon.  I left a message yesterday for Dr. Porfirio Mylar oncology), pt recently seen in office and has follow up radiologic imaging coming up soon.   LOS: 1 day    Ashok Norris ANP-BC Pager [phone]  06/26/2013 10:56 AM  No nausea or vomiting.  Contrast in colon.  No evidence of obstruction

## 2013-06-26 NOTE — Progress Notes (Signed)
INITIAL NUTRITION ASSESSMENT  DOCUMENTATION CODES Per approved criteria  -Not Applicable   INTERVENTION: - Pt had questions about following low versus high fiber diet for bowel obstructions. Instructed pt on low fiber diet but also stated that it is not likely diet causing recurrent obstructions but more likely r/t pt's intestinal adhesions.  - Diet advancement per MD - Will continue to monitor   NUTRITION DIAGNOSIS: Inadequate oral intake related to clear liquid diet as evidenced by diet order.   Goal: Advance diet as tolerated to low fiber diet  Monitor:  Weights, labs, diet advancement  Reason for Assessment: Consult   78 y.o. female  Admitting Dx: Nausea/vomiting, abdominal pain  ASSESSMENT: Pt known to RD from previous admission. Pt with history of HTN, breast CA with radiation, and ileus from adhesions. Pt with 2 admissions r/t small bowel obstruction from adhesions. Pt starting having nausea/vomiting Wednesday night until Thursday morning with abdominal pain. Pt reports typically eating well with good appetite and stable weight between 120-125 pounds. Pt has been following low fiber diet at home. Pt states she could tell there was something wrong when she started gaining weight. Pt denies any nausea/vomiting today and reports tolerating coffee well this morning. Pt found to have partial small bowel obstruction that appears to be resolved clinically per surgery's notes today.   Height: Ht Readings from Last 1 Encounters:  06/25/13 5' (1.524 m)    Weight: Wt Readings from Last 1 Encounters:  06/25/13 130 lb 1.1 oz (59 kg)    Ideal Body Weight: 100 lb   % Ideal Body Weight: 130%  Wt Readings from Last 10 Encounters:  06/25/13 130 lb 1.1 oz (59 kg)  03/09/13 124 lb 8 oz (56.473 kg)  11/22/12 143 lb 1.6 oz (64.91 kg)  09/30/12 145 lb (65.772 kg)    Usual Body Weight: 124 lb per pt   % Usual Body Weight: 105%  BMI:  Body mass index is 25.4 kg/(m^2).  Estimated  Nutritional Needs: Kcal: 1200-1500 Protein: 60-70g Fluid: 1.2-1.5L/day  Skin: Intact   Diet Order: Clear Liquid  EDUCATION NEEDS: -Education needs addressed - reviewed low fiber diet with pt and provided handouts of this information    Intake/Output Summary (Last 24 hours) at 06/26/13 1305 Last data filed at 06/26/13 0500  Gross per 24 hour  Intake   1605 ml  Output      0 ml  Net   1605 ml    Last BM: PTA   Labs:   Recent Labs Lab 06/25/13 0415 06/25/13 0429 06/26/13 0545  NA 140 143 145  K 3.8 4.2 4.0  CL 106 108 114*  CO2 26  --  27  BUN 18 21 8   CREATININE 0.83 0.90 0.90  CALCIUM 9.7  --  8.6  MG 2.0  --   --   PHOS 2.8  --   --   GLUCOSE 148* 146* 110*    CBG (last 3)  No results found for this basename: GLUCAP,  in the last 72 hours  Scheduled Meds: . heparin  5,000 Units Subcutaneous Q8H  . ondansetron  4 mg Intravenous Q6H  . pantoprazole (PROTONIX) IV  40 mg Intravenous Q24H    Continuous Infusions: . dextrose 5 % and 0.9 % NaCl with KCl 20 mEq/L 100 mL/hr at 06/25/13 2207    Past Medical History  Diagnosis Date  . Hypertension   . Overactive bladder   . High blood pressure   . Cancer  carcinoma sarcoma, left breast   . High cholesterol   . Ileus   . Abdominal adhesions     Past Surgical History  Procedure Laterality Date  . Abdominal hysterectomy    . Breast biopsy    . Ankle surgery    . Hernia repair      as a child    Levon Hedger MS, RD, LDN [phone] Pager [phone] After Hours Pager

## 2013-06-27 MED ORDER — SODIUM CHLORIDE 0.9 % IJ SOLN
10.0000 mL | INTRAMUSCULAR | Status: DC | PRN
  Administered 2013-06-27: 10 mL

## 2013-06-27 MED ORDER — HEPARIN SOD (PORK) LOCK FLUSH 100 UNIT/ML IV SOLN
500.0000 [IU] | INTRAVENOUS | Status: AC | PRN
  Administered 2013-06-27: 500 [IU]

## 2013-06-27 MED ORDER — HEPARIN SOD (PORK) LOCK FLUSH 100 UNIT/ML IV SOLN
500.0000 [IU] | Freq: Once | INTRAVENOUS | Status: DC
  Filled 2013-06-27: qty 5

## 2013-06-27 MED ORDER — SODIUM CHLORIDE 0.9 % IJ SOLN: 10.0000 mL | Freq: Two times a day (BID) | INTRAMUSCULAR | Status: DC

## 2013-06-27 MED ORDER — HEPARIN 1 UNIT/ML CVL/PCVC NICU FLUSH: 0.5000 mL | INJECTION | INTRAVENOUS | Status: DC | PRN

## 2013-06-27 NOTE — Discharge Summary (Signed)
Physician Discharge Summary  Patient ID: Laura Perkins MRN: [id number] DOB/AGE: Dec 07, 1934 78 y.o.  Admit date: 06/25/2013 Discharge date: 06/27/2013  Admission Diagnoses: Partial small bowel obstruction Hypertension Alzheimer's disease  Discharge Diagnoses:  Active Problems:   SBO (small bowel obstruction)   HTN (hypertension)   Other and unspecified hyperlipidemia   Alzheimer's disease   Discharged Condition: good  Hospital Course: The patient was admitted on August 14 with sudden onset of a lower abdominal pain associated with nausea vomiting 1 day prior to admission. In emergency the skin the abdomen showed partial small bowel obstruction. The patient was admitted and treated conservatively. Followup plain film of the abdomen on August 15 did not show high-grade obstruction. The patient tolerated clear liquids. She began having multiple bowel movements. On the day of discharge her pain was much better and only mild tenderness in her lower abdomen.  Consults: general surgery  Significant Diagnostic Studies: labs: Sodium 145, potassium 4.0, chloride 114, bicarbonate 27, BUN 8, creatinine 0.9 and radiology: CT scan: As above  Treatments: IV hydration  Discharge Exam: Blood pressure 147/81, pulse 69, temperature 98.5 F (36.9 C), temperature source Oral, resp. rate 18, height 5' (1.524 m), weight 59 kg (130 lb 1.1 oz), SpO2 99.00%. Resp: clear to auscultation bilaterally Cardio: regular rate and rhythm, S1, S2 normal, no murmur, click, rub or gallop GI: Mild lower mid abdomen tenderness without rebound or guarding and normal bowel sounds  Disposition: 01-Home or Self Care   Future Appointments Provider Department Dept Phone   09/08/2013 11:30 AM Nilda Riggs, NP [facility] [phone]       Medication List         aspirin 81 MG chewable tablet  Chew 81 mg by mouth every morning.     atorvastatin 20 MG tablet  Commonly known as:  LIPITOR   Take 10-20 mg by mouth daily. Alternate 10 mg (1/2 tablet) with 20 mg (1 tablet ) every other day     calcium-vitamin D 500-200 MG-UNIT per tablet  Commonly known as:  OSCAL WITH D  Take 2 tablets by mouth every morning.     gabapentin 100 MG capsule  Commonly known as:  NEURONTIN  Take 1 capsule (100 mg total) by mouth 3 (three) times daily.     Memantine HCl ER 28 MG Cp24  Commonly known as:  NAMENDA XR  Take 28 mg by mouth daily.     mirabegron ER 25 MG Tb24 tablet  Commonly known as:  MYRBETRIQ  Take 25 mg by mouth every Monday, Wednesday, and Friday.     multivitamin with minerals Tabs tablet  Take 1 tablet by mouth daily.     polyethylene glycol packet  Commonly known as:  MIRALAX / GLYCOLAX  Take 17 g by mouth daily.     polyethylene glycol packet  Commonly known as:  MIRALAX / GLYCOLAX  Take 17 g by mouth daily as needed. For constipation     propranolol ER 120 MG 24 hr capsule  Commonly known as:  INDERAL LA  Take 120 mg by mouth every morning.     simethicone 80 MG chewable tablet  Commonly known as:  GAS-X  Chew 1 tablet (80 mg total) by mouth every 6 (six) hours as needed for flatulence.           Follow-up Information   Follow up In 2 weeks.      Follow up with Katy Apo, MD.   Specialty:  Internal  Medicine   Contact information:   [address] [place] [address] [phone]       Signed: Lillia Mountain 06/27/2013, 11:16 AM

## 2013-06-27 NOTE — Progress Notes (Signed)
Patient ID: Laura Perkins, female   DOB: 09/08/1935, 78 y.o.   MRN: [id number]  General Surgery - [facility] - Progress Note  HD# 3  Subjective: Patient up in bed, family at bedside.  Hoping to go home today.  Having diarrheal stools - likely from CT contrast ingestion.  Objective: Vital signs in last 24 hours: Temp:  [98.3 F (36.8 C)-98.5 F (36.9 C)] 98.5 F (36.9 C) (08/16 0536) Pulse Rate:  [62-69] 69 (08/16 0536) Resp:  [18-20] 18 (08/16 0536) BP: (128-154)/(70-81) 147/81 mmHg (08/16 0536) SpO2:  [97 %-99 %] 99 % (08/16 0536) Last BM Date: 06/26/13  Intake/Output from previous day: 08/15 0701 - 08/16 0700 In: 2813.3 [P.O.:240; I.V.:2573.3] Out: -   Exam: HEENT - clear, not icteric Neck - soft Chest - clear bilaterally Cor - RRR, no murmur Abd - soft without distension, no tenderness Ext - no significant edema Neuro - grossly intact, no focal deficits  Lab Results:   Recent Labs  06/25/13 0415 06/25/13 0429 06/26/13 0545  WBC 9.1  --  5.5  HGB 13.4 14.3 10.9*  HCT 40.2 42.0 33.2*  PLT 266  --  223     Recent Labs  06/25/13 0415 06/25/13 0429 06/26/13 0545  NA 140 143 145  K 3.8 4.2 4.0  CL 106 108 114*  CO2 26  --  27  GLUCOSE 148* 146* 110*  BUN 18 21 8   CREATININE 0.83 0.90 0.90  CALCIUM 9.7  --  8.6    Studies/Results: Dg Abd 1 View  06/26/2013   *RADIOLOGY REPORT*  Clinical Data: Lower abdominal tenderness.  Small bowel obstruction.  ABDOMEN - 1 VIEW  Comparison: 06/25/2013  Findings: Contrast medium presumably from yesterday's CT scan is present in the transverse and descending colon.  Borderline dilated small bowel loop in the right abdomen.  Upper normal size small bowel loop in the left upper quadrant.  IMPRESSION:  1.  Contrast medium has made its way into the colon, accordingly high-grade obstruction is doubtful.  Borderline prominent right sided small bowel loop incidentally noted.   Original Report Authenticated  By: Gaylyn Rong, M.D.    Assessment / Plan: 1.  Small bowel obstruction - resolved  Advance to regular diet - ordered  May go home this afternoon if diet tolerated and OK with medical service  Will sign off - will see as needed  Velora Heckler, MD, [facility] Office: [phone]  06/27/2013

## 2013-07-30 ENCOUNTER — Ambulatory Visit (HOSPITAL_COMMUNITY)
Admission: RE | Admit: 2013-07-30 | Discharge: 2013-07-30 | Disposition: A | Discharge status: Home / Self Care | Payer: Medicare Other | Source: Ambulatory Visit | Attending: Obstetrics and Gynecology | Admitting: Obstetrics and Gynecology

## 2013-07-30 ENCOUNTER — Other Ambulatory Visit (HOSPITAL_COMMUNITY): Payer: Self-pay | Admitting: Obstetrics and Gynecology

## 2013-07-30 DIAGNOSIS — C541 Malignant neoplasm of endometrium: Secondary | ICD-10-CM

## 2013-07-30 DIAGNOSIS — C549 Malignant neoplasm of corpus uteri, unspecified: Secondary | ICD-10-CM | POA: Insufficient documentation

## 2013-09-07 ENCOUNTER — Telehealth: Payer: Self-pay | Admitting: *Deleted

## 2013-09-07 NOTE — Telephone Encounter (Signed)
Called patient to change appt time to 11am instead of 1130. Patient did not answer left a message about the time change. If patient can not make the appt she needs to call the office back and r/s.

## 2013-09-08 ENCOUNTER — Encounter: Payer: Self-pay | Admitting: Nurse Practitioner

## 2013-09-08 ENCOUNTER — Ambulatory Visit (INDEPENDENT_AMBULATORY_CARE_PROVIDER_SITE_OTHER): Payer: Medicare Other | Admitting: Nurse Practitioner

## 2013-09-08 VITALS — BP 120/72 | HR 59 | Ht 62.0 in | Wt 122.0 lb

## 2013-09-08 DIAGNOSIS — R413 Other amnesia: Secondary | ICD-10-CM

## 2013-09-08 MED ORDER — GABAPENTIN 100 MG PO CAPS: 200.0000 mg | ORAL_CAPSULE | Freq: Three times a day (TID) | ORAL | Status: DC

## 2013-09-08 NOTE — Progress Notes (Signed)
[facility]  PATIENT: Laura Perkins DOB: 1935-04-23   REASON FOR VISIT: Followup for memory loss and peripheral neuropathy    HISTORY OF PRESENT ILLNESS: Returns for followup after last visit with Dr. Terrace Arabia 4/ 28/ 2014. She has a history of memory loss MRI of the brain in 2011 showing generalized atrophy and small vessel disease. She also peripheral neuropathy presumed from her chemotherapy 2010. She is a previous patient of Dr.Love. Placed on Namenda at her last visit and stopped the medication due to dizziness.. She has been given Aricept in the past and had nightmares. She is currently taking a baby aspirin. Her memory score is stable today. She goes to [employer]  for exercise. She is having more tingling in the feet and legs which she notices when she is walking. She is currently on low-dose gabapentin   HISTORY: She has past history of left breast cancer in 1990s, requiring partial mastectomy and lumpectomy.  She developed clinical stage II uterine carcinosarcoma diagnosed at the time of vaginal hysterectomy and BSO for pelvic prolapse 2010 and underwent adjuvant treatments of therapy withTaxol and carboplatin chemotherapy beginning October 2010, external beam radiation, and then repeat Taxol and carboplatin chemotherapy, finishing 02/27/2010.  She has noted memory problems since October 2010, unable to complete a sentence. She has difficulty with short-term memory. She "cannot remember what she did yesterday" She feels "foggy". She enjoys reading and but has trouble remembering what she was reading,  Her brother has memory loss and Parkinson's disease, She denies visual loss, double vision, swallowing problems, slurred speech, blackout spells,or seizures.  MRI of the brain without contrast 03/31/2010 showed generalized atrophy and small vessel disease. B12 level, RPR,TSH, and Methylmalonic acid were normal.  doppler of the carotids 03/28/2010 was normal.  She  thinks of something she wants to do and cannot remember it in a few minutes. She cannot remember facts. She cannot remember conversations and asks the same questions over and over. She exercises at [employer] 3 times per week.  She has tingling in her hands and fingers that also involves her feet and legs and is worse in her feet. She has mild difficulty walking by herself. This started in October 2010 and has not been painful. She describes it as a "pins and needles feeling".She does have urinary incontinence. She feels like her feet are "going to explode". She describes a tightness like bands around them.  The discomfort in her legs can awaken her at night and she feels that she must take her feet from underneath the bed clothes. Her pain in her feet is 4/10, She has similar symptoms in her hands. She has muscle cramps in her legs at night.  She still has leg muscle cramping, painful feet, numbness tingling at her feet, she denies low back pain, no gait difficulty,  She enjoys reading, newspapers, books, can not remember what she has read, she likes to clean, cook, exercise at [employer], [employer], she walkers 2 miles everyday  She was given aricept in the past, She began to have nightmares, She retired as Tour manager to the institution, She complains of crazy dream, night mares, sleep disturbance.     REVIEW OF SYSTEMS: Full 14 system review of systems performed and notable only for:  Constitutional: N/A  Cardiovascular: N/A  Ear/Nose/Throat: N/A  Skin: N/A  Eyes: N/A  Respiratory: N/A  Gastroitestinal: N/A  Hematology/Lymphatic: N/A  Endocrine: N/A Musculoskeletal:N/A  Allergy/Immunology: N/A  Neurological: Memory loss  Psychiatric: N/A   ALLERGIES: Allergies  Allergen Reactions  . Sulfa Antibiotics Anaphylaxis    HOME MEDICATIONS: Outpatient Prescriptions Prior to Visit  Medication Sig Dispense Refill  . aspirin 81 MG chewable tablet Chew 81 mg by mouth every morning.       Marland Kitchen atorvastatin (LIPITOR) 20 MG tablet Take 10-20 mg by mouth daily. Alternate 10 mg (1/2 tablet) with 20 mg (1 tablet ) every other day      . calcium-vitamin D (OSCAL WITH D) 500-200 MG-UNIT per tablet Take 2 tablets by mouth every morning.      . gabapentin (NEURONTIN) 100 MG capsule Take 1 capsule (100 mg total) by mouth 3 (three) times daily.  270 capsule  1  . Multiple Vitamin (MULTIVITAMIN WITH MINERALS) TABS Take 1 tablet by mouth daily.      . propranolol ER (INDERAL LA) 120 MG 24 hr capsule Take 120 mg by mouth every morning.       . simethicone (GAS-X) 80 MG chewable tablet Chew 1 tablet (80 mg total) by mouth every 6 (six) hours as needed for flatulence.  30 tablet  0  . polyethylene glycol (MIRALAX / GLYCOLAX) packet Take 17 g by mouth daily.  14 each  0  . Memantine HCl ER (NAMENDA XR) 28 MG CP24 Take 28 mg by mouth daily.  30 capsule  12  . mirabegron ER (MYRBETRIQ) 25 MG TB24 Take 25 mg by mouth every Monday, Wednesday, and Friday.       . polyethylene glycol (MIRALAX / GLYCOLAX) packet Take 17 g by mouth daily as needed. For constipation       No facility-administered medications prior to visit.    PAST MEDICAL HISTORY: Past Medical History  Diagnosis Date  . Hypertension   . Overactive bladder   . High blood pressure   . Cancer     carcinoma sarcoma, left breast   . High cholesterol   . Ileus   . Abdominal adhesions     PAST SURGICAL HISTORY: Past Surgical History  Procedure Laterality Date  . Abdominal hysterectomy    . Breast biopsy    . Ankle surgery    . Hernia repair      as a child    FAMILY HISTORY: Family History  Problem Relation Age of Onset  . Cancer Father     pancreatic     SOCIAL HISTORY: History   Social History  . Marital Status: Married    Spouse Name: N/A    Number of Children: N/A  . Years of Education: N/A   Occupational History  . Not on file.   Social History Main Topics  . Smoking status: Never Smoker   .  Smokeless tobacco: Never Used  . Alcohol Use: No  . Drug Use: No  . Sexual Activity: No     Comment: married   Other Topics Concern  . Not on file   Social History Narrative  . No narrative on file     PHYSICAL EXAM  Filed Vitals:   09/08/13 1059  BP: 120/72  Pulse: 59  Height: 5\' 2"  (1.575 m)  Weight: 122 lb (55.339 kg)   Body mass index is 22.31 kg/(m^2).  Generalized: Well developed, in no acute distress  Head: normocephalic and atraumatic,. Oropharynx benign  Neck: Supple, no carotid bruits  Cardiac: Regular rate rhythm, no murmur  Musculoskeletal: No deformity   Neurological examination   Mentation: Alert oriented to time, place, history taking. MMSE 28/30. AFT  17. Follows all commands speech and language fluent  Cranial nerve II-XII: Pupils were equal round reactive to light extraocular movements were full, visual field were full on confrontational test. Bilateral ptosis right greater than left . Facial sensation and strength were normal. hearing was intact to finger rubbing bilaterally. Uvula tongue midline. head turning and shoulder shrug and were normal and symmetric.Tongue protrusion into cheek strength was normal. Motor: normal bulk and tone, full strength in the BUE, BLE, fine finger movements normal, no pronator drift. No focal weakness, bilateral hammertoes Sensory: normal to  light touch, pinprick,  decreased joint position and vibratory lower extremities   Coordination: finger-nose-finger, heel-to-shin bilaterally, no dysmetria Reflexes: Brachioradialis 2/2, biceps 2/2, triceps 2/2, patellar 2/2, Achilles 2/2, plantar responses were flexor bilaterally. Gait and Station: Rising up from seated position without assistance, normal stance,  moderate stride, good arm swing, smooth turning, un able to perform tiptoe, and heel walking,mildly unsteady with tandem.   DIAGNOSTIC DATA (LABS, IMAGING, TESTING) - I reviewed patient records, labs, notes, testing and  imaging myself where available.  Lab Results  Component Value Date   WBC 5.5 06/26/2013   HGB 10.9* 06/26/2013   HCT 33.2* 06/26/2013   MCV 99.7 06/26/2013   PLT 223 06/26/2013      Component Value Date/Time   NA 145 06/26/2013 0545   K 4.0 06/26/2013 0545   CL 114* 06/26/2013 0545   CO2 27 06/26/2013 0545   GLUCOSE 110* 06/26/2013 0545   BUN 8 06/26/2013 0545   CREATININE 0.90 06/26/2013 0545   CALCIUM 8.6 06/26/2013 0545   PROT 5.0* 06/26/2013 0545   ALBUMIN 2.7* 06/26/2013 0545   AST 16 06/26/2013 0545   ALT 13 06/26/2013 0545   ALKPHOS 52 06/26/2013 0545   BILITOT 0.6 06/26/2013 0545   GFRNONAA 61* 06/26/2013 0545   GFRAA 70* 06/26/2013 0545      Lab Results  Component Value Date   TSH 2.328 11/22/2012     ASSESSMENT AND PLAN  78 y.o. year old female  has a past medical history of gradual onset memory problems is consistent with cognitive impairment. Mental status exam 28/30, animal fluency 17. Differential diagnosis includes age-related chemotherapy radiation induced versus early central nervous system degenerative disorder. She has had side effects to Aricept and Namenda in the past. She also has a pins and needles sensation in her feet and lower extremities. She is currently on gabapentin low-dose  Continue moderate exercise Continue  ASA  Increase Gabapentin to 200mg  TID, RX faxed  Will f/u 6 months  Nilda Riggs, [facility], [facility], APRN  [facility] [address], [address] [place], [place] [address] [phone]

## 2013-09-08 NOTE — Patient Instructions (Signed)
Continue moderate exercise Continue  ASA  Will f/u 6 months

## 2013-09-17 ENCOUNTER — Other Ambulatory Visit: Payer: Self-pay

## 2013-10-09 ENCOUNTER — Other Ambulatory Visit: Payer: Self-pay

## 2013-10-09 DIAGNOSIS — Z1231 Encounter for screening mammogram for malignant neoplasm of breast: Secondary | ICD-10-CM

## 2013-11-06 ENCOUNTER — Encounter (HOSPITAL_COMMUNITY): Payer: Self-pay | Admitting: Emergency Medicine

## 2013-11-06 ENCOUNTER — Emergency Department (HOSPITAL_COMMUNITY)
Admission: EM | Admit: 2013-11-06 | Discharge: 2013-11-07 | Disposition: A | Discharge status: Home / Self Care | Payer: Medicare Other | Attending: Emergency Medicine | Admitting: Emergency Medicine

## 2013-11-06 DIAGNOSIS — K529 Noninfective gastroenteritis and colitis, unspecified: Secondary | ICD-10-CM

## 2013-11-06 DIAGNOSIS — K5289 Other specified noninfective gastroenteritis and colitis: Secondary | ICD-10-CM | POA: Insufficient documentation

## 2013-11-06 DIAGNOSIS — Z7982 Long term (current) use of aspirin: Secondary | ICD-10-CM | POA: Insufficient documentation

## 2013-11-06 DIAGNOSIS — Z79899 Other long term (current) drug therapy: Secondary | ICD-10-CM | POA: Insufficient documentation

## 2013-11-06 DIAGNOSIS — Z8542 Personal history of malignant neoplasm of other parts of uterus: Secondary | ICD-10-CM | POA: Insufficient documentation

## 2013-11-06 DIAGNOSIS — Z87448 Personal history of other diseases of urinary system: Secondary | ICD-10-CM | POA: Insufficient documentation

## 2013-11-06 DIAGNOSIS — I10 Essential (primary) hypertension: Secondary | ICD-10-CM | POA: Insufficient documentation

## 2013-11-06 DIAGNOSIS — E7800 Pure hypercholesterolemia, unspecified: Secondary | ICD-10-CM | POA: Insufficient documentation

## 2013-11-06 DIAGNOSIS — Z923 Personal history of irradiation: Secondary | ICD-10-CM | POA: Insufficient documentation

## 2013-11-06 DIAGNOSIS — Z853 Personal history of malignant neoplasm of breast: Secondary | ICD-10-CM | POA: Insufficient documentation

## 2013-11-06 DIAGNOSIS — Z9889 Other specified postprocedural states: Secondary | ICD-10-CM | POA: Insufficient documentation

## 2013-11-06 LAB — URINALYSIS, ROUTINE W REFLEX MICROSCOPIC
Glucose, UA: NEGATIVE mg/dL
Hgb urine dipstick: NEGATIVE
Nitrite: NEGATIVE
Protein, ur: NEGATIVE mg/dL
Urobilinogen, UA: 0.2 mg/dL (ref 0.0–1.0)
pH: 5.5 (ref 5.0–8.0)

## 2013-11-06 LAB — COMPREHENSIVE METABOLIC PANEL
ALT: 16 U/L (ref 0–35)
AST: 23 U/L (ref 0–37)
Albumin: 3.9 g/dL (ref 3.5–5.2)
BUN: 21 mg/dL (ref 6–23)
Chloride: 100 mEq/L (ref 96–112)
Creatinine, Ser: 1.01 mg/dL (ref 0.50–1.10)
Potassium: 3.9 mEq/L (ref 3.5–5.1)
Sodium: 137 mEq/L (ref 135–145)
Total Bilirubin: 0.8 mg/dL (ref 0.3–1.2)
Total Protein: 6.9 g/dL (ref 6.0–8.3)

## 2013-11-06 LAB — CBC WITH DIFFERENTIAL/PLATELET
Basophils Absolute: 0 10*3/uL (ref 0.0–0.1)
Basophils Relative: 0 % (ref 0–1)
Eosinophils Absolute: 0 10*3/uL (ref 0.0–0.7)
HCT: 39 % (ref 36.0–46.0)
Lymphocytes Relative: 11 % — ABNORMAL LOW (ref 12–46)
MCH: 32.5 pg (ref 26.0–34.0)
MCHC: 33.3 g/dL (ref 30.0–36.0)
Monocytes Absolute: 0.7 10*3/uL (ref 0.1–1.0)
Neutro Abs: 8.3 10*3/uL — ABNORMAL HIGH (ref 1.7–7.7)
Neutrophils Relative %: 82 % — ABNORMAL HIGH (ref 43–77)
Platelets: 271 10*3/uL (ref 150–400)
RDW: 13.1 % (ref 11.5–15.5)

## 2013-11-06 LAB — URINE MICROSCOPIC-ADD ON

## 2013-11-06 MED ORDER — ONDANSETRON 8 MG PO TBDP
8.0000 mg | ORAL_TABLET | Freq: Once | ORAL | Status: AC
  Administered 2013-11-06: 8 mg via ORAL
  Filled 2013-11-06: qty 2

## 2013-11-06 NOTE — ED Notes (Addendum)
Pt reports 6/10 generalized lower abdominal pain that began yesterday with nausea and emesis. Pt was seen at the [facility] in clinic and was sent here to rule out a small bowel obstruction due to a history of 3 intestinal blockages. Pt reports the nausea and emesis is worse today than yesterday. Pt is A/O x4 and vital signs are WDL.

## 2013-11-07 ENCOUNTER — Emergency Department (HOSPITAL_COMMUNITY): Payer: Medicare Other

## 2013-11-07 MED ORDER — CIPROFLOXACIN HCL 500 MG PO TABS
500.0000 mg | ORAL_TABLET | Freq: Once | ORAL | Status: AC
  Administered 2013-11-07: 500 mg via ORAL
  Filled 2013-11-07: qty 1

## 2013-11-07 MED ORDER — METRONIDAZOLE 500 MG PO TABS: 500.0000 mg | ORAL_TABLET | Freq: Two times a day (BID) | ORAL | Status: DC

## 2013-11-07 MED ORDER — IOHEXOL 300 MG/ML  SOLN
100.0000 mL | Freq: Once | INTRAMUSCULAR | Status: AC | PRN
  Administered 2013-11-07: 100 mL via INTRAVENOUS

## 2013-11-07 MED ORDER — ONDANSETRON HCL 4 MG/2ML IJ SOLN
4.0000 mg | Freq: Once | INTRAMUSCULAR | Status: DC
  Filled 2013-11-07: qty 2

## 2013-11-07 MED ORDER — CIPROFLOXACIN HCL 500 MG PO TABS: 500.0000 mg | ORAL_TABLET | Freq: Two times a day (BID) | ORAL | Status: DC

## 2013-11-07 MED ORDER — SODIUM CHLORIDE 0.9 % IV SOLN: INTRAVENOUS | Status: DC

## 2013-11-07 MED ORDER — METRONIDAZOLE 500 MG PO TABS
500.0000 mg | ORAL_TABLET | Freq: Once | ORAL | Status: AC
  Administered 2013-11-07: 500 mg via ORAL
  Filled 2013-11-07: qty 1

## 2013-11-07 MED ORDER — IOHEXOL 300 MG/ML  SOLN
50.0000 mL | Freq: Once | INTRAMUSCULAR | Status: AC | PRN
  Administered 2013-11-07: 50 mL via ORAL

## 2013-11-07 MED ORDER — SODIUM CHLORIDE 0.9 % IJ SOLN
INTRAMUSCULAR | Status: AC
  Administered 2013-11-07: 10 mL
  Filled 2013-11-07: qty 10

## 2013-11-07 MED ORDER — PROMETHAZINE HCL 25 MG PO TABS: 25.0000 mg | ORAL_TABLET | Freq: Four times a day (QID) | ORAL | Status: DC | PRN

## 2013-11-07 NOTE — ED Notes (Signed)
Bed: WA03 Expected date:  Expected time:  Means of arrival:  Comments: 

## 2013-11-07 NOTE — ED Notes (Signed)
Unable to gain port access from pt. No blood return noted. IV access to be obtained. Sodium chloride used for flushing port.

## 2013-11-07 NOTE — ED Provider Notes (Signed)
CSN: [id number]     Arrival date & time 11/06/13  2013 History   First MD Initiated Contact with Patient 11/07/13 0310     Chief Complaint  Patient presents with  . Abdominal Pain  . Emesis   (Consider location/radiation/quality/duration/timing/severity/associated sxs/prior Treatment) HPI History provided by patient. History of uterine cancer with radiation treatment. Has had small bowel obstruction in the past. Tonight at home developed abdominal pain across her abdomen, sharp in quality and moderate in severity with associated nausea vomiting. She was evaluated at a walk-in clinic and referred to the emergency room. Last bowel movement today. No fevers or chills. No back pain or radiation of pain. No dysuria. Patient states her symptoms feel like previous small bowel obstruction. Past Medical History  Diagnosis Date  . Hypertension   . Overactive bladder   . High blood pressure   . Cancer     carcinoma sarcoma, left breast   . High cholesterol   . Ileus   . Abdominal adhesions    Past Surgical History  Procedure Laterality Date  . Abdominal hysterectomy    . Breast biopsy    . Ankle surgery    . Hernia repair      as a child   Family History  Problem Relation Age of Onset  . Cancer Father     pancreatic    History  Substance Use Topics  . Smoking status: Never Smoker   . Smokeless tobacco: Never Used  . Alcohol Use: No   OB History   Grav Para Term Preterm Abortions TAB SAB Ect Mult Living                 Review of Systems  Constitutional: Negative for fever and chills.  Respiratory: Negative for shortness of breath.   Cardiovascular: Negative for chest pain.  Gastrointestinal: Positive for nausea, vomiting and abdominal pain. Negative for blood in stool.  Genitourinary: Negative for dysuria and flank pain.  Musculoskeletal: Negative for back pain, neck pain and neck stiffness.  Skin: Negative for rash.  Neurological: Negative for headaches.  All other  systems reviewed and are negative.    Allergies  Sulfa antibiotics  Home Medications   Current Outpatient Rx  Name  Route  Sig  Dispense  Refill  . aspirin 81 MG chewable tablet   Oral   Chew 81 mg by mouth every morning.         Marland Kitchen atorvastatin (LIPITOR) 20 MG tablet   Oral   Take 10-20 mg by mouth daily. Alternate 10 mg (1/2 tablet) with 20 mg (1 tablet ) every other day         . calcium-vitamin D (OSCAL WITH D) 500-200 MG-UNIT per tablet   Oral   Take 2 tablets by mouth every morning.         . gabapentin (NEURONTIN) 100 MG capsule   Oral   Take 2 capsules (200 mg total) by mouth 3 (three) times daily.   180 capsule   6   . Multiple Vitamin (MULTIVITAMIN WITH MINERALS) TABS   Oral   Take 1 tablet by mouth daily.         . polyethylene glycol (MIRALAX / GLYCOLAX) packet   Oral   Take 17 g by mouth daily. Take 1/2 tbs         . propranolol ER (INDERAL LA) 120 MG 24 hr capsule   Oral   Take 120 mg by mouth every morning.          Marland Kitchen  simethicone (GAS-X) 80 MG chewable tablet   Oral   Chew 1 tablet (80 mg total) by mouth every 6 (six) hours as needed for flatulence.   30 tablet   0    BP 107/64  Pulse 80  Temp(Src) 98.3 F (36.8 C) (Oral)  Resp 18  SpO2 100% Physical Exam  Constitutional: She is oriented to person, place, and time. She appears well-developed and well-nourished.  HENT:  Head: Normocephalic and atraumatic.  Eyes: EOM are normal. Pupils are equal, round, and reactive to light. No scleral icterus.  Neck: Neck supple.  Cardiovascular: Regular rhythm and intact distal pulses.   Pulmonary/Chest: Effort normal. No respiratory distress.  Abdominal: Soft. She exhibits no mass. There is no rebound and no guarding.  Mild diffuse tenderness with decreased bowel sounds  Musculoskeletal: Normal range of motion. She exhibits no edema.  Neurological: She is alert and oriented to person, place, and time.  Skin: Skin is warm and dry.    ED  Course  Procedures (including critical care time) Labs Review Labs Reviewed  CBC WITH DIFFERENTIAL - Abnormal; Notable for the following:    Neutrophils Relative % 82 (*)    Neutro Abs 8.3 (*)    Lymphocytes Relative 11 (*)    All other components within normal limits  COMPREHENSIVE METABOLIC PANEL - Abnormal; Notable for the following:    Glucose, Bld 153 (*)    GFR calc non Af Amer 53 (*)    GFR calc Af Amer 61 (*)    All other components within normal limits  URINALYSIS, ROUTINE W REFLEX MICROSCOPIC - Abnormal; Notable for the following:    Color, Urine AMBER (*)    APPearance CLOUDY (*)    Specific Gravity, Urine 1.031 (*)    Bilirubin Urine SMALL (*)    Ketones, ur 40 (*)    Leukocytes, UA TRACE (*)    All other components within normal limits  URINE MICROSCOPIC-ADD ON   Imaging Review Ct Abdomen Pelvis W Contrast  11/07/2013   CLINICAL DATA:  Lower abdominal pain, nausea and vomiting.  EXAM: CT ABDOMEN AND PELVIS WITH CONTRAST  TECHNIQUE: Multidetector CT imaging of the abdomen and pelvis was performed using the standard protocol following bolus administration of intravenous contrast.  CONTRAST:  50mL OMNIPAQUE IOHEXOL 300 MG/ML SOLN, OMNIPAQUE IOHEXOL 300 MG/ML SOLN  COMPARISON:  CT of the abdomen and pelvis performed 06/25/2013 the  FINDINGS: Minimal bibasilar atelectasis is noted.  Tiny hypodensities within the liver are nonspecific but may reflect small cysts. The liver is otherwise unremarkable in appearance. The spleen is within normal limits. Trace ascites is noted about the liver. The pancreas and adrenal glands are unremarkable in appearance. The gallbladder is unremarkable.  The kidneys are unremarkable in appearance. There is no evidence of hydronephrosis. No renal or ureteral stones are seen. No perinephric stranding is appreciated.  There is mild apparent wall thickening along the distal ileum within the pelvis, which may reflect a mild infectious or inflammatory  process. Trace associated fluid is noted within the pelvis. The more proximal small bowel is unremarkable in appearance. There is no evidence of bowel dilatation to suggest obstruction. The stomach is within normal limits. No acute vascular abnormalities are seen.  The appendix is not definitely seen. There is no evidence for appendicitis. Contrast progresses to the ascending colon; the colon is unremarkable in appearance.  The bladder is mildly distended and grossly unremarkable. The patient is status post hysterectomy. No suspicious adnexal masses are  seen. No inguinal lymphadenopathy is seen.  No acute osseous abnormalities are identified. There is grade 1 anterolisthesis of L4 on L5 and of L5 on S1, reflecting underlying facet disease.  IMPRESSION: 1. Mild apparent wall thickening along the distal ileum within the pelvis, which may reflect a mild infectious or inflammatory process. Trace associated free fluid noted in the pelvis. The more proximal small bowel is unremarkable. No evidence of bowel obstruction. 2. Trace ascites noted about the liver. 3. Possible tiny cysts within the liver. 4. Mild degenerative change at the lower lumbar spine.   Electronically Signed   By: Roanna Raider M.D.   On: 11/07/2013 05:20    EKG Interpretation   None      IV fluids. IV Zofran. Patient declined pain medication at this time.  5:36 AM feeling better on recheck no sig ABD tenderness.   CT reviewed as above. PO ABx provided with strict return precautions. Plan close outpatient follow up.  PT is reliable and agrees to plan. Rx phenergan, cipro and flagyl  MDM  Dx: Ileitis  Labs and CT obtained/ reviewed Condition improved with IVFs and zofran, serial ABD exams performed VS and nurses notes reviewed and considered    Sunnie Nielsen, MD 11/07/13 2303

## 2013-11-13 ENCOUNTER — Ambulatory Visit
Admission: RE | Admit: 2013-11-13 | Discharge: 2013-11-13 | Disposition: A | Discharge status: Home / Self Care | Payer: Medicare HMO | Source: Ambulatory Visit | Attending: Nurse Practitioner | Admitting: Nurse Practitioner

## 2013-11-13 ENCOUNTER — Other Ambulatory Visit: Payer: Self-pay | Admitting: Nurse Practitioner

## 2013-11-13 DIAGNOSIS — K529 Noninfective gastroenteritis and colitis, unspecified: Secondary | ICD-10-CM

## 2013-11-17 ENCOUNTER — Ambulatory Visit
Admission: RE | Admit: 2013-11-17 | Discharge: 2013-11-17 | Disposition: A | Discharge status: Home / Self Care | Payer: Medicare HMO | Source: Ambulatory Visit

## 2013-11-17 DIAGNOSIS — Z1231 Encounter for screening mammogram for malignant neoplasm of breast: Secondary | ICD-10-CM

## 2014-02-01 IMAGING — CR DG CHEST 2V
2 series · 2 of 2 positions shown · non-contrast
Comparison: CT 03/28/2012

CLINICAL DATA: Endometrial cancer

EXAM:
CHEST  2 VIEW

[w chest pa]
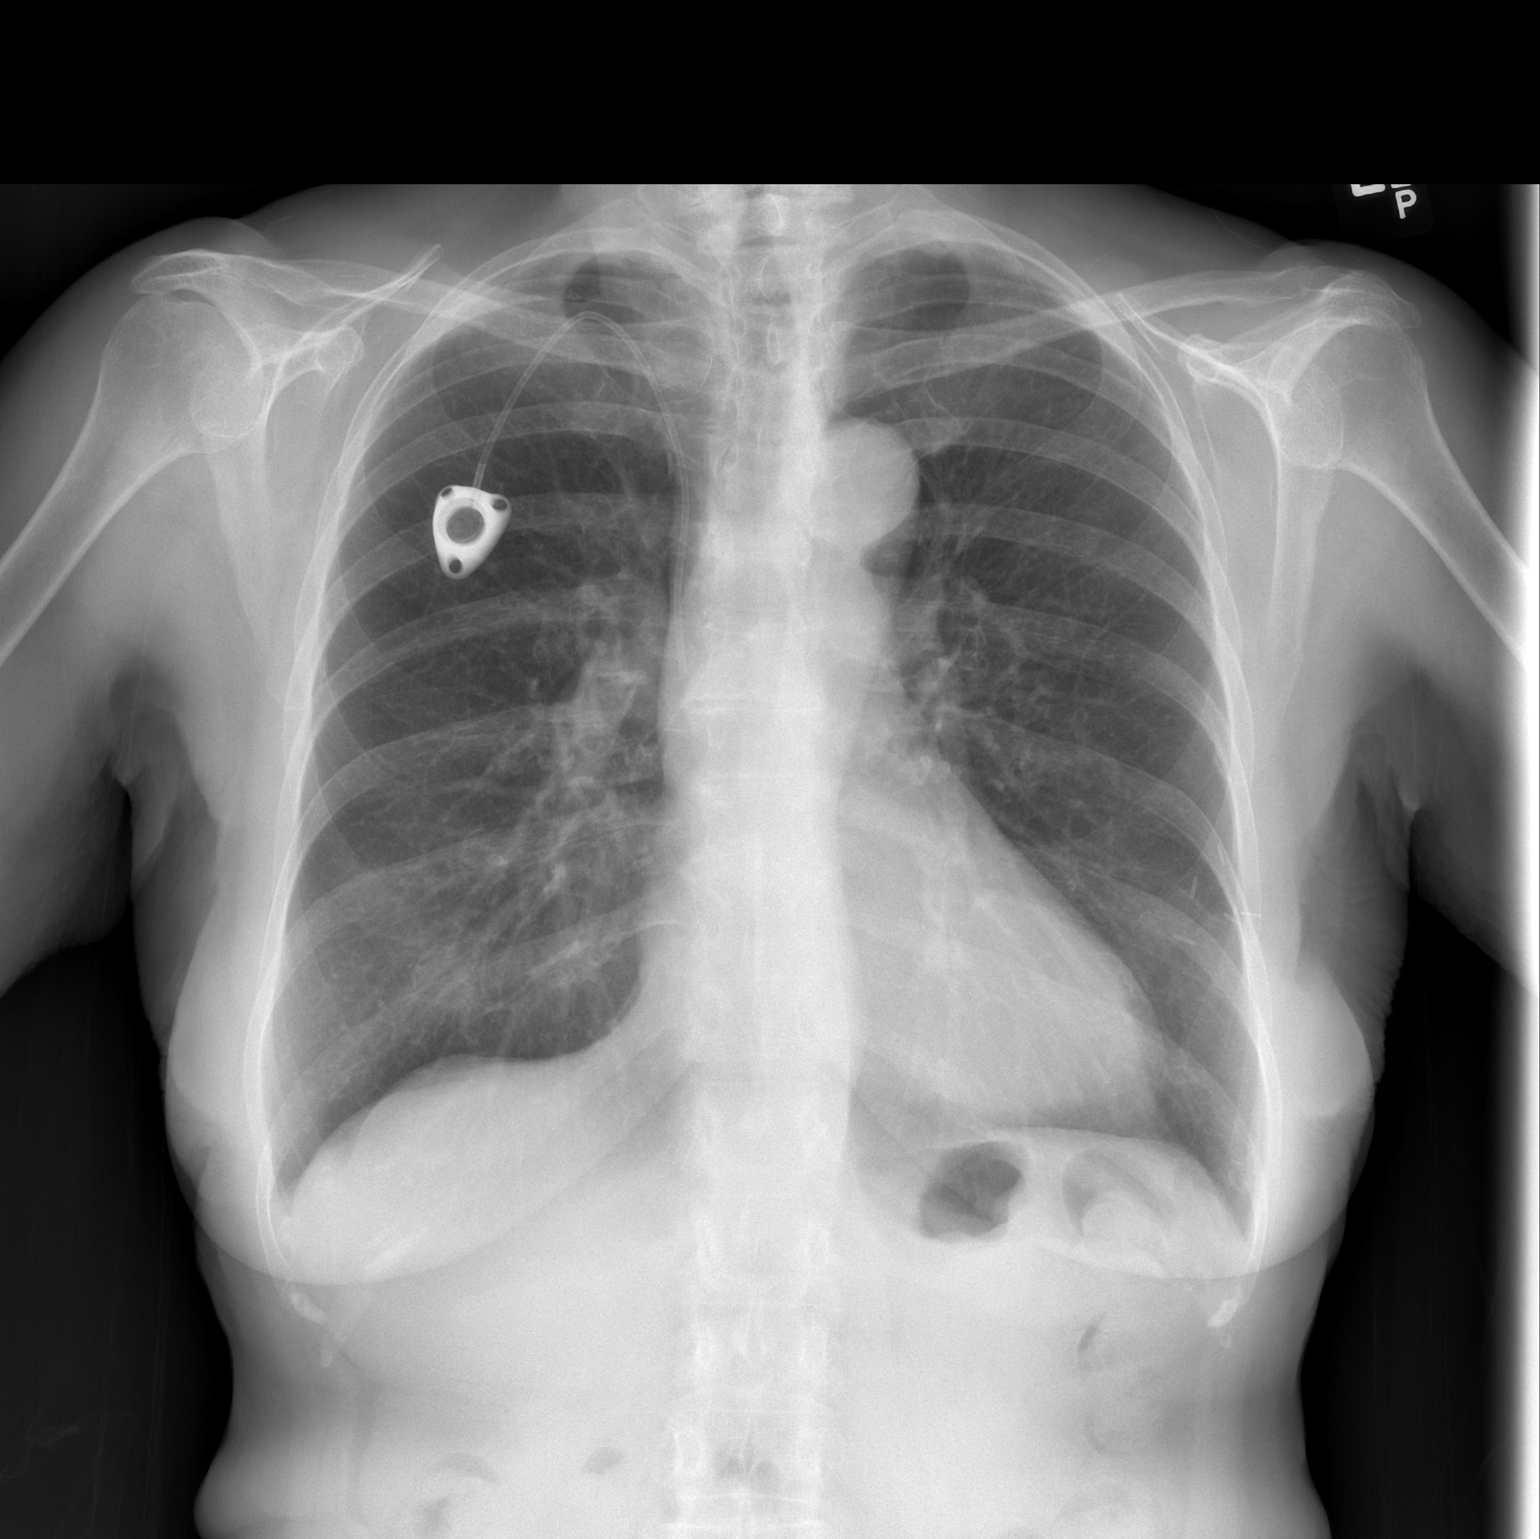

[w chest lat]
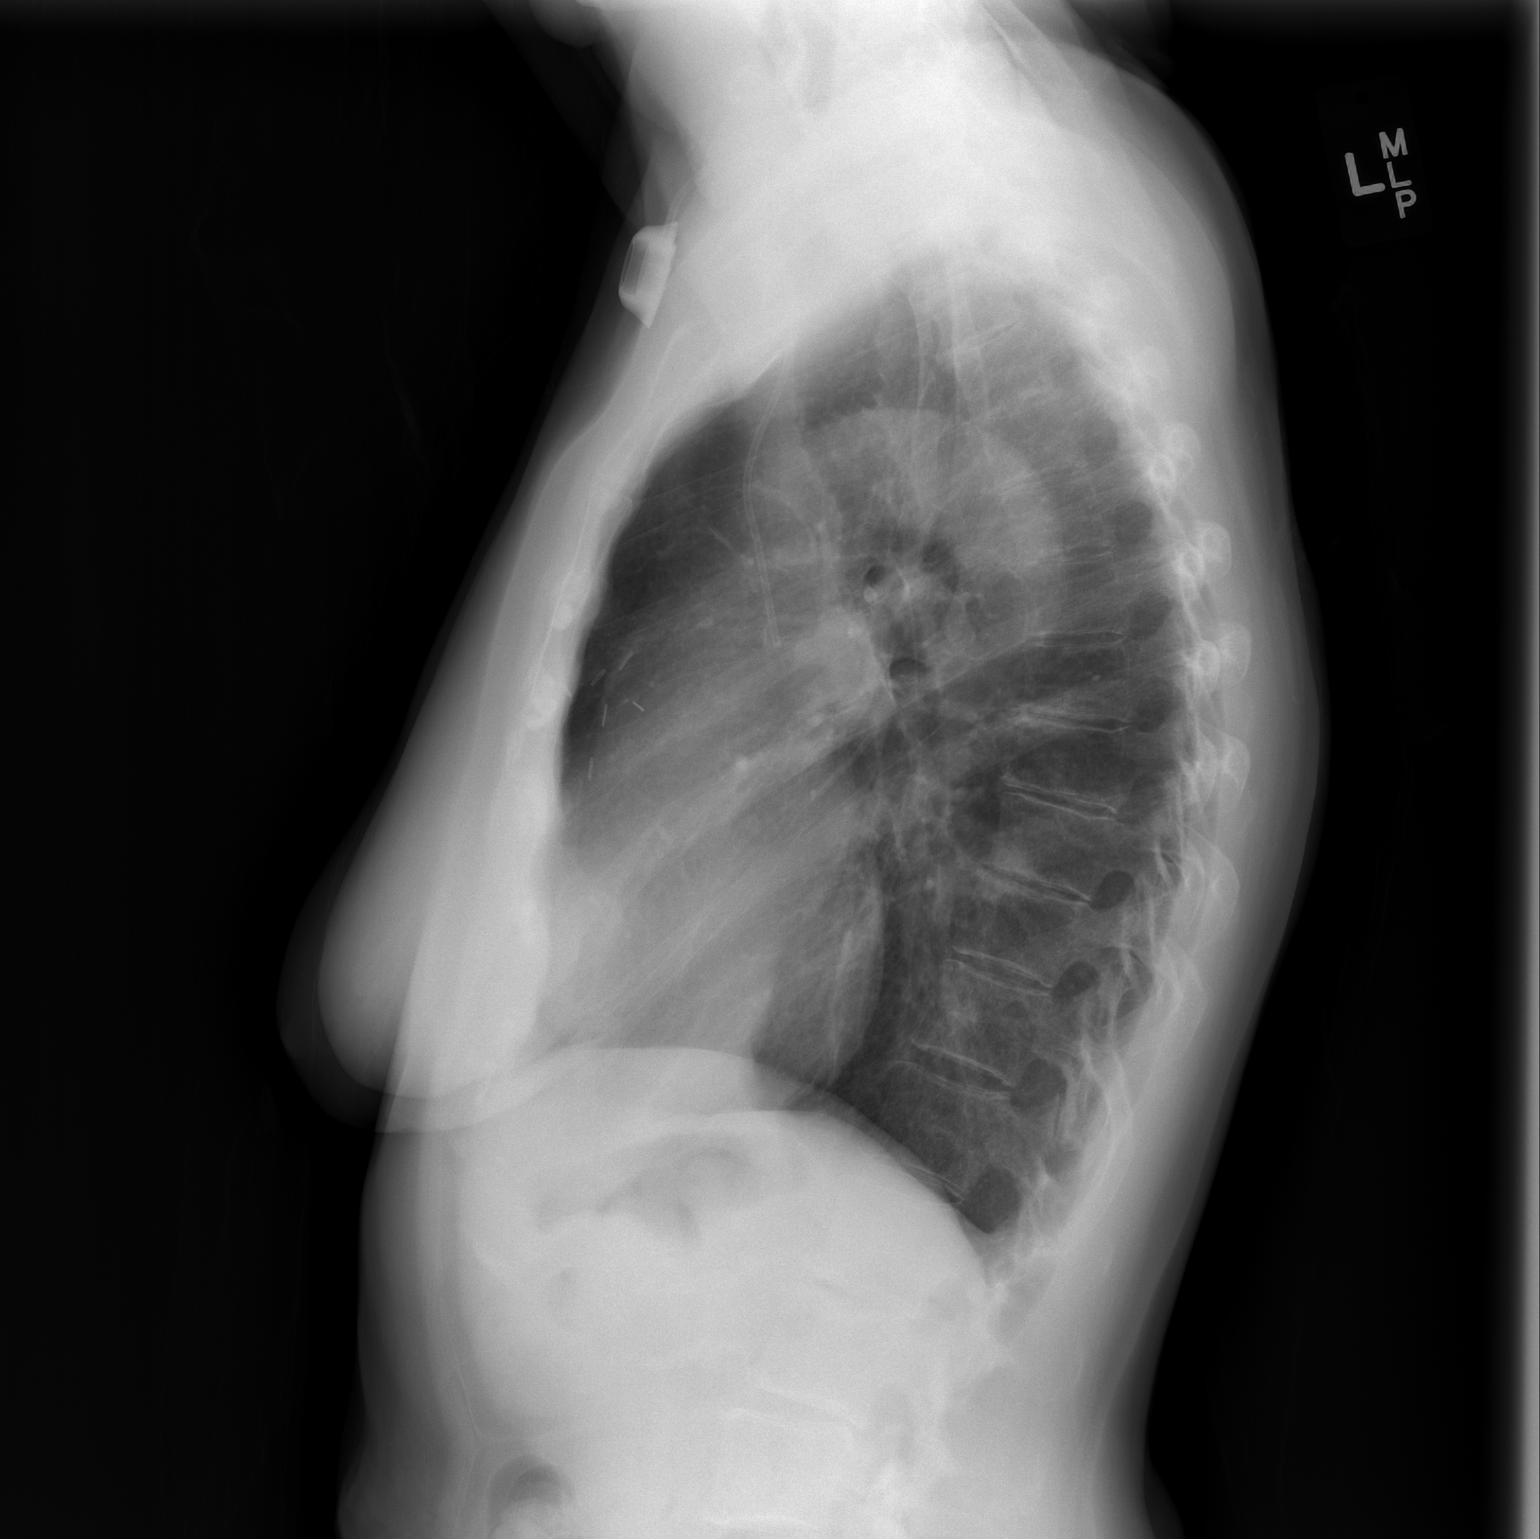

[2 of 2 positions shown; findings below may reference images not displayed]

FINDINGS: Port-A-Cath in the SVC. Heart size upper normal. Vascularity is
normal. Lungs are free of infiltrate or effusion. No lung mass or
nodule.

Surgical clips in the left breast.
IMPRESSION: No active cardiopulmonary disease. Negative for metastatic disease.

## 2014-03-09 ENCOUNTER — Ambulatory Visit (INDEPENDENT_AMBULATORY_CARE_PROVIDER_SITE_OTHER): Payer: Commercial Managed Care - HMO | Admitting: Nurse Practitioner

## 2014-03-09 ENCOUNTER — Encounter: Payer: Self-pay | Admitting: Nurse Practitioner

## 2014-03-09 VITALS — BP 124/74 | HR 56 | Ht 61.0 in | Wt 125.0 lb

## 2014-03-09 DIAGNOSIS — R413 Other amnesia: Secondary | ICD-10-CM

## 2014-03-09 DIAGNOSIS — R209 Unspecified disturbances of skin sensation: Secondary | ICD-10-CM | POA: Insufficient documentation

## 2014-03-09 MED ORDER — GABAPENTIN 100 MG PO CAPS: 200.0000 mg | ORAL_CAPSULE | Freq: Three times a day (TID) | ORAL | Status: DC

## 2014-03-09 NOTE — Patient Instructions (Signed)
Memory score is stable Continue baby aspirin daily Continue low-dose gabapentin will renew Continue exercise program Continue gabapentin as directed Followup in 6-8 months

## 2014-03-09 NOTE — Progress Notes (Signed)
[facility]  PATIENT: Laura Perkins DOB: 09-Dec-1934   REASON FOR VISIT: Followup for memory loss    HISTORY OF PRESENT ILLNESS: Laura Perkins, 79 year old female returns for followup. She was last seen in this office 09/08/2013. MRI of the brain in 2011 showed generalized atrophy and small vessel disease. 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 She has had side effects to Aricept in the past with nightmares. She had dizziness on Namenda. She is currently taking a [facility] She has no new neurologic complaints  HISTORY: She has past history of left breast cancer in 1990s, requiring partial mastectomy and lumpectomy.  She developed clinical stage II uterine carcinosarcoma diagnosed at the time of vaginal hysterectomy and BSO for pelvic prolapse 2010 and underwent adjuvant treatments of therapy withTaxol and carboplatin chemotherapy beginning October 2010, external beam radiation, and then repeat Taxol and carboplatin chemotherapy, finishing 02/27/2010.  She has noted memory problems since October 2010, unable to complete a sentence. She has difficulty with short-term memory. She "cannot remember what she did yesterday" She feels "foggy". She enjoys reading and but has trouble remembering what she was reading,  Her brother has memory loss and Parkinson's disease, She denies visual loss, double vision, swallowing problems, slurred speech, blackout spells,or seizures.  MRI of the brain without contrast 03/31/2010 showed generalized atrophy and small vessel disease. B12 level, RPR,TSH, and Methylmalonic acid were normal.  doppler of the carotids 03/28/2010 was normal.  She thinks of something she wants to do and cannot remember it in a few minutes. She cannot remember facts. She cannot remember conversations and asks the same questions over and over. She exercises at [employer] 3 times per week.  She has tingling in her hands  and fingers that also involves her feet and legs and is worse in her feet. She has mild difficulty walking by herself. This started in October 2010 and has not been painful. She describes it as a "pins and needles feeling".She does have urinary incontinence. She feels like her feet are "going to explode". She describes a tightness like bands around them.  The discomfort in her legs can awaken her at night and she feels that she must take her feet from underneath the bed clothes. Her pain in her feet is 4/10, She has similar symptoms in her hands. She has muscle cramps in her legs at night.  She still has leg muscle cramping, painful feet, numbness tingling at her feet, she denies low back pain, no gait difficulty,  She enjoys reading, newspapers, books, can not remember what she has read, she likes to clean, cook, [facility], silver sneaker, she walkers 2 miles everyday  She was given aricept in the past, She began to have nightmares, She retired as Health visitor to the institution, She complains of crazy dream, night mares, sleep disturbance.     REVIEW OF SYSTEMS: Full 14 system review of systems performed and notable only for those listed, all others are neg:  Constitutional: N/A  Cardiovascular: N/A  Ear/Nose/Throat: N/A  Skin: N/A  Eyes: N/A  Respiratory: N/A  Gastroitestinal: N/A  Hematology/Lymphatic: N/A  Endocrine: N/A Musculoskeletal:N/A  Allergy/Immunology: N/A  Neurological: N/A Psychiatric: N/A   ALLERGIES: Allergies  Allergen Reactions  . Sulfa Antibiotics Anaphylaxis    HOME MEDICATIONS: Outpatient Prescriptions Prior to Visit  Medication Sig Dispense Refill  . aspirin 81 MG chewable tablet Chew 81 mg by mouth every morning.      Marland Kitchen  atorvastatin (LIPITOR) 20 MG tablet Take 10-20 mg by mouth daily. Alternate 10 mg (1/2 tablet) with 20 mg (1 tablet ) every other day      . calcium-vitamin D (OSCAL WITH D) 500-200 MG-UNIT per tablet Take 2 tablets by mouth every  morning.      . carboxymethylcellulose (REFRESH PLUS) 0.5 % SOLN Place 1 drop into both eyes 3 (three) times daily as needed (for irritated eyes).      . gabapentin (NEURONTIN) 100 MG capsule Take 2 capsules (200 mg total) by mouth 3 (three) times daily.  180 capsule  6  . ibuprofen (ADVIL,MOTRIN) 200 MG tablet Take 400 mg by mouth every 6 (six) hours as needed for mild pain.      . Multiple Vitamin (MULTIVITAMIN WITH MINERALS) TABS Take 1 tablet by mouth daily.      . polyethylene glycol (MIRALAX / GLYCOLAX) packet Take 17 g by mouth daily. Take 1/2 tbs      . propranolol ER (INDERAL LA) 120 MG 24 hr capsule Take 120 mg by mouth every morning.       . simethicone (GAS-X) 80 MG chewable tablet Chew 1 tablet (80 mg total) by mouth every 6 (six) hours as needed for flatulence.  30 tablet  0  . ciprofloxacin (CIPRO) 500 MG tablet Take 1 tablet (500 mg total) by mouth 2 (two) times daily.  14 tablet  0  . metroNIDAZOLE (FLAGYL) 500 MG tablet Take 1 tablet (500 mg total) by mouth 2 (two) times daily.  14 tablet  0  . mirabegron ER (MYRBETRIQ) 25 MG TB24 tablet Take 25 mg by mouth daily.      . promethazine (PHENERGAN) 25 MG tablet Take 1 tablet (25 mg total) by mouth every 6 (six) hours as needed for nausea or vomiting.  30 tablet  0   No facility-administered medications prior to visit.    PAST MEDICAL HISTORY: Past Medical History  Diagnosis Date  . Hypertension   . Overactive bladder   . High blood pressure   . Cancer     carcinoma sarcoma, left breast   . High cholesterol   . Ileus   . Abdominal adhesions     PAST SURGICAL HISTORY: Past Surgical History  Procedure Laterality Date  . Abdominal hysterectomy    . Breast biopsy    . Ankle surgery    . Hernia repair      as a child    FAMILY HISTORY: Family History  Problem Relation Age of Onset  . Cancer Father     pancreatic     SOCIAL HISTORY: History   Social History  . Marital Status: Married    Spouse Name: Laura Perkins      Number of Children: 1  . Years of Education: HS    Occupational History  . retired     Social History Main Topics  . Smoking status: Never Smoker   . Smokeless tobacco: Never Used  . Alcohol Use: No  . Drug Use: No  . Sexual Activity: No     Comment: married   Other Topics Concern  . Not on file   Social History Narrative   Patient lives at home with home Laura Perkins her husband.    Patient has 1 child.    Patient is retired.    Patient has a high school education.    Patient is right handed     PHYSICAL EXAM  Filed Vitals:   03/09/14 1359  BP: 124/74  Pulse: 56  Height: 5\' 1"  (1.549 m)  Weight: 125 lb (56.7 kg)   Body mass index is 23.63 kg/(m^2).  Generalized: Well developed, in no acute distress  Head: normocephalic and atraumatic,. Oropharynx benign  Neck: Supple, no carotid bruits  Cardiac: Regular rate rhythm, no murmur  Musculoskeletal: No deformity   Neurological examination   Mentation: Alert oriented to time, place, history taking. MMSE 29/30 missing one item in recall. AFT 15. Follows all commands speech and language fluent  Cranial nerve II-XII: Pupils were equal round reactive to light extraocular movements were full, visual field were full on confrontational test. Facial sensation and strength were normal. hearing was intact to finger rubbing bilaterally. Uvula tongue midline. head turning and shoulder shrug were normal and symmetric.Tongue protrusion into cheek strength was normal. Motor: normal bulk and tone, full strength in the BUE, BLE, fine finger movements normal, no pronator drift. No focal weakness, bilateral hammertoes  Sensory: normal and symmetric to light touch, pinprick,  decreased to  vibration  in lower extremities  Coordination: finger-nose-finger, heel-to-shin bilaterally, no dysmetria Reflexes: Brachioradialis 2/2, biceps 2/2, triceps 2/2, patellar 2/2, Achilles 2/2, plantar responses were flexor bilaterally. Gait and Station: Rising up  from seated position without assistance, normal stance,  moderate stride, good arm swing, smooth turning, Tandem gait is  mildly unsteady.   DIAGNOSTIC DATA (LABS, IMAGING, TESTING) - I reviewed patient records, labs, notes, testing and imaging myself where available.  Lab Results  Component Value Date   WBC 10.2 11/06/2013   HGB 13.0 11/06/2013   HCT 39.0 11/06/2013   MCV 97.5 11/06/2013   PLT 271 11/06/2013      Component Value Date/Time   NA 137 11/06/2013 2158   K 3.9 11/06/2013 2158   CL 100 11/06/2013 2158   CO2 26 11/06/2013 2158   GLUCOSE 153* 11/06/2013 2158   BUN 21 11/06/2013 2158   CREATININE 1.01 11/06/2013 2158   CALCIUM 9.9 11/06/2013 2158   PROT 6.9 11/06/2013 2158   ALBUMIN 3.9 11/06/2013 2158   AST 23 11/06/2013 2158   ALT 16 11/06/2013 2158   ALKPHOS 77 11/06/2013 2158   BILITOT 0.8 11/06/2013 2158   GFRNONAA 58* 11/06/2013 2158   GFRAA 37* 11/06/2013 2158       ASSESSMENT AND PLAN  79 y.o. year old female  has a past medical history of gradual onset of memory problems consistent with mild cognitive impairment. Memory score is stable. Differential diagnosis includes age-related chemotherapy radiation induced versus early central nervous system degenerative disorder. She has had side effects to Aricept and Namenda in the past. She also has a pins and  needle sensation in her feet and lower extremities and is currently on gabapentin with relief of symptoms.  Continue baby aspirin daily Continue low-dose gabapentin will renew Continue exercise program Followup in 6-8 months Dennie Bible, [facility], [facility], APRN  [facility] [address], [place], [place] [address] [phone]

## 2014-05-25 ENCOUNTER — Inpatient Hospital Stay (HOSPITAL_COMMUNITY)
Admission: EM | Admit: 2014-05-25 | Discharge: 2014-05-28 | DRG: 389 | Disposition: A | Discharge status: Home / Self Care | Payer: Medicare HMO | Attending: Internal Medicine | Admitting: Internal Medicine

## 2014-05-25 DIAGNOSIS — Z90710 Acquired absence of both cervix and uterus: Secondary | ICD-10-CM

## 2014-05-25 DIAGNOSIS — Z8542 Personal history of malignant neoplasm of other parts of uterus: Secondary | ICD-10-CM

## 2014-05-25 DIAGNOSIS — C55 Malignant neoplasm of uterus, part unspecified: Secondary | ICD-10-CM | POA: Diagnosis present

## 2014-05-25 DIAGNOSIS — K520 Gastroenteritis and colitis due to radiation: Secondary | ICD-10-CM | POA: Diagnosis present

## 2014-05-25 DIAGNOSIS — T66XXXS Radiation sickness, unspecified, sequela: Secondary | ICD-10-CM

## 2014-05-25 DIAGNOSIS — I10 Essential (primary) hypertension: Secondary | ICD-10-CM | POA: Diagnosis present

## 2014-05-25 DIAGNOSIS — E876 Hypokalemia: Secondary | ICD-10-CM | POA: Diagnosis present

## 2014-05-25 DIAGNOSIS — E785 Hyperlipidemia, unspecified: Secondary | ICD-10-CM | POA: Diagnosis present

## 2014-05-25 DIAGNOSIS — N318 Other neuromuscular dysfunction of bladder: Secondary | ICD-10-CM | POA: Diagnosis present

## 2014-05-25 DIAGNOSIS — E7800 Pure hypercholesterolemia, unspecified: Secondary | ICD-10-CM | POA: Diagnosis present

## 2014-05-25 DIAGNOSIS — R748 Abnormal levels of other serum enzymes: Secondary | ICD-10-CM | POA: Diagnosis present

## 2014-05-25 DIAGNOSIS — Z79899 Other long term (current) drug therapy: Secondary | ICD-10-CM

## 2014-05-25 DIAGNOSIS — K565 Intestinal adhesions [bands], unspecified as to partial versus complete obstruction: Secondary | ICD-10-CM | POA: Diagnosis not present

## 2014-05-25 DIAGNOSIS — R109 Unspecified abdominal pain: Secondary | ICD-10-CM | POA: Diagnosis not present

## 2014-05-25 DIAGNOSIS — F0390 Unspecified dementia without behavioral disturbance: Secondary | ICD-10-CM | POA: Diagnosis present

## 2014-05-25 DIAGNOSIS — Z7982 Long term (current) use of aspirin: Secondary | ICD-10-CM

## 2014-05-25 DIAGNOSIS — F0280 Dementia in other diseases classified elsewhere without behavioral disturbance: Secondary | ICD-10-CM | POA: Diagnosis present

## 2014-05-25 DIAGNOSIS — Y842 Radiological procedure and radiotherapy as the cause of abnormal reaction of the patient, or of later complication, without mention of misadventure at the time of the procedure: Secondary | ICD-10-CM | POA: Diagnosis present

## 2014-05-25 DIAGNOSIS — G309 Alzheimer's disease, unspecified: Secondary | ICD-10-CM | POA: Diagnosis present

## 2014-05-25 DIAGNOSIS — K56609 Unspecified intestinal obstruction, unspecified as to partial versus complete obstruction: Secondary | ICD-10-CM | POA: Diagnosis present

## 2014-05-25 HISTORY — DX: Malignant neoplasm of central portion of left female breast: C50.112

## 2014-05-25 HISTORY — DX: Gastroenteritis and colitis due to radiation: K52.0

## 2014-05-25 HISTORY — DX: Personal history of malignant neoplasm of other parts of uterus: Z85.42

## 2014-05-25 HISTORY — DX: Alzheimer's disease, unspecified: G30.9

## 2014-05-25 HISTORY — DX: Dementia in other diseases classified elsewhere without behavioral disturbance: F02.80

## 2014-05-26 ENCOUNTER — Encounter (HOSPITAL_COMMUNITY): Payer: Self-pay | Admitting: Emergency Medicine

## 2014-05-26 ENCOUNTER — Emergency Department (HOSPITAL_COMMUNITY): Payer: Medicare HMO

## 2014-05-26 DIAGNOSIS — Z79899 Other long term (current) drug therapy: Secondary | ICD-10-CM | POA: Diagnosis not present

## 2014-05-26 DIAGNOSIS — Y842 Radiological procedure and radiotherapy as the cause of abnormal reaction of the patient, or of later complication, without mention of misadventure at the time of the procedure: Secondary | ICD-10-CM | POA: Diagnosis present

## 2014-05-26 DIAGNOSIS — I10 Essential (primary) hypertension: Secondary | ICD-10-CM

## 2014-05-26 DIAGNOSIS — K56609 Unspecified intestinal obstruction, unspecified as to partial versus complete obstruction: Secondary | ICD-10-CM

## 2014-05-26 DIAGNOSIS — K520 Gastroenteritis and colitis due to radiation: Secondary | ICD-10-CM

## 2014-05-26 DIAGNOSIS — E876 Hypokalemia: Secondary | ICD-10-CM | POA: Diagnosis present

## 2014-05-26 DIAGNOSIS — Z90710 Acquired absence of both cervix and uterus: Secondary | ICD-10-CM | POA: Diagnosis not present

## 2014-05-26 DIAGNOSIS — T66XXXS Radiation sickness, unspecified, sequela: Secondary | ICD-10-CM | POA: Diagnosis not present

## 2014-05-26 DIAGNOSIS — E785 Hyperlipidemia, unspecified: Secondary | ICD-10-CM | POA: Diagnosis present

## 2014-05-26 DIAGNOSIS — R109 Unspecified abdominal pain: Secondary | ICD-10-CM | POA: Diagnosis present

## 2014-05-26 DIAGNOSIS — N318 Other neuromuscular dysfunction of bladder: Secondary | ICD-10-CM | POA: Diagnosis present

## 2014-05-26 DIAGNOSIS — F0280 Dementia in other diseases classified elsewhere without behavioral disturbance: Secondary | ICD-10-CM | POA: Diagnosis present

## 2014-05-26 DIAGNOSIS — Z7982 Long term (current) use of aspirin: Secondary | ICD-10-CM | POA: Diagnosis not present

## 2014-05-26 DIAGNOSIS — Z8542 Personal history of malignant neoplasm of other parts of uterus: Secondary | ICD-10-CM | POA: Diagnosis not present

## 2014-05-26 DIAGNOSIS — E7800 Pure hypercholesterolemia, unspecified: Secondary | ICD-10-CM

## 2014-05-26 DIAGNOSIS — G309 Alzheimer's disease, unspecified: Secondary | ICD-10-CM | POA: Diagnosis present

## 2014-05-26 DIAGNOSIS — R748 Abnormal levels of other serum enzymes: Secondary | ICD-10-CM | POA: Diagnosis present

## 2014-05-26 DIAGNOSIS — K565 Intestinal adhesions [bands], unspecified as to partial versus complete obstruction: Secondary | ICD-10-CM | POA: Diagnosis present

## 2014-05-26 DIAGNOSIS — F0390 Unspecified dementia without behavioral disturbance: Secondary | ICD-10-CM | POA: Diagnosis present

## 2014-05-26 HISTORY — DX: Gastroenteritis and colitis due to radiation: K52.0

## 2014-05-26 LAB — CBC WITH DIFFERENTIAL/PLATELET
Basophils Absolute: 0 10*3/uL (ref 0.0–0.1)
Basophils Relative: 0 % (ref 0–1)
EOS ABS: 0.2 10*3/uL (ref 0.0–0.7)
Eosinophils Relative: 2 % (ref 0–5)
HEMATOCRIT: 40.3 % (ref 36.0–46.0)
HEMOGLOBIN: 13.5 g/dL (ref 12.0–15.0)
Lymphocytes Relative: 15 % (ref 12–46)
Lymphs Abs: 1.6 10*3/uL (ref 0.7–4.0)
MCH: 32.4 pg (ref 26.0–34.0)
MCHC: 33.5 g/dL (ref 30.0–36.0)
MCV: 96.6 fL (ref 78.0–100.0)
Monocytes Absolute: 0.7 10*3/uL (ref 0.1–1.0)
Monocytes Relative: 7 % (ref 3–12)
Neutro Abs: 8 10*3/uL — ABNORMAL HIGH (ref 1.7–7.7)
Neutrophils Relative %: 76 % (ref 43–77)
Platelets: 304 10*3/uL (ref 150–400)
RBC: 4.17 MIL/uL (ref 3.87–5.11)
RDW: 12.9 % (ref 11.5–15.5)
WBC: 10.6 10*3/uL — ABNORMAL HIGH (ref 4.0–10.5)

## 2014-05-26 LAB — HEPATIC FUNCTION PANEL
ALK PHOS: 76 U/L (ref 39–117)
ALT: 14 U/L (ref 0–35)
AST: 21 U/L (ref 0–37)
Albumin: 4 g/dL (ref 3.5–5.2)
Bilirubin, Direct: <0.2 mg/dL (ref 0.0–0.3)
TOTAL PROTEIN: 6.9 g/dL (ref 6.0–8.3)
Total Bilirubin: 0.6 mg/dL (ref 0.3–1.2)

## 2014-05-26 LAB — URINALYSIS, ROUTINE W REFLEX MICROSCOPIC
BILIRUBIN URINE: NEGATIVE
Glucose, UA: NEGATIVE mg/dL
HGB URINE DIPSTICK: NEGATIVE
Ketones, ur: NEGATIVE mg/dL
Nitrite: NEGATIVE
Protein, ur: NEGATIVE mg/dL
Specific Gravity, Urine: 1.028 (ref 1.005–1.030)
UROBILINOGEN UA: 0.2 mg/dL (ref 0.0–1.0)
pH: 7 (ref 5.0–8.0)

## 2014-05-26 LAB — I-STAT CHEM 8, ED
BUN: 17 mg/dL (ref 6–23)
Calcium, Ion: 1.11 mmol/L — ABNORMAL LOW (ref 1.13–1.30)
Chloride: 105 mEq/L (ref 96–112)
Creatinine, Ser: 0.8 mg/dL (ref 0.50–1.10)
GLUCOSE: 119 mg/dL — AB (ref 70–99)
HCT: 43 % (ref 36.0–46.0)
HEMOGLOBIN: 14.6 g/dL (ref 12.0–15.0)
Potassium: 4.1 mEq/L (ref 3.7–5.3)
Sodium: 139 mEq/L (ref 137–147)
TCO2: 24 mmol/L (ref 0–100)

## 2014-05-26 LAB — GLUCOSE, CAPILLARY
GLUCOSE-CAPILLARY: 121 mg/dL — AB (ref 70–99)
Glucose-Capillary: 119 mg/dL — ABNORMAL HIGH (ref 70–99)
Glucose-Capillary: 103 mg/dL — ABNORMAL HIGH (ref 70–99)

## 2014-05-26 LAB — URINE MICROSCOPIC-ADD ON

## 2014-05-26 LAB — LIPASE, BLOOD: Lipase: 61 U/L — ABNORMAL HIGH (ref 11–59)

## 2014-05-26 MED ORDER — ACETAMINOPHEN 650 MG RE SUPP: 650.0000 mg | Freq: Four times a day (QID) | RECTAL | Status: DC | PRN

## 2014-05-26 MED ORDER — CHLORHEXIDINE GLUCONATE 0.12 % MT SOLN
15.0000 mL | Freq: Two times a day (BID) | OROMUCOSAL | Status: DC
  Administered 2014-05-26 – 2014-05-27 (×2): 15 mL via OROMUCOSAL
  Filled 2014-05-26 (×7): qty 15

## 2014-05-26 MED ORDER — FENTANYL CITRATE 0.05 MG/ML IJ SOLN
25.0000 ug | INTRAMUSCULAR | Status: DC | PRN
  Administered 2014-05-26: 50 ug via INTRAVENOUS
  Filled 2014-05-26: qty 2

## 2014-05-26 MED ORDER — IOHEXOL 300 MG/ML  SOLN
50.0000 mL | Freq: Once | INTRAMUSCULAR | Status: AC | PRN
  Administered 2014-05-26: 50 mL via ORAL

## 2014-05-26 MED ORDER — BISACODYL 10 MG RE SUPP
10.0000 mg | Freq: Every day | RECTAL | Status: DC
  Administered 2014-05-26 – 2014-05-28 (×3): 10 mg via RECTAL
  Filled 2014-05-26 (×3): qty 1

## 2014-05-26 MED ORDER — LIP MEDEX EX OINT
1.0000 "application " | TOPICAL_OINTMENT | Freq: Two times a day (BID) | CUTANEOUS | Status: DC
  Administered 2014-05-26 – 2014-05-28 (×5): 1 via TOPICAL
  Filled 2014-05-26 (×2): qty 7

## 2014-05-26 MED ORDER — LACTATED RINGERS IV BOLUS (SEPSIS): 1000.0000 mL | Freq: Three times a day (TID) | INTRAVENOUS | Status: AC | PRN

## 2014-05-26 MED ORDER — PANTOPRAZOLE SODIUM 40 MG IV SOLR
40.0000 mg | INTRAVENOUS | Status: DC
Start: 2014-05-26 — End: 2014-05-27
  Administered 2014-05-26 – 2014-05-27 (×2): 40 mg via INTRAVENOUS
  Filled 2014-05-26 (×3): qty 40

## 2014-05-26 MED ORDER — MENTHOL 3 MG MT LOZG
1.0000 | LOZENGE | OROMUCOSAL | Status: DC | PRN
  Filled 2014-05-26: qty 9

## 2014-05-26 MED ORDER — ACETAMINOPHEN 325 MG PO TABS: 650.0000 mg | ORAL_TABLET | Freq: Four times a day (QID) | ORAL | Status: DC | PRN

## 2014-05-26 MED ORDER — FENTANYL CITRATE 0.05 MG/ML IJ SOLN
50.0000 ug | Freq: Once | INTRAMUSCULAR | Status: AC
  Administered 2014-05-26: 50 ug via INTRAVENOUS
  Filled 2014-05-26: qty 2

## 2014-05-26 MED ORDER — BIOTENE DRY MOUTH MT LIQD
15.0000 mL | Freq: Two times a day (BID) | OROMUCOSAL | Status: DC
  Administered 2014-05-26: 15 mL via OROMUCOSAL

## 2014-05-26 MED ORDER — METOPROLOL TARTRATE 1 MG/ML IV SOLN
2.5000 mg | Freq: Four times a day (QID) | INTRAVENOUS | Status: DC
Start: 2014-05-26 — End: 2014-05-27
  Administered 2014-05-26 (×3): 2.5 mg via INTRAVENOUS
  Filled 2014-05-26 (×9): qty 5

## 2014-05-26 MED ORDER — FENTANYL CITRATE 0.05 MG/ML IJ SOLN
50.0000 ug | Freq: Once | INTRAMUSCULAR | Status: AC
Start: 2014-05-26 — End: 2014-05-26
  Administered 2014-05-26: 50 ug via INTRAVENOUS
  Filled 2014-05-26: qty 2

## 2014-05-26 MED ORDER — ENOXAPARIN SODIUM 40 MG/0.4ML ~~LOC~~ SOLN
40.0000 mg | SUBCUTANEOUS | Status: DC
  Administered 2014-05-26 – 2014-05-28 (×2): 40 mg via SUBCUTANEOUS
  Filled 2014-05-26 (×3): qty 0.4

## 2014-05-26 MED ORDER — ONDANSETRON HCL 4 MG/2ML IJ SOLN
4.0000 mg | Freq: Four times a day (QID) | INTRAMUSCULAR | Status: DC | PRN
Start: 2014-05-26 — End: 2014-05-26

## 2014-05-26 MED ORDER — ONDANSETRON HCL 4 MG PO TABS: 4.0000 mg | ORAL_TABLET | Freq: Four times a day (QID) | ORAL | Status: DC | PRN

## 2014-05-26 MED ORDER — ONDANSETRON HCL 4 MG/2ML IJ SOLN
4.0000 mg | Freq: Once | INTRAMUSCULAR | Status: AC
  Administered 2014-05-26: 4 mg via INTRAVENOUS
  Filled 2014-05-26: qty 2

## 2014-05-26 MED ORDER — IOHEXOL 300 MG/ML  SOLN
100.0000 mL | Freq: Once | INTRAMUSCULAR | Status: AC | PRN
  Administered 2014-05-26: 100 mL via INTRAVENOUS

## 2014-05-26 MED ORDER — DIPHENHYDRAMINE HCL 50 MG/ML IJ SOLN: 12.5000 mg | Freq: Four times a day (QID) | INTRAMUSCULAR | Status: DC | PRN

## 2014-05-26 MED ORDER — ALUM & MAG HYDROXIDE-SIMETH 200-200-20 MG/5ML PO SUSP: 30.0000 mL | Freq: Four times a day (QID) | ORAL | Status: DC | PRN

## 2014-05-26 MED ORDER — ONDANSETRON 8 MG/NS 50 ML IVPB
8.0000 mg | Freq: Four times a day (QID) | INTRAVENOUS | Status: DC | PRN
Start: 2014-05-26 — End: 2014-05-28
  Filled 2014-05-26: qty 8

## 2014-05-26 MED ORDER — MORPHINE SULFATE 2 MG/ML IJ SOLN: 1.0000 mg | INTRAMUSCULAR | Status: DC | PRN

## 2014-05-26 MED ORDER — PROMETHAZINE HCL 25 MG/ML IJ SOLN: 6.2500 mg | Freq: Four times a day (QID) | INTRAMUSCULAR | Status: DC | PRN

## 2014-05-26 MED ORDER — PHENOL 1.4 % MT LIQD
2.0000 | OROMUCOSAL | Status: DC | PRN
  Filled 2014-05-26: qty 177

## 2014-05-26 MED ORDER — METOPROLOL TARTRATE 1 MG/ML IV SOLN
5.0000 mg | Freq: Four times a day (QID) | INTRAVENOUS | Status: DC | PRN
  Filled 2014-05-26: qty 5

## 2014-05-26 MED ORDER — MAGIC MOUTHWASH
15.0000 mL | Freq: Four times a day (QID) | ORAL | Status: DC | PRN
  Filled 2014-05-26: qty 15

## 2014-05-26 MED ORDER — ONDANSETRON HCL 4 MG/2ML IJ SOLN: 4.0000 mg | Freq: Four times a day (QID) | INTRAMUSCULAR | Status: DC | PRN

## 2014-05-26 MED ORDER — DEXTROSE-NACL 5-0.9 % IV SOLN
INTRAVENOUS | Status: AC
  Administered 2014-05-26 (×2): via INTRAVENOUS

## 2014-05-26 MED ORDER — LACTATED RINGERS IV BOLUS (SEPSIS)
1000.0000 mL | Freq: Once | INTRAVENOUS | Status: AC
  Administered 2014-05-26: 1000 mL via INTRAVENOUS

## 2014-05-26 MED ORDER — LIDOCAINE HCL 2 % EX GEL
CUTANEOUS | Status: AC
  Administered 2014-05-26: 10
  Filled 2014-05-26: qty 10

## 2014-05-26 NOTE — Consult Note (Signed)
[address] [place] [address] Phone: [phone] FAX: [place] A [place]  [birth year] [id number]  CARE TEAM:  PCP: Kandice Hams, MD  Outpatient Care Team: Patient Care Team: Kandice Hams, MD as PCP - General (Internal Medicine)  Inpatient Treatment Team: Treatment Team: Attending Provider: Rise Patience, MD  This patient is a 79 y.o.female who presents today for surgical evaluation at the request of Dr Randal Buba, [facility] ED.   Reason for evaluation: Recurrent SBO  Pleasant elderly woman with history of carcinosarcoma of the uterus incidentally found after hysterectomy 2010.  Believe she got radiation & Taxol/carboplatin chemotherapy.  Followed at [facility] with Dr. Polly Cobia.  No evidence of recurrences of visit earlier this year.  Also followed by Dr. Harold Barban with urology in [place].  She has had recurrent bowel obstructions in 2013 in 2014.  Resolved with conservative measures (bowel rest, nasogastric tube, hydration).  Comes back with recurrent abdominal pain and nausea and vomiting.  Started yesterday.  She was vomiting in the emergency room.  Nasogastric tube placed with some relief.  Concern for recurrent bouts traction by history physical and CT scan.  Surgical consultation requested.  Patient has Alzheimer's disease with progressive memory loss.  Followed by [facility] neurology.  Also with hypertension, hyperlipidemia, and other health issues.  Husband at bedside.  Past Medical History  Diagnosis Date  . Hypertension   . Overactive bladder   . Sarcoma of central portion of left female breast   . High cholesterol   . Ileus   . Abdominal adhesions   . Carcinosarcoma of uterus s/p TAH BSO 2010 11/22/2012    REPORT OF SURGICAL PATHOLOGY  Case #: [id number] Patient Name: MIKIA, DELALUZ Office Chart Number: N/A  MRN: [id number] Pathologist: Vonna Kotyk B. Lyndon Code,  MD DOB/Age 11/13/1934 (Age: 28) Gender: F Date Taken: 07/28/2009 Date Received: 07/28/2009  FINAL DIAGNOSIS  MICROSCOPIC EXAMINATION AND DIAGNOSIS  UTERUS, OVARIES AND FALLOPIAN TUBES, HYSTERECTOMY AND BILATERAL SALPINGO-OOPHORECTOMY: - CARCINOSARCOMA, ARISING FROM THE LOWER UTERINE SEGMENT, SPANNING 4.5 CM. - CARCINOSARCOMA IS 0.1 CM TO THE DEEP SOFT TISSUE RESECTION MARGIN. - CERVIX: ATROPHY AND CHRONIC INFLAMMATION. - ENDOMETRIUM: ATROPHIC APPEARING ENDOMETRIUM. - MYOMETRIUM: LEIOMYOMATA. - SEROSA: ESSENTIALLY UNREMARKABLE. - ADNEXA: PARATUBAL CYST  COMMENT Immunohistochemical stains were performed on the malignant neoplasm with the following results. The controls stained appropriately.  P16 Positive Estrogen receptor Scattered positive cells Progesterone receptor Scattered positive cells Cytokeratin AE1/AE3 Positive in epithe  . 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 Alzheimer's disease with memory loss 06/25/2013    Past Surgical History  Procedure Laterality Date  . Abdominal hysterectomy    . Breast biopsy    . Ankle surgery    . Hernia repair      as a child    History   Social History  . Marital Status: Married    Spouse Name: Eddie Dibbles     Number of Children: 1  . Years of Education: HS    Occupational History  . retired     Social History Main Topics  . Smoking status: Never Smoker   . Smokeless tobacco: Never Used  . Alcohol Use: No  . Drug Use: No  . Sexual Activity: No     Comment: married   Other Topics Concern  . Not on file   Social History Narrative   Patient lives at home with home  Eddie Dibbles her husband.    Patient has 1 child.    Patient is retired.    Patient has a high school education.    Patient is right handed    Family History  Problem Relation Age of Onset  . Cancer Father     pancreatic     Current Facility-Administered Medications  Medication Dose Route Frequency Provider Last Rate Last Dose  . acetaminophen  (TYLENOL) tablet 650 mg  650 mg Oral Q6H PRN Rise Patience, MD       Or  . acetaminophen (TYLENOL) suppository 650 mg  650 mg Rectal Q6H PRN Rise Patience, MD      . dextrose 5 %-0.9 % sodium chloride infusion   Intravenous Continuous Rise Patience, MD      . enoxaparin (LOVENOX) injection 40 mg  40 mg Subcutaneous Q24H Rise Patience, MD      . metoprolol (LOPRESSOR) injection 2.5 mg  2.5 mg Intravenous 4 times per day Rise Patience, MD      . morphine 2 MG/ML injection 1 mg  1 mg Intravenous Q4H PRN Rise Patience, MD      . ondansetron [facility]) tablet 4 mg  4 mg Oral Q6H PRN Rise Patience, MD       Or  . ondansetron (ZOFRAN) injection 4 mg  4 mg Intravenous Q6H PRN Rise Patience, MD         Allergies  Allergen Reactions  . Sulfa Antibiotics Anaphylaxis    ROS: Constitutional:  No fevers, chills, sweats.  Weight stable Eyes:  No vision changes, No discharge HENT:  No sore throats, nasal drainage Lymph: No neck swelling, No bruising easily Pulmonary:  No cough, productive sputum CV: No orthopnea, PND  No exertional chest/neck/shoulder/arm pain. GI:  No personal nor family history of GI/colon cancer, inflammatory bowel disease, irritable bowel syndrome, allergy such as Celiac Sprue, dietary/dairy problems, colitis, ulcers nor gastritis.  No recent sick contacts/gastroenteritis.  No travel outside the country.  No changes in diet. Renal: No UTIs, No hematuria Genital:  No drainage, bleeding, masses Musculoskeletal: No severe joint pain.  Good ROM major joints Skin:  No sores or lesions.  No rashes Heme/Lymph:  No easy bleeding.  No swollen lymph nodes Neuro: No focal weakness/numbness.  No seizures Psych: No suicidal ideation.  No hallucinations  BP 139/61  Pulse 58  Temp(Src) 97.6 F (36.4 C) (Oral)  Resp 18  Ht 5' (1.524 m)  Wt 122 lb (55.339 kg)  BMI 23.83 kg/m2  SpO2 100%  Physical Exam: General: Pt awake/alert/oriented x4  in no major acute distress Eyes: PERRL, normal EOM. Sclera nonicteric Neuro: CN II-XII intact w/o focal sensory/motor deficits. Lymph: No head/neck/groin lymphadenopathy Psych:  No delerium/psychosis/paranoia.  Some memory loss.  Some perseveration with questions.  Moderate dementia. HENT: Normocephalic, Mucus membranes moist.  No thrush Neck: Supple, No tracheal deviation Chest: No pain.  Good respiratory excursion. CV:  Pulses intact.  Regular rhythm Abdomen: Soft, Nondistended.  Nontender.  No hernias.  No guarding.  No rebound tenderness.  No evidence of peritonitis. Genital normal external female genitalia.  No inguinal hernias Ext:  SCDs BLE.  No significant edema.  No cyanosis Skin: No petechiae / purpurea.  No major sores Musculoskeletal: No severe joint pain.  Good ROM major joints   Results:   Labs: Results for orders placed during the hospital encounter of 05/25/14 (from the past 48 hour(s))  CBC WITH DIFFERENTIAL  Status: Abnormal   Collection Time    05/26/14 12:34 AM      Result Value Ref Range   WBC 10.6 (*) 4.0 - 10.5 K/uL   RBC 4.17  3.87 - 5.11 MIL/uL   Hemoglobin 13.5  12.0 - 15.0 g/dL   HCT 40.3  36.0 - 46.0 %   MCV 96.6  78.0 - 100.0 fL   MCH 32.4  26.0 - 34.0 pg   MCHC 33.5  30.0 - 36.0 g/dL   RDW 12.9  11.5 - 15.5 %   Platelets 304  150 - 400 K/uL   Neutrophils Relative % 76  43 - 77 %   Neutro Abs 8.0 (*) 1.7 - 7.7 K/uL   Lymphocytes Relative 15  12 - 46 %   Lymphs Abs 1.6  0.7 - 4.0 K/uL   Monocytes Relative 7  3 - 12 %   Monocytes Absolute 0.7  0.1 - 1.0 K/uL   Eosinophils Relative 2  0 - 5 %   Eosinophils Absolute 0.2  0.0 - 0.7 K/uL   Basophils Relative 0  0 - 1 %   Basophils Absolute 0.0  0.0 - 0.1 K/uL  LIPASE, BLOOD     Status: Abnormal   Collection Time    05/26/14 12:34 AM      Result Value Ref Range   Lipase 61 (*) 11 - 59 U/L  HEPATIC FUNCTION PANEL     Status: None   Collection Time    05/26/14 12:34 AM      Result Value Ref  Range   Total Protein 6.9  6.0 - 8.3 g/dL   Albumin 4.0  3.5 - 5.2 g/dL   AST 21  0 - 37 U/L   ALT 14  0 - 35 U/L   Alkaline Phosphatase 76  39 - 117 U/L   Total Bilirubin 0.6  0.3 - 1.2 mg/dL   Bilirubin, Direct <0.2  0.0 - 0.3 mg/dL   Indirect Bilirubin NOT CALCULATED  0.3 - 0.9 mg/dL  I-STAT CHEM 8, ED     Status: Abnormal   Collection Time    05/26/14  2:48 AM      Result Value Ref Range   Sodium 139  137 - 147 mEq/L   Potassium 4.1  3.7 - 5.3 mEq/L   Chloride 105  96 - 112 mEq/L   BUN 17  6 - 23 mg/dL   Creatinine, Ser 0.80  0.50 - 1.10 mg/dL   Glucose, Bld 119 (*) 70 - 99 mg/dL   Calcium, Ion 1.11 (*) 1.13 - 1.30 mmol/L   TCO2 24  0 - 100 mmol/L   Hemoglobin 14.6  12.0 - 15.0 g/dL   HCT 43.0  36.0 - 46.0 %  URINALYSIS, ROUTINE W REFLEX MICROSCOPIC     Status: Abnormal   Collection Time    05/26/14  3:36 AM      Result Value Ref Range   Color, Urine YELLOW  YELLOW   APPearance CLEAR  CLEAR   Specific Gravity, Urine 1.028  1.005 - 1.030   pH 7.0  5.0 - 8.0   Glucose, UA NEGATIVE  NEGATIVE mg/dL   Hgb urine dipstick NEGATIVE  NEGATIVE   Bilirubin Urine NEGATIVE  NEGATIVE   Ketones, ur NEGATIVE  NEGATIVE mg/dL   Protein, ur NEGATIVE  NEGATIVE mg/dL   Urobilinogen, UA 0.2  0.0 - 1.0 mg/dL   Nitrite NEGATIVE  NEGATIVE   Leukocytes, UA TRACE (*) NEGATIVE  URINE MICROSCOPIC-ADD  ON     Status: Abnormal   Collection Time    05/26/14  3:36 AM      Result Value Ref Range   Squamous Epithelial / LPF RARE  RARE   WBC, UA 0-2  <3 WBC/hpf   RBC / HPF 0-2  <3 RBC/hpf   Bacteria, UA FEW (*) RARE   Urine-Other MUCOUS PRESENT      Imaging / Studies: Dg Chest 2 View  05/26/2014   CLINICAL DATA:  Upper abdominal pain and nausea  EXAM: CHEST  2 VIEW  COMPARISON:  07/30/2013  FINDINGS: Stable positioning of a right IJ porta catheter, tip at the SVC level. Normal heart size and upper mediastinal contours for age. There is mild pulmonary hyperinflation. There is no edema,  consolidation, effusion, or pneumothorax. Lumpectomy changes noted in the left breast.  IMPRESSION: No active cardiopulmonary disease.   Electronically Signed   By: Jorje Guild M.D.   On: 05/26/2014 01:46   Ct Abdomen Pelvis W Contrast  05/26/2014   CLINICAL DATA:  Abdominal pain  EXAM: CT ABDOMEN AND PELVIS WITH CONTRAST  TECHNIQUE: Multidetector CT imaging of the abdomen and pelvis was performed using the standard protocol following bolus administration of intravenous contrast.  CONTRAST:  100 cc low osmolar iodinated contrast.  COMPARISON:  11/07/2013  FINDINGS: BODY WALL: Unremarkable.  LOWER CHEST: Unremarkable.  ABDOMEN/PELVIS:  Liver: No focal abnormality.  Biliary: No evidence of biliary obstruction or stone.  Pancreas: Unremarkable.  Spleen: Unremarkable.  Adrenals: Unremarkable.  Kidneys and ureters: No hydronephrosis or stone.  Bladder: Unremarkable.  Reproductive: Hysterectomy and probable oophorectomy  Bowel: There is a cluster of ileal loops in the pelvis which are thickened by submucosal edema. Proximal to the thickened loops are dilated, fluid-filled small bowel segments with congested mesentery and interloop fluid. Beyond the thickened loops, the bowel it is decompressed. The appendix is negative.  Retroperitoneum: No mass or adenopathy.  Peritoneum: Small volume ascites, considered reactive. There is no pneumoperitoneum.  Vascular: No acute abnormality. Mesenteric vessels to the thickened bowel loops are patent.  OSSEOUS: Advanced L4-5 and L5-S1 facet osteoarthritis with anterolisthesis.  IMPRESSION: High-grade distal small bowel obstruction. There is bowel edema at the level of the transition point which could be the cause or result of the obstruction.   Electronically Signed   By: Jorje Guild M.D.   On: 05/26/2014 04:21    Medications / Allergies: per chart  Antibiotics: Anti-infectives   None      Assessment  Johnell Comings  79 y.o. 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  Usually resolves with conservative measures.  Plan:  Agree with admission.  Nasogastric tube decompression.  Fluid resuscitation.  Followup x-ray films tomorrow.  Usually contrast gets into the colon by the next day.  If contrast and gas and colon, hot very high will resolve without operation.  If failure to improve or worsens, may need operative intervention.  Given her dementia and probable significant adhesions, would like to hold off on surgery unless no other options available.  Discussed with the patient:  The anatomy & physiology of the digestive tract was discussed.  The pathophysiology of perforation was discussed.  Differential diagnosis such as perforated ulcer or colon, etc was discussed.  Natural history risks without surgery such as death was discussed.  I recommended abdominal exploration to diagnose & treat the source of the problem.  Laparoscopic & open techniques were discussed.   Risks such as bleeding, infection, abscess, leak, reoperation, bowel resection, possible ostomy, hernia, heart attack, death, and other risks were discussed.   The risks of no intervention will lead to serious problems including death.   I expressed a good likelihood that surgery will address the problem.    Goals of post-operative recovery were discussed as well.  We will work to minimize complications although risks in an emergent setting are high.   Questions were answered.  The patient expressed understanding.       -Controlled hypertension per medicine  -VTE prophylaxis- SCDs, etc -mobilize as tolerated to help recovery.  Physical therapy Dr.  Collene Mares therapy evaluation to see how independent she is.    Adin Hector, M.D.,  F.A.C.S. Gastrointestinal and Minimally Invasive Surgery [facility] [address], [place], [place] [address] [phone] Main / Paging   05/26/2014  Note: Portions of this report may have been transcribed using voice recognition software. Every effort was made to ensure accuracy; however, inadvertent computerized transcription errors may be present.   Any transcriptional errors that result from this process are unintentional.

## 2014-05-26 NOTE — Progress Notes (Signed)
[facility] Progress Note     Subjective: NG output 258mL, NG tube came out accidentally yesterday during confusion.  Not hungry, but thirsty.  No N/V, abdominal pain resolved and not asking for pain meds, not distended.  Husband at bedside to help with patients confused state.  Objective: Vital signs in last 24 hours: Temp:  [97.6 F (36.4 C)-99 F (37.2 C)] 99 F (37.2 C) (07/15 2054) Pulse Rate:  [58-72] 68 (07/15 2345) Resp:  [16-18] 18 (07/15 2054) BP: (123-139)/(56-61) 129/56 mmHg (07/15 2345) SpO2:  [96 %-100 %] 96 % (07/15 2054) Weight:  [122 lb (55.339 kg)] 122 lb (55.339 kg) (07/15 0609) Last BM Date: 05/25/14  Intake/Output from previous day: 07/15 0701 - 07/16 0700 In: 908.8 [I.V.:908.8] Out: 250 [Emesis/NG output:250] Intake/Output this shift: Total I/O In: 908.8 [I.V.:908.8] Out: 250 [Emesis/NG output:250]  PE: Gen:  Alert, NAD, pleasant Abd: Soft, NT/ND, +BS, no HSM   Lab Results:   Recent Labs  05/26/14 0034 05/26/14 0248 05/27/14 0424  WBC 10.6*  --  6.3  HGB 13.5 14.6 11.2*  HCT 40.3 43.0 32.9*  PLT 304  --  260   BMET  Recent Labs  05/26/14 0248 05/27/14 0424  NA 139 143  K 4.1 3.5*  CL 105 108  CO2  --  26  GLUCOSE 119* 108*  BUN 17 7  CREATININE 0.80 0.82  CALCIUM  --  8.4   PT/INR No results found for this basename: LABPROT, INR,  in the last 72 hours CMP     Component Value Date/Time   NA 143 05/27/2014 0424   K 3.5* 05/27/2014 0424   CL 108 05/27/2014 0424   CO2 26 05/27/2014 0424   GLUCOSE 108* 05/27/2014 0424   BUN 7 05/27/2014 0424   CREATININE 0.82 05/27/2014 0424   CALCIUM 8.4 05/27/2014 0424   PROT 6.9 05/26/2014 0034   ALBUMIN 4.0 05/26/2014 0034   AST 21 05/26/2014 0034   ALT 14 05/26/2014 0034   ALKPHOS 76 05/26/2014 0034   BILITOT 0.6 05/26/2014 0034   GFRNONAA 67* 05/27/2014 0424   GFRAA 78* 05/27/2014 0424   Lipase     Component Value Date/Time   LIPASE 61* 05/26/2014 0034        Studies/Results: Dg Chest 2 View  05/26/2014   CLINICAL DATA:  Upper abdominal pain and nausea  EXAM: CHEST  2 VIEW  COMPARISON:  07/30/2013  FINDINGS: Stable positioning of a right IJ porta catheter, tip at the SVC level. Normal heart size and upper mediastinal contours for age. There is mild pulmonary hyperinflation. There is no edema, consolidation, effusion, or pneumothorax. Lumpectomy changes noted in the left breast.  IMPRESSION: No active cardiopulmonary disease.   Electronically Signed   By: Jorje Guild M.D.   On: 05/26/2014 01:46   Ct Abdomen Pelvis W Contrast  05/26/2014   CLINICAL DATA:  Abdominal pain  EXAM: CT ABDOMEN AND PELVIS WITH CONTRAST  TECHNIQUE: Multidetector CT imaging of the abdomen and pelvis was performed using the standard protocol following bolus administration of intravenous contrast.  CONTRAST:  100 cc low osmolar iodinated contrast.  COMPARISON:  11/07/2013  FINDINGS: BODY WALL: Unremarkable.  LOWER CHEST: Unremarkable.  ABDOMEN/PELVIS:  Liver: No focal abnormality.  Biliary: No evidence of biliary obstruction or stone.  Pancreas: Unremarkable.  Spleen: Unremarkable.  Adrenals: Unremarkable.  Kidneys and ureters: No hydronephrosis or stone.  Bladder: Unremarkable.  Reproductive: Hysterectomy and probable oophorectomy  Bowel: There is a cluster of ileal  loops in the pelvis which are thickened by submucosal edema. Proximal to the thickened loops are dilated, fluid-filled small bowel segments with congested mesentery and interloop fluid. Beyond the thickened loops, the bowel it is decompressed. The appendix is negative.  Retroperitoneum: No mass or adenopathy.  Peritoneum: Small volume ascites, considered reactive. There is no pneumoperitoneum.  Vascular: No acute abnormality. Mesenteric vessels to the thickened bowel loops are patent.  OSSEOUS: Advanced L4-5 and L5-S1 facet osteoarthritis with anterolisthesis.  IMPRESSION: High-grade distal small bowel obstruction.  There is bowel edema at the level of the transition point which could be the cause or result of the obstruction.   Electronically Signed   By: Jorje Guild M.D.   On: 05/26/2014 04:21    Anti-infectives: Anti-infectives   None       Assessment/Plan Recurrent SBO, 3rd time, since 2013; with prior hysterectomy/radiation rx for uterine cancer.  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  Conservative mgt appears to be working well.  NG now accidentally out.  Advance diet to clears.   2.  Ambulate and IS 3.  SCD's and lovenox 4.  Labs okay (mild hypokalemia), KUB pending 5.  Hopefully she will resolve again like she has in the past without surgical intervention     LOS: 2 days    DORT, Mellanie Bejarano 05/27/2014, 6:02 AM Pager: [phone]

## 2014-05-26 NOTE — Progress Notes (Signed)
Patient interviewed and examined, agree with PA note above. She is much more comfortable after placement of NG tube. Abdomen is soft and nontender.  Edward Jolly MD, FACS  05/26/2014 5:08 PM

## 2014-05-26 NOTE — Progress Notes (Signed)
Subjective: She looks comfortable, no nausea or pain right now, NG functioning.    Objective: Vital signs in last 24 hours: Temp:  [97.6 F (36.4 C)-98.4 F (36.9 C)] 97.6 F (36.4 C) (07/15 0609) Pulse Rate:  [58-65] 58 (07/15 0609) Resp:  [11-18] 18 (07/15 0609) BP: (119-139)/(61-97) 139/61 mmHg (07/15 0609) SpO2:  [98 %-100 %] 100 % (07/15 0609) Weight:  [55.339 kg (122 lb)] 55.339 kg (122 lb) (07/15 0609) Last BM Date: 05/25/14 500 from the NG recorded. Afebrile, VSS Labs from Admit Film:  High grade distal obstruction, bowel edema at the level of the transition point. Intake/Output from previous day: 07/14 0701 - 07/15 0700 In: -  Out: 500 [Emesis/NG output:500] Intake/Output this shift:    General appearance: alert, cooperative and no distress GI: soft, not very distended, she isn't tender right now.  she has no bowel sounds.  Lab Results:   Recent Labs  05/26/14 0034 05/26/14 0248  WBC 10.6*  --   HGB 13.5 14.6  HCT 40.3 43.0  PLT 304  --     BMET  Recent Labs  05/26/14 0248  NA 139  K 4.1  CL 105  GLUCOSE 119*  BUN 17  CREATININE 0.80   PT/INR No results found for this basename: LABPROT, INR,  in the last 72 hours   Recent Labs Lab 05/26/14 0034  AST 21  ALT 14  ALKPHOS 76  BILITOT 0.6  PROT 6.9  ALBUMIN 4.0     Lipase     Component Value Date/Time   LIPASE 61* 05/26/2014 0034     Studies/Results: Dg Chest 2 View  05/26/2014   CLINICAL DATA:  Upper abdominal pain and nausea  EXAM: CHEST  2 VIEW  COMPARISON:  07/30/2013  FINDINGS: Stable positioning of a right IJ porta catheter, tip at the SVC level. Normal heart size and upper mediastinal contours for age. There is mild pulmonary hyperinflation. There is no edema, consolidation, effusion, or pneumothorax. Lumpectomy changes noted in the left breast.  IMPRESSION: No active cardiopulmonary disease.   Electronically Signed   By: Jorje Guild M.D.   On: 05/26/2014 01:46   Ct  Abdomen Pelvis W Contrast  05/26/2014   CLINICAL DATA:  Abdominal pain  EXAM: CT ABDOMEN AND PELVIS WITH CONTRAST  TECHNIQUE: Multidetector CT imaging of the abdomen and pelvis was performed using the standard protocol following bolus administration of intravenous contrast.  CONTRAST:  100 cc low osmolar iodinated contrast.  COMPARISON:  11/07/2013  FINDINGS: BODY WALL: Unremarkable.  LOWER CHEST: Unremarkable.  ABDOMEN/PELVIS:  Liver: No focal abnormality.  Biliary: No evidence of biliary obstruction or stone.  Pancreas: Unremarkable.  Spleen: Unremarkable.  Adrenals: Unremarkable.  Kidneys and ureters: No hydronephrosis or stone.  Bladder: Unremarkable.  Reproductive: Hysterectomy and probable oophorectomy  Bowel: There is a cluster of ileal loops in the pelvis which are thickened by submucosal edema. Proximal to the thickened loops are dilated, fluid-filled small bowel segments with congested mesentery and interloop fluid. Beyond the thickened loops, the bowel it is decompressed. The appendix is negative.  Retroperitoneum: No mass or adenopathy.  Peritoneum: Small volume ascites, considered reactive. There is no pneumoperitoneum.  Vascular: No acute abnormality. Mesenteric vessels to the thickened bowel loops are patent.  OSSEOUS: Advanced L4-5 and L5-S1 facet osteoarthritis with anterolisthesis.  IMPRESSION: High-grade distal small bowel obstruction. There is bowel edema at the level of the transition point which could be the cause or result of the obstruction.   Electronically Signed  By: Jorje Guild M.D.   On: 05/26/2014 04:21    Medications: . antiseptic oral rinse  15 mL Mouth Rinse q12n4p  . bisacodyl  10 mg Rectal Daily  . chlorhexidine  15 mL Mouth Rinse BID  . enoxaparin (LOVENOX) injection  40 mg Subcutaneous Q24H  . lip balm  1 application Topical BID  . metoprolol  2.5 mg Intravenous 4 times per day  . pantoprazole (PROTONIX) IV  40 mg Intravenous Q24H   . dextrose 5 % and 0.9% NaCl  75 mL/hr at 05/26/14 2778    Assessment/Plan Recurrent SBO, 3rd time, since 2013; with prior hysterectomy/radiation rx for uterine cancer. 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  Conservative management for now. Labs and film, ordered for AM.  LOS: 1 day    Chantilly Linskey 05/26/2014

## 2014-05-26 NOTE — ED Notes (Signed)
Pt unable to give urine specimen at this time 

## 2014-05-26 NOTE — H&P (Signed)
[facility] History and Physical  REILEY BERTAGNOLLI XNT:[id number] DOB: 11-Oct-1935 DOA: 05/25/2014  Referring physician: ER physician. PCP: Kandice Hams, MD   Chief Complaint: Abdominal pain.  HPI: Laura Perkins is a 79 y.o. female with history of hypertension, hyperlipidemia, uterine cancer status post surgery and radiation treatment who has had previous history of recurrent small bowel obstruction presents to the ER because of abdominal pain since yesterday. Pain is mostly periumbilical and constant. Patient has had bowel movement yesterday morning. After reaching ER patient had an episode of nausea and vomiting. CT abdomen and pelvis shows features consistent with small bowel obstruction and on call surgeon was consulted and patient is admitted for further management. In addition patient's labs show elevated lipase. NG tube is being placed. Patient denies any chest pain or shortness of breath.   Review of Systems: As presented in the history of presenting illness, rest negative.  Past Medical History  Diagnosis Date  . Hypertension   . Overactive bladder   . High blood pressure   . Cancer     carcinoma sarcoma, left breast   . High cholesterol   . Ileus   . Abdominal adhesions    Past Surgical History  Procedure Laterality Date  . Abdominal hysterectomy    . Breast biopsy    . Ankle surgery    . Hernia repair      as a child   Social History:  reports that she has never smoked. She has never used smokeless tobacco. She reports that she does not drink alcohol or use illicit drugs. Where does patient live home. Can patient participate in ADLs? Yes.  Allergies  Allergen Reactions  . Sulfa Antibiotics Anaphylaxis    Family History:  Family History  Problem Relation Age of Onset  . Cancer Father     pancreatic       Prior to Admission medications   Medication Sig Start Date End Date Taking? Authorizing Provider  aspirin 81 MG chewable tablet Chew 81 mg by  mouth every morning.   Yes Historical Provider, MD  atorvastatin (LIPITOR) 20 MG tablet Take 10-20 mg by mouth daily. Alternate 10 mg (1/2 tablet) with 20 mg (1 tablet ) every other day 07/28/12  Yes Historical Provider, MD  calcium-vitamin D (OSCAL WITH D) 500-200 MG-UNIT per tablet Take 2 tablets by mouth every morning.   Yes Historical Provider, MD  carboxymethylcellulose (REFRESH PLUS) 0.5 % SOLN Place 1 drop into both eyes 3 (three) times daily as needed (for irritated eyes).   Yes Historical Provider, MD  gabapentin (NEURONTIN) 100 MG capsule Take 2 capsules (200 mg total) by mouth 3 (three) times daily. 03/09/14  Yes Dennie Bible, NP  ibuprofen (ADVIL,MOTRIN) 200 MG tablet Take 400 mg by mouth every 6 (six) hours as needed for mild pain.   Yes Historical Provider, MD  Multiple Vitamin (MULTIVITAMIN WITH MINERALS) TABS Take 1 tablet by mouth daily.   Yes Historical Provider, MD  polyethylene glycol (MIRALAX / GLYCOLAX) packet Take 8 g by mouth daily.  11/24/12  Yes Robbie Lis, MD  propranolol (INDERAL) 60 MG tablet Take 60 mg by mouth 2 (two) times daily.   Yes Historical Provider, MD  simethicone (GAS-X) 80 MG chewable tablet Chew 1 tablet (80 mg total) by mouth every 6 (six) hours as needed for flatulence. 11/24/12  Yes Robbie Lis, MD    Physical Exam: Danley Danker Vitals:   05/25/14 2356 05/26/14 9675 05/26/14 9163 05/26/14 8466  BP: 119/97 119/88 132/65 131/61  Pulse: 60 64 60 60  Temp: 97.9 F (36.6 C)     TempSrc: Oral     Resp:  18 18 11   SpO2: 99% 99% 100% 98%     General:  Moderately developed and nourished.  Eyes: Anicteric no pallor.  ENT: No discharge from the ears eyes nose mouth.  Neck: No mass felt.  Cardiovascular: S1-S2 heard.  Respiratory: No rhonchi or crepitations.  Abdomen: Mildly distended bowel sounds not appreciated. No guarding rigidity.  Skin: No rash.  Musculoskeletal: No edema.  Psychiatric: Appears normal.  Neurologic: Alert awake  oriented to time place and person. Moves all extremities.  Labs on Admission:  Basic Metabolic Panel:  Recent Labs Lab 05/26/14 0248  NA 139  K 4.1  CL 105  GLUCOSE 119*  BUN 17  CREATININE 0.80   Liver Function Tests:  Recent Labs Lab 05/26/14 0034  AST 21  ALT 14  ALKPHOS 76  BILITOT 0.6  PROT 6.9  ALBUMIN 4.0    Recent Labs Lab 05/26/14 0034  LIPASE 61*   No results found for this basename: AMMONIA,  in the last 168 hours CBC:  Recent Labs Lab 05/26/14 0034 05/26/14 0248  WBC 10.6*  --   NEUTROABS 8.0*  --   HGB 13.5 14.6  HCT 40.3 43.0  MCV 96.6  --   PLT 304  --    Cardiac Enzymes: No results found for this basename: CKTOTAL, CKMB, CKMBINDEX, TROPONINI,  in the last 168 hours  BNP (last 3 results) No results found for this basename: PROBNP,  in the last 8760 hours CBG: No results found for this basename: GLUCAP,  in the last 168 hours  Radiological Exams on Admission: Dg Chest 2 View  05/26/2014   CLINICAL DATA:  Upper abdominal pain and nausea  EXAM: CHEST  2 VIEW  COMPARISON:  07/30/2013  FINDINGS: Stable positioning of a right IJ porta catheter, tip at the SVC level. Normal heart size and upper mediastinal contours for age. There is mild pulmonary hyperinflation. There is no edema, consolidation, effusion, or pneumothorax. Lumpectomy changes noted in the left breast.  IMPRESSION: No active cardiopulmonary disease.   Electronically Signed   By: Jorje Guild M.D.   On: 05/26/2014 01:46   Ct Abdomen Pelvis W Contrast  05/26/2014   CLINICAL DATA:  Abdominal pain  EXAM: CT ABDOMEN AND PELVIS WITH CONTRAST  TECHNIQUE: Multidetector CT imaging of the abdomen and pelvis was performed using the standard protocol following bolus administration of intravenous contrast.  CONTRAST:  100 cc low osmolar iodinated contrast.  COMPARISON:  11/07/2013  FINDINGS: BODY WALL: Unremarkable.  LOWER CHEST: Unremarkable.  ABDOMEN/PELVIS:  Liver: No focal abnormality.   Biliary: No evidence of biliary obstruction or stone.  Pancreas: Unremarkable.  Spleen: Unremarkable.  Adrenals: Unremarkable.  Kidneys and ureters: No hydronephrosis or stone.  Bladder: Unremarkable.  Reproductive: Hysterectomy and probable oophorectomy  Bowel: There is a cluster of ileal loops in the pelvis which are thickened by submucosal edema. Proximal to the thickened loops are dilated, fluid-filled small bowel segments with congested mesentery and interloop fluid. Beyond the thickened loops, the bowel it is decompressed. The appendix is negative.  Retroperitoneum: No mass or adenopathy.  Peritoneum: Small volume ascites, considered reactive. There is no pneumoperitoneum.  Vascular: No acute abnormality. Mesenteric vessels to the thickened bowel loops are patent.  OSSEOUS: Advanced L4-5 and L5-S1 facet osteoarthritis with anterolisthesis.  IMPRESSION: High-grade distal small bowel obstruction. There  is bowel edema at the level of the transition point which could be the cause or result of the obstruction.   Electronically Signed   By: Jorje Guild M.D.   On: 05/26/2014 04:21    Assessment/Plan Principal Problem:   SBO (small bowel obstruction) Active Problems:   Hypercholesterolemia   HTN (hypertension)   Alzheimer's disease   1. Small bowel obstruction - CT scan shows high-grade distal small obstruction with transition point. On-call surgeon Dr. Johney Maine has been consulted and NG tube has been placed. Patient will be kept n.p.o. with gentle hydration pain relief medications. Follow surgery recommendations. 2. Hypertension - since patient is n.p.o. I have placed patient on scheduled dose of IV metoprolol. 3. Hyperlipidemia - continue statins once patient can take only. 4. Mildly elevated lipase - CT scan does not show any definite signs of pancreatitis. Observe.    Code Status: Full code.  Family Communication: Family at the bedside.  Disposition Plan: Admit to inpatient under Dr. Delfina Redwood.     Hatsuko Bizzarro N. [facility] Pager [phone].  If 7PM-7AM, please contact night-coverage [email or web address] Password [facility] 05/26/2014, 5:39 AM

## 2014-05-26 NOTE — Progress Notes (Signed)
Subjective: Patient pleasant, no apparent distress. She does feel a little irritation from the NG tube. She states her abdominal pain is controlled. CT scan reviewed showing high-grade small bowel obstruction.  Objective: Vital signs in last 24 hours: Temp:  [97.6 F (36.4 C)-98.4 F (36.9 C)] 97.6 F (36.4 C) (07/15 0609) Pulse Rate:  [58-65] 58 (07/15 0609) Resp:  [11-18] 18 (07/15 0609) BP: (119-139)/(61-97) 139/61 mmHg (07/15 0609) SpO2:  [98 %-100 %] 100 % (07/15 0609) Weight:  [55.339 kg (122 lb)] 55.339 kg (122 lb) (07/15 0609) Weight change:  Last BM Date: 05/25/14  Intake/Output from previous day: 07/14 0701 - 07/15 0700 In: -  Out: 500 [Emesis/NG output:500] Intake/Output this shift:    General appearance: alert and cooperative Resp: clear to auscultation bilaterally Cardio: regular rate and rhythm, S1, S2 normal, no murmur, click, rub or gallop GI: soft, very few bowel sounds, some abdominal discomfort with palpation no organomegaly appreciated Extremities: extremities normal, atraumatic, no cyanosis or edema  Lab Results:  Results for orders placed during the hospital encounter of 05/25/14 (from the past 24 hour(s))  CBC WITH DIFFERENTIAL     Status: Abnormal   Collection Time    05/26/14 12:34 AM      Result Value Ref Range   WBC 10.6 (*) 4.0 - 10.5 K/uL   RBC 4.17  3.87 - 5.11 MIL/uL   Hemoglobin 13.5  12.0 - 15.0 g/dL   HCT 40.3  36.0 - 46.0 %   MCV 96.6  78.0 - 100.0 fL   MCH 32.4  26.0 - 34.0 pg   MCHC 33.5  30.0 - 36.0 g/dL   RDW 12.9  11.5 - 15.5 %   Platelets 304  150 - 400 K/uL   Neutrophils Relative % 76  43 - 77 %   Neutro Abs 8.0 (*) 1.7 - 7.7 K/uL   Lymphocytes Relative 15  12 - 46 %   Lymphs Abs 1.6  0.7 - 4.0 K/uL   Monocytes Relative 7  3 - 12 %   Monocytes Absolute 0.7  0.1 - 1.0 K/uL   Eosinophils Relative 2  0 - 5 %   Eosinophils Absolute 0.2  0.0 - 0.7 K/uL   Basophils Relative 0  0 - 1 %   Basophils Absolute 0.0  0.0 - 0.1 K/uL   LIPASE, BLOOD     Status: Abnormal   Collection Time    05/26/14 12:34 AM      Result Value Ref Range   Lipase 61 (*) 11 - 59 U/L  HEPATIC FUNCTION PANEL     Status: None   Collection Time    05/26/14 12:34 AM      Result Value Ref Range   Total Protein 6.9  6.0 - 8.3 g/dL   Albumin 4.0  3.5 - 5.2 g/dL   AST 21  0 - 37 U/L   ALT 14  0 - 35 U/L   Alkaline Phosphatase 76  39 - 117 U/L   Total Bilirubin 0.6  0.3 - 1.2 mg/dL   Bilirubin, Direct <0.2  0.0 - 0.3 mg/dL   Indirect Bilirubin NOT CALCULATED  0.3 - 0.9 mg/dL  I-STAT CHEM 8, ED     Status: Abnormal   Collection Time    05/26/14  2:48 AM      Result Value Ref Range   Sodium 139  137 - 147 mEq/L   Potassium 4.1  3.7 - 5.3 mEq/L   Chloride 105  96 -  112 mEq/L   BUN 17  6 - 23 mg/dL   Creatinine, Ser 0.80  0.50 - 1.10 mg/dL   Glucose, Bld 119 (*) 70 - 99 mg/dL   Calcium, Ion 1.11 (*) 1.13 - 1.30 mmol/L   TCO2 24  0 - 100 mmol/L   Hemoglobin 14.6  12.0 - 15.0 g/dL   HCT 43.0  36.0 - 46.0 %  URINALYSIS, ROUTINE W REFLEX MICROSCOPIC     Status: Abnormal   Collection Time    05/26/14  3:36 AM      Result Value Ref Range   Color, Urine YELLOW  YELLOW   APPearance CLEAR  CLEAR   Specific Gravity, Urine 1.028  1.005 - 1.030   pH 7.0  5.0 - 8.0   Glucose, UA NEGATIVE  NEGATIVE mg/dL   Hgb urine dipstick NEGATIVE  NEGATIVE   Bilirubin Urine NEGATIVE  NEGATIVE   Ketones, ur NEGATIVE  NEGATIVE mg/dL   Protein, ur NEGATIVE  NEGATIVE mg/dL   Urobilinogen, UA 0.2  0.0 - 1.0 mg/dL   Nitrite NEGATIVE  NEGATIVE   Leukocytes, UA TRACE (*) NEGATIVE  URINE MICROSCOPIC-ADD ON     Status: Abnormal   Collection Time    05/26/14  3:36 AM      Result Value Ref Range   Squamous Epithelial / LPF RARE  RARE   WBC, UA 0-2  <3 WBC/hpf   RBC / HPF 0-2  <3 RBC/hpf   Bacteria, UA FEW (*) RARE   Urine-Other MUCOUS PRESENT        Studies/Results: Dg Chest 2 View  05/26/2014   CLINICAL DATA:  Upper abdominal pain and nausea  EXAM: CHEST   2 VIEW  COMPARISON:  07/30/2013  FINDINGS: Stable positioning of a right IJ porta catheter, tip at the SVC level. Normal heart size and upper mediastinal contours for age. There is mild pulmonary hyperinflation. There is no edema, consolidation, effusion, or pneumothorax. Lumpectomy changes noted in the left breast.  IMPRESSION: No active cardiopulmonary disease.   Electronically Signed   By: Jorje Guild M.D.   On: 05/26/2014 01:46   Ct Abdomen Pelvis W Contrast  05/26/2014   CLINICAL DATA:  Abdominal pain  EXAM: CT ABDOMEN AND PELVIS WITH CONTRAST  TECHNIQUE: Multidetector CT imaging of the abdomen and pelvis was performed using the standard protocol following bolus administration of intravenous contrast.  CONTRAST:  100 cc low osmolar iodinated contrast.  COMPARISON:  11/07/2013  FINDINGS: BODY WALL: Unremarkable.  LOWER CHEST: Unremarkable.  ABDOMEN/PELVIS:  Liver: No focal abnormality.  Biliary: No evidence of biliary obstruction or stone.  Pancreas: Unremarkable.  Spleen: Unremarkable.  Adrenals: Unremarkable.  Kidneys and ureters: No hydronephrosis or stone.  Bladder: Unremarkable.  Reproductive: Hysterectomy and probable oophorectomy  Bowel: There is a cluster of ileal loops in the pelvis which are thickened by submucosal edema. Proximal to the thickened loops are dilated, fluid-filled small bowel segments with congested mesentery and interloop fluid. Beyond the thickened loops, the bowel it is decompressed. The appendix is negative.  Retroperitoneum: No mass or adenopathy.  Peritoneum: Small volume ascites, considered reactive. There is no pneumoperitoneum.  Vascular: No acute abnormality. Mesenteric vessels to the thickened bowel loops are patent.  OSSEOUS: Advanced L4-5 and L5-S1 facet osteoarthritis with anterolisthesis.  IMPRESSION: High-grade distal small bowel obstruction. There is bowel edema at the level of the transition point which could be the cause or result of the obstruction.    Electronically Signed   By: Jorje Guild  M.D.   On: 05/26/2014 04:21    Medications:  I have reviewed the patient's current medications. Prior to Admission:  Prescriptions prior to admission  Medication Sig Dispense Refill  . aspirin 81 MG chewable tablet Chew 81 mg by mouth every morning.      Marland Kitchen atorvastatin (LIPITOR) 20 MG tablet Take 10-20 mg by mouth daily. Alternate 10 mg (1/2 tablet) with 20 mg (1 tablet ) every other day      . calcium-vitamin D (OSCAL WITH D) 500-200 MG-UNIT per tablet Take 2 tablets by mouth every morning.      . carboxymethylcellulose (REFRESH PLUS) 0.5 % SOLN Place 1 drop into both eyes 3 (three) times daily as needed (for irritated eyes).      . gabapentin (NEURONTIN) 100 MG capsule Take 2 capsules (200 mg total) by mouth 3 (three) times daily.  180 capsule  6  . ibuprofen (ADVIL,MOTRIN) 200 MG tablet Take 400 mg by mouth every 6 (six) hours as needed for mild pain.      . Multiple Vitamin (MULTIVITAMIN WITH MINERALS) TABS Take 1 tablet by mouth daily.      . polyethylene glycol (MIRALAX / GLYCOLAX) packet Take 8 g by mouth daily.       . propranolol (INDERAL) 60 MG tablet Take 60 mg by mouth 2 (two) times daily.      . simethicone (GAS-X) 80 MG chewable tablet Chew 1 tablet (80 mg total) by mouth every 6 (six) hours as needed for flatulence.  30 tablet  0   Scheduled: . antiseptic oral rinse  15 mL Mouth Rinse q12n4p  . bisacodyl  10 mg Rectal Daily  . chlorhexidine  15 mL Mouth Rinse BID  . enoxaparin (LOVENOX) injection  40 mg Subcutaneous Q24H  . lip balm  1 application Topical BID  . metoprolol  2.5 mg Intravenous 4 times per day  . pantoprazole (PROTONIX) IV  40 mg Intravenous Q24H   Continuous: . dextrose 5 % and 0.9% NaCl 75 mL/hr at 05/26/14 0753   JOI:NOMVEHMCNOBSJ, alum & mag hydroxide-simeth, diphenhydrAMINE, fentaNYL, lactated ringers, magic mouthwash, menthol-cetylpyridinium, metoprolol, ondansetron (ZOFRAN) IV, ondansetron (ZOFRAN) IV,  phenol, promethazine  Assessment/Plan: Principal Problem:   SBO (small bowel obstruction) - recurrent.  Continue NG tube and other conservative measures, IV fluids, bowel rest, serial x-rays. Await further input from surgery Active Problems:   Carcinosarcoma of uterus s/p TAH BSO 2010   Hypercholesterolemia   HTN (hypertension)   Alzheimer's disease with memory loss       LOS: 1 day   Jumar Greenstreet D 05/26/2014, 8:29 AM

## 2014-05-26 NOTE — Evaluation (Signed)
Physical Therapy Evaluation Patient Details Name: Laura Perkins MRN: [id number] DOB: [birth year] Today's Date: 05/26/2014   History of Present Illness  Laura Perkins is a 78 y.o. female with history of hypertension, hyperlipidemia, uterine cancer status post surgery and radiation treatment who has had previous history of recurrent small bowel obstruction presents to the ER 05/25/14  because of abdominal pain  . Marland Kitchen After reaching ER patient had an episode of nausea and vomiting. CT abdomen and pelvis shows features consistent with small bowel obstruction  Clinical Impression  Pt was able to ambulate 400' with HHA. Per spouse , plan will ben to return home. Pt will benefit from PT while in acute care.    Follow Up Recommendations No PT follow up    Equipment Recommendations  None recommended by PT    Recommendations for Other Services       Precautions / Restrictions Precautions Precautions: Fall Precaution Comments: NG tube      Mobility  Bed Mobility   Bed Mobility: Sit to Supine     Supine to sit: Supervision Sit to supine: Supervision      Transfers Overall transfer level: Needs assistance Equipment used: 1 person hand held assist Transfers: Sit to/from Stand Sit to Stand: Min guard            Ambulation/Gait Ambulation/Gait assistance: Min assist Ambulation Distance (Feet): 400 Feet Assistive device: 1 person hand held assist Gait Pattern/deviations: Step-through pattern     General Gait Details: mild balance loss wen not holding onto objects  Stairs            Wheelchair Mobility    Modified Rankin (Stroke Patients Only)       Balance Overall balance assessment: Needs assistance Sitting-balance support: No upper extremity supported;Feet supported Sitting balance-Leahy Scale: Good     Standing balance support: No upper extremity supported;During functional activity Standing balance-Leahy Scale: Fair                                Pertinent Vitals/Pain No pain    Home Living Family/patient expects to be discharged to:: Private residence Living Arrangements: Spouse/significant other Available Help at Discharge: Family         Home Layout: One level Home Equipment: None      Prior Function Level of Independence: Needs assistance   Gait / Transfers Assistance Needed: independent with ambulation in home  ADL's / Homemaking Assistance Needed: A with home management per pt        Hand Dominance        Extremity/Trunk Assessment   Upper Extremity Assessment: Overall WFL for tasks assessed           Lower Extremity Assessment: Overall WFL for tasks assessed         Communication      Cognition Arousal/Alertness: Awake/alert Behavior During Therapy: WFL for tasks assessed/performed Overall Cognitive Status: History of cognitive impairments - at baseline       Memory: Decreased short-term memory              General Comments      Exercises        Assessment/Plan    PT Assessment    PT Diagnosis Difficulty walking   PT Problem List Decreased strength;Decreased activity tolerance;Decreased balance;Decreased cognition  PT Treatment Interventions Gait training;Functional mobility training;Therapeutic activities;Patient/family education   PT Goals (Current goals can be found in the Care Plan  section) Acute Rehab PT Goals Patient Stated Goal: be able to get back home PT Goal Formulation: With patient/family Time For Goal Achievement: 06/09/14 Potential to Achieve Goals: Good    Frequency Min 3X/week   Barriers to discharge        Co-evaluation               End of Session   Activity Tolerance: Patient tolerated treatment well Patient left: in bed;with call bell/phone within reach;with bed alarm set;with family/visitor present Nurse Communication: Mobility status         Time: 2878-6767 PT Time Calculation (min): 18 min   Charges:   PT  Evaluation $Initial PT Evaluation Tier I: 1 Procedure PT Treatments $Gait Training: 8-22 mins   PT G Codes:          Claretha Cooper 05/26/2014, 4:09 PM Tresa Endo PT [phone]

## 2014-05-26 NOTE — ED Notes (Signed)
Pt c/o mid abd pain that began around 12; pt states that the pain has progressed and has now began vomiting; pt is actively vomiting in triage

## 2014-05-26 NOTE — Care Management Note (Signed)
    Page 1 of 1   05/26/2014     3:13:50 PM CARE MANAGEMENT NOTE 05/26/2014  Patient:  Laura Perkins   Account Number:  [id number]  Date Initiated:  05/26/2014  Documentation initiated by:  [facility]  Subjective/Objective Assessment:   adm: SBO     Action/Plan:   discharge planing   Anticipated DC Date:  05/29/2014   Anticipated DC Plan:  [place]  CM consult      Choice offered to / List presented to:             [place].   Status of service:  In process, will continue to follow Medicare Important Message given?   (If response is "NO", the following Medicare IM given date fields will be blank) Date Medicare IM given:   Medicare IM given by:   Date Additional Medicare IM given:   Additional Medicare IM given by:    Discharge Disposition:    Per UR Regulation:  Reviewed for med. necessity/level of care/duration of stay  If discussed at [place] of Stay Meetings, dates discussed:    Comments:  05/26/14 14:50 CM met with pt and pt's spouse, Laura Perkins [phone] to discuss plans for discharge.  Pt and spouse state they are fine with [facility] for home health agency if needed at time of discharge.  Address and contact information verified with pt and Laura Perkins.  Referral made to [facility] rep, Kristen to place pt on their hotlist.   Will continue to monitor for disposition needs.  Mariane Masters, BSN, CM [phone].

## 2014-05-26 NOTE — ED Provider Notes (Signed)
CSN: [id number]     Arrival date & time 05/25/14  2348 History   First MD Initiated Contact with Patient 05/26/14 0007     Chief Complaint  Patient presents with  . Abdominal Pain     (Consider location/radiation/quality/duration/timing/severity/associated sxs/prior Treatment) Patient is a 79 y.o. female presenting with abdominal pain. The history is provided by the patient and the spouse.  Abdominal Pain Pain location:  Periumbilical Pain quality: cramping   Pain radiates to:  Does not radiate Pain severity:  Severe Onset quality:  Gradual Timing:  Intermittent Progression:  Worsening Chronicity:  Recurrent Context: not trauma   Relieved by:  Nothing Worsened by:  Nothing tried Ineffective treatments:  None tried Associated symptoms: vomiting   Associated symptoms: no chest pain, no fever and no shortness of breath   Risk factors: no recent hospitalization     Past Medical History  Diagnosis Date  . Hypertension   . Overactive bladder   . High blood pressure   . Cancer     carcinoma sarcoma, left breast   . High cholesterol   . Ileus   . Abdominal adhesions    Past Surgical History  Procedure Laterality Date  . Abdominal hysterectomy    . Breast biopsy    . Ankle surgery    . Hernia repair      as a child   Family History  Problem Relation Age of Onset  . Cancer Father     pancreatic    History  Substance Use Topics  . Smoking status: Never Smoker   . Smokeless tobacco: Never Used  . Alcohol Use: No   OB History   Grav Para Term Preterm Abortions TAB SAB Ect Mult Living                 Review of Systems  Constitutional: Negative for fever.  Respiratory: Negative for shortness of breath.   Cardiovascular: Negative for chest pain.  Gastrointestinal: Positive for vomiting and abdominal pain.  All other systems reviewed and are negative.     Allergies  Sulfa antibiotics  Home Medications   Prior to Admission medications   Medication Sig  Start Date End Date Taking? Authorizing Provider  aspirin 81 MG chewable tablet Chew 81 mg by mouth every morning.    Historical Provider, MD  atorvastatin (LIPITOR) 20 MG tablet Take 10-20 mg by mouth daily. Alternate 10 mg (1/2 tablet) with 20 mg (1 tablet ) every other day 07/28/12   Historical Provider, MD  calcium-vitamin D (OSCAL WITH D) 500-200 MG-UNIT per tablet Take 2 tablets by mouth every morning.    Historical Provider, MD  carboxymethylcellulose (REFRESH PLUS) 0.5 % SOLN Place 1 drop into both eyes 3 (three) times daily as needed (for irritated eyes).    Historical Provider, MD  gabapentin (NEURONTIN) 100 MG capsule Take 2 capsules (200 mg total) by mouth 3 (three) times daily. 03/09/14   Dennie Bible, NP  ibuprofen (ADVIL,MOTRIN) 200 MG tablet Take 400 mg by mouth every 6 (six) hours as needed for mild pain.    Historical Provider, MD  Multiple Vitamin (MULTIVITAMIN WITH MINERALS) TABS Take 1 tablet by mouth daily.    Historical Provider, MD  polyethylene glycol (MIRALAX / GLYCOLAX) packet Take 17 g by mouth daily. Take 1/2 tbs 11/24/12   Robbie Lis, MD  propranolol ER (INDERAL LA) 120 MG 24 hr capsule Take 120 mg by mouth every morning.  08/12/12   Historical Provider, MD  simethicone (  GAS-X) 80 MG chewable tablet Chew 1 tablet (80 mg total) by mouth every 6 (six) hours as needed for flatulence. 11/24/12   Robbie Lis, MD   BP 119/97  Pulse 60  Temp(Src) 97.9 F (36.6 C) (Oral)  SpO2 99% Physical Exam  Constitutional: She is oriented to person, place, and time. She appears well-developed and well-nourished. No distress.  HENT:  Head: Normocephalic and atraumatic.  Mouth/Throat: Oropharynx is clear and moist.  Eyes: Conjunctivae are normal. Pupils are equal, round, and reactive to light.  Neck: Normal range of motion. Neck supple.  Cardiovascular: Normal rate, regular rhythm and intact distal pulses.   Pulmonary/Chest: Effort normal and breath sounds normal. She has no  wheezes. She has no rales.  Abdominal: Soft. Bowel sounds are normal. There is no tenderness. There is no rebound and no guarding.  Musculoskeletal: Normal range of motion.  Neurological: She is alert and oriented to person, place, and time.  Skin: Skin is warm and dry.  Psychiatric: She has a normal mood and affect.    ED Course  Procedures (including critical care time) Labs Review Labs Reviewed  CBC WITH DIFFERENTIAL  URINALYSIS, ROUTINE W REFLEX MICROSCOPIC  LIPASE, BLOOD  HEPATIC FUNCTION PANEL  I-STAT CHEM 8, ED    Imaging Review No results found.   EKG Interpretation None      MDM   Final diagnoses:  None    Dr. Johney Maine to see the patient admit to medicine     Skye Rodarte K Malky Rudzinski-Rasch, MD 05/26/14 [phone]

## 2014-05-26 NOTE — Evaluation (Signed)
Occupational Therapy Evaluation Patient Details Name: Laura Perkins MRN: [id number] DOB: [birth year] Today's Date: 05/26/2014    History of Present Illness Laura Perkins is a 79 y.o. female with history of hypertension, hyperlipidemia, uterine cancer status post surgery and radiation treatment who has had previous history of recurrent small bowel obstruction presents to the ER because of abdominal pain since yesterday. Pain is mostly periumbilical and constant. Patient has had bowel movement yesterday morning. After reaching ER patient had an episode of nausea and vomiting. CT abdomen and pelvis shows features consistent with small bowel obstruction   Clinical Impression   Pt presents to OT with decreased I with ADL activity due to problems below and will benefit from skilled OT to increase I with ADL activity and return to PLOF    Follow Up Recommendations  Home health OT;Supervision/Assistance - 24 hour    Equipment Recommendations  None recommended by OT       Precautions / Restrictions Precautions Precautions: Fall Precaution Comments: NG tube      Mobility Bed Mobility Overal bed mobility: Needs Assistance Bed Mobility: Supine to Sit     Supine to sit: Supervision        Transfers Overall transfer level: Needs assistance   Transfers: Sit to/from Stand Sit to Stand: Min guard                   ADL Overall ADL's : Needs assistance/impaired     Grooming: Minimal assistance;Standing   Upper Body Bathing: Minimal assitance;Sitting   Lower Body Bathing: Minimal assistance;Sit to/from stand   Upper Body Dressing : Minimal assistance;Sitting   Lower Body Dressing: Sit to/from stand   Toilet Transfer: Minimal assistance Toilet Transfer Details (indicate cue type and reason): bed to chair and back to bed Toileting- Clothing Manipulation and Hygiene: Minimal assistance         General ADL Comments: Pt overall min A with ADL activity .  Pt  presents with confusion which is baseline per chart. Will confirm PLOF with family member                Extremity/Trunk Assessment Upper Extremity Assessment Upper Extremity Assessment: Overall WFL for tasks assessed           Communication Communication Communication: No difficulties   Cognition Arousal/Alertness: Awake/alert Behavior During Therapy: WFL for tasks assessed/performed Overall Cognitive Status: History of cognitive impairments - at baseline       Memory: Decreased short-term memory             General Comments               Home Living Family/patient expects to be discharged to:: Private residence Living Arrangements: Spouse/significant other Available Help at Discharge: Family Type of Home: [place]: One level     Bathroom Shower/Tub: Tub/shower unit;Walk-in shower   Bathroom Toilet: Standard     Home Equipment: None          Prior Functioning/Environment Level of Independence: Needs assistance    ADL's / Homemaking Assistance Needed: A with home management per pt        OT Diagnosis: Generalized weakness   OT Problem List: Decreased strength;Decreased activity tolerance   OT Treatment/Interventions: Self-care/ADL training;DME and/or AE instruction;Patient/family education    OT Goals(Current goals can be found in the care plan section) Acute Rehab OT Goals Patient Stated Goal: be able to get back home OT Goal Formulation: With patient  Time For Goal Achievement: 06/09/14 Potential to Achieve Goals: Good  OT Frequency: Min 2X/week   Barriers to D/C:  need to make sure pt has 24/7 A             End of Session Nurse Communication: Mobility status  Activity Tolerance: Patient tolerated treatment well Patient left: in bed;with bed alarm set   Time: 0109-3235 OT Time Calculation (min): 28 min Charges:  OT General Charges $OT Visit: 1 Procedure OT Evaluation $Initial OT Evaluation Tier I: 1  Procedure OT Treatments $Self Care/Home Management : 23-37 mins G-Codes:    Payton Mccallum D Jun 08, 2014, 10:13 AM

## 2014-05-27 ENCOUNTER — Inpatient Hospital Stay (HOSPITAL_COMMUNITY): Payer: Medicare HMO

## 2014-05-27 DIAGNOSIS — K520 Gastroenteritis and colitis due to radiation: Secondary | ICD-10-CM

## 2014-05-27 LAB — GLUCOSE, CAPILLARY
GLUCOSE-CAPILLARY: 94 mg/dL (ref 70–99)
Glucose-Capillary: 100 mg/dL — ABNORMAL HIGH (ref 70–99)
Glucose-Capillary: 89 mg/dL (ref 70–99)
Glucose-Capillary: 90 mg/dL (ref 70–99)
Glucose-Capillary: 97 mg/dL (ref 70–99)

## 2014-05-27 LAB — BASIC METABOLIC PANEL
Anion gap: 9 (ref 5–15)
BUN: 7 mg/dL (ref 6–23)
CALCIUM: 8.4 mg/dL (ref 8.4–10.5)
CO2: 26 meq/L (ref 19–32)
CREATININE: 0.82 mg/dL (ref 0.50–1.10)
Chloride: 108 mEq/L (ref 96–112)
GFR calc Af Amer: 78 mL/min — ABNORMAL LOW (ref >90)
GFR calc non Af Amer: 67 mL/min — ABNORMAL LOW (ref >90)
GLUCOSE: 108 mg/dL — AB (ref 70–99)
Potassium: 3.5 mEq/L — ABNORMAL LOW (ref 3.7–5.3)
Sodium: 143 mEq/L (ref 137–147)

## 2014-05-27 LAB — CBC
HCT: 32.9 % — ABNORMAL LOW (ref 36.0–46.0)
HEMOGLOBIN: 11.2 g/dL — AB (ref 12.0–15.0)
MCH: 32.8 pg (ref 26.0–34.0)
MCHC: 34 g/dL (ref 30.0–36.0)
MCV: 96.5 fL (ref 78.0–100.0)
Platelets: 260 10*3/uL (ref 150–400)
RBC: 3.41 MIL/uL — ABNORMAL LOW (ref 3.87–5.11)
RDW: 13.2 % (ref 11.5–15.5)
WBC: 6.3 10*3/uL (ref 4.0–10.5)

## 2014-05-27 MED ORDER — PANTOPRAZOLE SODIUM 40 MG PO TBEC
40.0000 mg | DELAYED_RELEASE_TABLET | Freq: Every day | ORAL | Status: DC
  Administered 2014-05-28: 40 mg via ORAL
  Filled 2014-05-27: qty 1

## 2014-05-27 MED ORDER — METOPROLOL TARTRATE 25 MG PO TABS
25.0000 mg | ORAL_TABLET | Freq: Two times a day (BID) | ORAL | Status: DC
  Administered 2014-05-27 – 2014-05-28 (×2): 25 mg via ORAL
  Filled 2014-05-27 (×3): qty 1

## 2014-05-27 NOTE — Progress Notes (Signed)
Pt suffers from intermittent confusion.  NG tube on low intermittent suction was accidentally removed by patient during a moment of confusion.  MD on call for central  surgery notified.  Pt reports that she is passing gas and faint, hypoactive bowel sounds are audible on auscultation.  No orders to reinsert NG tube received at this time.  Will continue to monitor patient.

## 2014-05-27 NOTE — Progress Notes (Signed)
Physical Therapy Treatment Patient Details Name: Laura Perkins MRN: [id number] DOB: May 17, 1935 Today's Date: 05/27/2014    History of Present Illness Laura Perkins is a 79 y.o. female with history of hypertension, hyperlipidemia, uterine cancer status post surgery and radiation treatment who has had previous history of recurrent small bowel obstruction presents to the ER because of abdominal pain since yesterday. Pain is mostly periumbilical and constant. Patient has had bowel movement yesterday morning. After reaching ER patient had an episode of nausea and vomiting. CT abdomen and pelvis shows features consistent with small bowel obstruction    PT Comments    Pt OOB in recliner with son in room.  NG tube is D/C and no IV.  Amb in hallway with son several times a day.  Pt amb without any AD but did require hand held assist.  No LOB but does require < 25% cueing due to impaired cognition/memory loss.  Follow Up Recommendations  No PT follow up     Equipment Recommendations  None recommended by PT    Recommendations for Other Services       Precautions / Restrictions Precautions Precautions: Fall Restrictions Weight Bearing Restrictions: No    Mobility  Bed Mobility               General bed mobility comments: Pt OOB in recliner  Transfers Overall transfer level: Needs assistance Equipment used: 1 person hand held assist Transfers: Sit to/from Stand Sit to Stand: Supervision;Min guard         General transfer comment: <25% VC's on safety with turns but for most part does well  Ambulation/Gait Ambulation/Gait assistance: Min guard;Supervision Ambulation Distance (Feet): 400 Feet Assistive device: 1 person hand held assist Gait Pattern/deviations: Step-through pattern Gait velocity: decreased   General Gait Details: Slightly unsteady gait (drunken)   Financial trader Rankin (Stroke Patients Only)        Balance                                    Cognition Arousal/Alertness: Awake/alert Behavior During Therapy: WFL for tasks assessed/performed Overall Cognitive Status: History of cognitive impairments - at baseline       Memory: Decreased short-term memory              Exercises      General Comments        Pertinent Vitals/Pain     Home Living                      Prior Function            PT Goals (current goals can now be found in the care plan section) Progress towards PT goals: Progressing toward goals    Frequency  Min 3X/week    PT Plan      Co-evaluation             End of Session Equipment Utilized During Treatment: Gait belt Activity Tolerance: Patient tolerated treatment well Patient left: in chair;with call bell/phone within reach;with family/visitor present     Time: 1445-1458 PT Time Calculation (min): 13 min  Charges:  $Gait Training: 8-22 mins                    G Codes:      Cecille Rubin  Kentrail Shew  PTA [facility]  Acute  Rehab Pager      [phone]

## 2014-05-27 NOTE — Progress Notes (Signed)
Patient interviewed and examined, agree with PA note above.  Edward Jolly MD, FACS  05/27/2014 5:36 PM

## 2014-05-27 NOTE — Progress Notes (Signed)
Occupational Therapy Treatment Patient Details Name: Laura Perkins MRN: [id number] DOB: 04/03/1935 Today's Date: 05/27/2014    History of present illness Laura Perkins is a 79 y.o. female with history of hypertension, hyperlipidemia, uterine cancer status post surgery and radiation treatment who has had previous history of recurrent small bowel obstruction presents to the ER because of abdominal pain since yesterday. Pain is mostly periumbilical and constant. Patient has had bowel movement yesterday morning. After reaching ER patient had an episode of nausea and vomiting. CT abdomen and pelvis shows features consistent with small bowel obstruction   OT comments  Pt needed verbal cues to slow down this OT visit in bathroom.   Follow Up Recommendations  Home health OT;Supervision/Assistance - 24 hour    Equipment Recommendations  None recommended by OT       Precautions / Restrictions Precautions Precautions: Fall Restrictions Weight Bearing Restrictions: No       Mobility Bed Mobility                  Transfers Overall transfer level: Needs assistance   Transfers: Sit to/from Stand Sit to Stand: Min guard         General transfer comment: verbal cues to slow down        ADL                           Toilet Transfer: Nature conservation officer;Ambulation   Toileting- Clothing Manipulation and Hygiene: Min guard   Tub/ Shower Transfer: Min guard     General ADL Comments: husband present and did report pt was I at home with ADL activity. Pt did well going to bathroom but was anxious regarding need to have BM and suppository coming out. Pt turned quickly to look in toilet- OT educated pt to slow down and move slower to decrease chance of falling.                  Cognition   Behavior During Therapy: WFL for tasks assessed/performed Overall Cognitive Status: History of cognitive impairments - at baseline       Memory: Decreased  short-term memory                       General Comments  husband present and states pt is I at home despite STM loss      Prior Functioning/Environment         I with ADL activity      Frequency Min 2X/week     Progress Toward Goals  OT Goals(current goals can now be found in the care plan section)    progressing      Plan Discharge plan remains appropriate       End of Session     Activity Tolerance Patient tolerated treatment well   Patient Left in bed   Nurse Communication Mobility status        Time: 9211-9417 OT Time Calculation (min): 17 min  Charges: OT General Charges $OT Visit: 1 Procedure OT Treatments $Self Care/Home Management : 8-22 mins  Tremeka Helbling D 05/27/2014, 11:30 AM

## 2014-05-27 NOTE — Progress Notes (Signed)
Subjective: Patient pleasant, slightly confused. NG tube hold last night. Patient denies any emesis. Diet has been advanced by surgery.  Objective: Vital signs in last 24 hours: Temp:  [98.4 F (36.9 C)-99 F (37.2 C)] 98.4 F (36.9 C) (07/16 0604) Pulse Rate:  [68-73] 73 (07/16 0604) Resp:  [16-18] 17 (07/16 0604) BP: (123-130)/(56-63) 130/63 mmHg (07/16 0604) SpO2:  [96 %-100 %] 99 % (07/16 0604) Weight change:  Last BM Date: 05/25/14  Intake/Output from previous day: 07/15 0701 - 07/16 0700 In: 1658.8 [I.V.:1658.8] Out: 300 [Emesis/NG output:300] Intake/Output this shift:    General appearance: slightly confused Resp: clear to auscultation bilaterally Cardio: regular rate and rhythm, S1, S2 normal, no murmur, click, rub or gallop GI: soft, no organomegaly, decreased bowel sounds  Lab Results:  Results for orders placed during the hospital encounter of 05/25/14 (from the past 24 hour(s))  GLUCOSE, CAPILLARY     Status: Abnormal   Collection Time    05/26/14 12:09 PM      Result Value Ref Range   Glucose-Capillary 119 (*) 70 - 99 mg/dL   Comment 1 Documented in Chart     Comment 2 Notify RN    GLUCOSE, CAPILLARY     Status: Abnormal   Collection Time    05/26/14  6:47 PM      Result Value Ref Range   Glucose-Capillary 103 (*) 70 - 99 mg/dL   Comment 1 Documented in Chart     Comment 2 Notify RN    GLUCOSE, CAPILLARY     Status: Abnormal   Collection Time    05/26/14 11:53 PM      Result Value Ref Range   Glucose-Capillary 121 (*) 70 - 99 mg/dL   Comment 1 Notify RN    CBC     Status: Abnormal   Collection Time    05/27/14  4:24 AM      Result Value Ref Range   WBC 6.3  4.0 - 10.5 K/uL   RBC 3.41 (*) 3.87 - 5.11 MIL/uL   Hemoglobin 11.2 (*) 12.0 - 15.0 g/dL   HCT 32.9 (*) 36.0 - 46.0 %   MCV 96.5  78.0 - 100.0 fL   MCH 32.8  26.0 - 34.0 pg   MCHC 34.0  30.0 - 36.0 g/dL   RDW 13.2  11.5 - 15.5 %   Platelets 260  150 - 400 K/uL  BASIC METABOLIC PANEL      Status: Abnormal   Collection Time    05/27/14  4:24 AM      Result Value Ref Range   Sodium 143  137 - 147 mEq/L   Potassium 3.5 (*) 3.7 - 5.3 mEq/L   Chloride 108  96 - 112 mEq/L   CO2 26  19 - 32 mEq/L   Glucose, Bld 108 (*) 70 - 99 mg/dL   BUN 7  6 - 23 mg/dL   Creatinine, Ser 0.82  0.50 - 1.10 mg/dL   Calcium 8.4  8.4 - 10.5 mg/dL   GFR calc non Af Amer 67 (*) >90 mL/min   GFR calc Af Amer 78 (*) >90 mL/min   Anion gap 9  5 - 15  GLUCOSE, CAPILLARY     Status: None   Collection Time    05/27/14  6:02 AM      Result Value Ref Range   Glucose-Capillary 94  70 - 99 mg/dL   Comment 1 Notify RN        Studies/Results:  Dg Chest 2 View  05/26/2014   CLINICAL DATA:  Upper abdominal pain and nausea  EXAM: CHEST  2 VIEW  COMPARISON:  07/30/2013  FINDINGS: Stable positioning of a right IJ porta catheter, tip at the SVC level. Normal heart size and upper mediastinal contours for age. There is mild pulmonary hyperinflation. There is no edema, consolidation, effusion, or pneumothorax. Lumpectomy changes noted in the left breast.  IMPRESSION: No active cardiopulmonary disease.   Electronically Signed   By: Jorje Guild M.D.   On: 05/26/2014 01:46   Ct Abdomen Pelvis W Contrast  05/26/2014   CLINICAL DATA:  Abdominal pain  EXAM: CT ABDOMEN AND PELVIS WITH CONTRAST  TECHNIQUE: Multidetector CT imaging of the abdomen and pelvis was performed using the standard protocol following bolus administration of intravenous contrast.  CONTRAST:  100 cc low osmolar iodinated contrast.  COMPARISON:  11/07/2013  FINDINGS: BODY WALL: Unremarkable.  LOWER CHEST: Unremarkable.  ABDOMEN/PELVIS:  Liver: No focal abnormality.  Biliary: No evidence of biliary obstruction or stone.  Pancreas: Unremarkable.  Spleen: Unremarkable.  Adrenals: Unremarkable.  Kidneys and ureters: No hydronephrosis or stone.  Bladder: Unremarkable.  Reproductive: Hysterectomy and probable oophorectomy  Bowel: There is a cluster of ileal  loops in the pelvis which are thickened by submucosal edema. Proximal to the thickened loops are dilated, fluid-filled small bowel segments with congested mesentery and interloop fluid. Beyond the thickened loops, the bowel it is decompressed. The appendix is negative.  Retroperitoneum: No mass or adenopathy.  Peritoneum: Small volume ascites, considered reactive. There is no pneumoperitoneum.  Vascular: No acute abnormality. Mesenteric vessels to the thickened bowel loops are patent.  OSSEOUS: Advanced L4-5 and L5-S1 facet osteoarthritis with anterolisthesis.  IMPRESSION: High-grade distal small bowel obstruction. There is bowel edema at the level of the transition point which could be the cause or result of the obstruction.   Electronically Signed   By: Jorje Guild M.D.   On: 05/26/2014 04:21   Dg Abd Acute W/chest  05/27/2014   CLINICAL DATA:  Abdominal pain and constipation question perforation or bowel obstruction, history hypertension, sarcoma left breast, carcinosarcoma of uterus, adhesions  EXAM: ACUTE ABDOMEN SERIES (ABDOMEN 2 VIEW & CHEST 1 VIEW)  COMPARISON:  Chest radiograph 05/26/2014, abdominal radiographs 11/13/2013  FINDINGS: RIGHT jugular Port-A-Cath with tip projecting over SVC.  Enlargement of cardiac silhouette.  Mediastinal contours and pulmonary vascularity normal.  Subsegmental atelectasis at LEFT base.  No acute infiltrate, pleural effusion or pneumothorax.  Bones demineralized.  Retained contrast in colon from interval CT.  No bowel dilatation, bowel wall thickening or free intraperitoneal air.  No acute osseous findings or definite urinary tract calcifications.  IMPRESSION: Subsegmental atelectasis LEFT base.  Enlargement of cardiac silhouette.  No acute abdominal findings.   Electronically Signed   By: Lavonia Dana M.D.   On: 05/27/2014 09:04    Medications:  Prior to Admission:  Prescriptions prior to admission  Medication Sig Dispense Refill  . aspirin 81 MG chewable tablet  Chew 81 mg by mouth every morning.      Marland Kitchen atorvastatin (LIPITOR) 20 MG tablet Take 10-20 mg by mouth daily. Alternate 10 mg (1/2 tablet) with 20 mg (1 tablet ) every other day      . calcium-vitamin D (OSCAL WITH D) 500-200 MG-UNIT per tablet Take 2 tablets by mouth every morning.      . carboxymethylcellulose (REFRESH PLUS) 0.5 % SOLN Place 1 drop into both eyes 3 (three) times daily as needed (for irritated  eyes).      . gabapentin (NEURONTIN) 100 MG capsule Take 2 capsules (200 mg total) by mouth 3 (three) times daily.  180 capsule  6  . ibuprofen (ADVIL,MOTRIN) 200 MG tablet Take 400 mg by mouth every 6 (six) hours as needed for mild pain.      . Multiple Vitamin (MULTIVITAMIN WITH MINERALS) TABS Take 1 tablet by mouth daily.      . polyethylene glycol (MIRALAX / GLYCOLAX) packet Take 8 g by mouth daily.       . propranolol (INDERAL) 60 MG tablet Take 60 mg by mouth 2 (two) times daily.      . simethicone (GAS-X) 80 MG chewable tablet Chew 1 tablet (80 mg total) by mouth every 6 (six) hours as needed for flatulence.  30 tablet  0   Scheduled: . antiseptic oral rinse  15 mL Mouth Rinse q12n4p  . bisacodyl  10 mg Rectal Daily  . chlorhexidine  15 mL Mouth Rinse BID  . enoxaparin (LOVENOX) injection  40 mg Subcutaneous Q24H  . lip balm  1 application Topical BID  . metoprolol  2.5 mg Intravenous 4 times per day  . pantoprazole (PROTONIX) IV  40 mg Intravenous Q24H   Continuous:  TLX:[id number], alum & mag hydroxide-simeth, diphenhydrAMINE, fentaNYL, lactated ringers, magic mouthwash, menthol-cetylpyridinium, metoprolol, ondansetron (ZOFRAN) IV, ondansetron (ZOFRAN) IV, phenol, promethazine  Assessment/Plan: Principal Problem:  SBO (small bowel obstruction) - recurrent. Continue  conservative measures, patient's diet has been advanced, Await further input from surgery  Active Problems:  Carcinosarcoma of uterus s/p TAH BSO 2010  Hypercholesterolemia  HTN (hypertension)  Alzheimer's  disease with memory loss   LOS: 2 days   Atonya Templer D 05/27/2014, 10:21 AM

## 2014-05-28 LAB — GLUCOSE, CAPILLARY
Glucose-Capillary: 93 mg/dL (ref 70–99)
Glucose-Capillary: 97 mg/dL (ref 70–99)

## 2014-05-28 NOTE — Discharge Summary (Signed)
Physician Discharge Summary  Patient ID: Laura Perkins MRN: [id number] DOB/AGE: 1934-11-22 79 y.o.  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 Of note she has a history of recurrent small bowel obstruction. CT scan was consistent with small bowel obstruction. Due to patient having emesis in the emergency room, NG tube was placed. She was admitted to a medical floor bed for further evaluation and treatment. Please see dictated H&P for further details. Patient was seen in consultation by general surgery. She was made n.p.o. Provided IV fluids. Patient's NG tube came out, she remained without nausea vomiting or abdominal pain. Her diet was advanced, she had a bowel movement prior to discharge. Patient was felt to be back to her baseline level of function. She did have some mild confusion.  Consults: Treatment Team:  Md Edison Pace, MD  Significant Diagnostic Studies:Dg Chest 2 View  05/26/2014   CLINICAL DATA:  Upper abdominal pain and nausea  EXAM: CHEST  2 VIEW  COMPARISON:  07/30/2013  FINDINGS: Stable positioning of a right IJ porta catheter, tip at the SVC level. Normal heart size and upper mediastinal contours for age. There is mild pulmonary hyperinflation. There is no edema, consolidation, effusion, or pneumothorax. Lumpectomy changes noted in the left breast.  IMPRESSION: No active cardiopulmonary disease.   Electronically Signed   By: Jorje Guild M.D.   On: 05/26/2014 01:46   Ct Abdomen Pelvis W Contrast  05/26/2014   CLINICAL DATA:   Abdominal pain  EXAM: CT ABDOMEN AND PELVIS WITH CONTRAST  TECHNIQUE: Multidetector CT imaging of the abdomen and pelvis was performed using the standard protocol following bolus administration of intravenous contrast.  CONTRAST:  100 cc low osmolar iodinated contrast.  COMPARISON:  11/07/2013  FINDINGS: BODY WALL: Unremarkable.  LOWER CHEST: Unremarkable.  ABDOMEN/PELVIS:  Liver: No focal abnormality.  Biliary: No evidence of biliary obstruction or stone.  Pancreas: Unremarkable.  Spleen: Unremarkable.  Adrenals: Unremarkable.  Kidneys and ureters: No hydronephrosis or stone.  Bladder: Unremarkable.  Reproductive: Hysterectomy and probable oophorectomy  Bowel: There is a cluster of ileal loops in the pelvis which are thickened by submucosal edema. Proximal to the thickened loops are dilated, fluid-filled small bowel segments with congested mesentery and interloop fluid. Beyond the thickened loops, the bowel it is decompressed. The appendix is negative.  Retroperitoneum: No mass or adenopathy.  Peritoneum: Small volume ascites, considered reactive. There is no pneumoperitoneum.  Vascular: No acute abnormality. Mesenteric vessels to the thickened bowel loops are patent.  OSSEOUS: Advanced L4-5 and L5-S1 facet osteoarthritis with anterolisthesis.  IMPRESSION: High-grade distal small bowel obstruction. There is bowel edema at the level of the transition point which could be the cause or result of the obstruction.   Electronically Signed   By: Jorje Guild M.D.   On: 05/26/2014 04:21   Dg Abd Acute W/chest  05/27/2014   CLINICAL DATA:  Abdominal pain and constipation question perforation or bowel obstruction, history hypertension, sarcoma left breast, carcinosarcoma of uterus, adhesions  EXAM: ACUTE ABDOMEN SERIES (ABDOMEN 2 VIEW & CHEST 1 VIEW)  COMPARISON:  Chest radiograph 05/26/2014,  abdominal radiographs 11/13/2013  FINDINGS: RIGHT jugular Port-A-Cath with tip projecting over SVC.  Enlargement of cardiac  silhouette.  Mediastinal contours and pulmonary vascularity normal.  Subsegmental atelectasis at LEFT base.  No acute infiltrate, pleural effusion or pneumothorax.  Bones demineralized.  Retained contrast in colon from interval CT.  No bowel dilatation, bowel wall thickening or free intraperitoneal air.  No acute osseous findings or definite urinary tract calcifications.  IMPRESSION: Subsegmental atelectasis LEFT base.  Enlargement of cardiac silhouette.  No acute abdominal findings.   Electronically Signed   By: Lavonia Dana M.D.   On: 05/27/2014 09:04      Discharge Exam: Blood pressure 138/73, pulse 67, temperature 98.4 F (36.9 C), temperature source Oral, resp. rate 18, height 5' (1.524 m), weight 55.339 kg (122 lb), SpO2 98.00%. General appearance: fatigued Resp: clear to auscultation bilaterally Cardio: regular rate and rhythm, S1, S2 normal, no murmur, click, rub or gallop GI: decreased bowel sounds, no guarding no organomegaly  Disposition: 01-Home or Self Care     Medication List         aspirin 81 MG chewable tablet  Chew 81 mg by mouth every morning.     atorvastatin 20 MG tablet  Commonly known as:  LIPITOR  Take 10-20 mg by mouth daily. Alternate 10 mg (1/2 tablet) with 20 mg (1 tablet ) every other day     calcium-vitamin D 500-200 MG-UNIT per tablet  Commonly known as:  OSCAL WITH D  Take 2 tablets by mouth every morning.     carboxymethylcellulose 0.5 % Soln  Commonly known as:  REFRESH PLUS  Place 1 drop into both eyes 3 (three) times daily as needed (for irritated eyes).     gabapentin 100 MG capsule  Commonly known as:  NEURONTIN  Take 2 capsules (200 mg total) by mouth 3 (three) times daily.     ibuprofen 200 MG tablet  Commonly known as:  ADVIL,MOTRIN  Take 400 mg by mouth every 6 (six) hours as needed for mild pain.     multivitamin with minerals Tabs tablet  Take 1 tablet by mouth daily.     polyethylene glycol packet  Commonly known as:  MIRALAX  / GLYCOLAX  Take 8 g by mouth daily.     propranolol 60 MG tablet  Commonly known as:  INDERAL  Take 60 mg by mouth 2 (two) times daily.     simethicone 80 MG chewable tablet  Commonly known as:  GAS-X  Chew 1 tablet (80 mg total) by mouth every 6 (six) hours as needed for flatulence.           Follow-up Information   Follow up with Dusten Ellinwood D, MD In 1 week.   Specialty:  Internal Medicine   Contact information:   [address] [place] [address] [phone]       Signed: Kandice Hams 05/28/2014, 4:25 PM

## 2014-05-28 NOTE — Progress Notes (Signed)
Patient was stable at time of discharge. Reviewed discharge education with patient and her husband. They verbalized understanding and had no further questions.

## 2014-05-28 NOTE — Plan of Care (Signed)
Problem: Phase I Progression Outcomes Goal: Pain controlled with appropriate interventions Outcome: Completed/Met Date Met:  05/28/14 Denies pain in abdomen. No Nausea complaints neither.

## 2014-05-28 NOTE — Progress Notes (Signed)
Patient's IV had come out patient for possible discharge on 7/17-spoke to Dr Inda Merlin (on call for Dr Delfina Redwood). Gave order to change meds to oral and "Ok to leave IV out".

## 2014-05-28 NOTE — Plan of Care (Signed)
Problem: Phase I Progression Outcomes Goal: OOB as tolerated unless otherwise ordered Outcome: Completed/Met Date Met:  05/28/14 oob ambulating in room on own.

## 2014-05-28 NOTE — Progress Notes (Signed)
Physical Therapy Treatment Patient Details Name: RAEVYN SOKOL MRN: [id number] DOB: 1935/04/27 Today's Date: 05/28/2014    History of Present Illness JANVI AMMAR is a 79 y.o. female with history of hypertension, hyperlipidemia, uterine cancer status post surgery and radiation treatment who has had previous history of recurrent small bowel obstruction presents to the ER because of abdominal pain since yesterday. Pain is mostly periumbilical and constant. Patient has had bowel movement yesterday morning. After reaching ER patient had an episode of nausea and vomiting. CT abdomen and pelvis shows features consistent with small bowel obstruction    PT Comments    *Pt demonstrates improved balance with walking today, she walked 400' with supervision, no LOB. OK to DC home from PT standpoint.  PT goals met, DC from PT.**  Follow Up Recommendations  No PT follow up     Equipment Recommendations  None recommended by PT    Recommendations for Other Services       Precautions / Restrictions Precautions Precautions: Fall Restrictions Weight Bearing Restrictions: No    Mobility  Bed Mobility               General bed mobility comments: Pt up walking in hall with husband  Transfers                 General transfer comment: NT-pt up walking in hall   Ambulation/Gait Ambulation/Gait assistance: Supervision Ambulation Distance (Feet): 400 Feet Assistive device: None Gait Pattern/deviations: WFL(Within Functional Limits)   Gait velocity interpretation: at or above normal speed for age/gender General Gait Details: steady with walking, steady with head turns to side/up/down with walking, able to reach to floor in standing without LOB   Stairs            Wheelchair Mobility    Modified Rankin (Stroke Patients Only)       Balance                                    Cognition Arousal/Alertness: Awake/alert Behavior During Therapy: WFL  for tasks assessed/performed Overall Cognitive Status: History of cognitive impairments - at baseline       Memory: Decreased short-term memory              Exercises      General Comments        Pertinent Vitals/Pain *0/10 pain**    Home Living                      Prior Function            PT Goals (current goals can now be found in the care plan section) Acute Rehab PT Goals Patient Stated Goal: be able to get back home PT Goal Formulation: With patient/family Time For Goal Achievement: 06/09/14 Potential to Achieve Goals: Good Progress towards PT goals: Goals met/education completed, patient discharged from PT    Frequency  Min 3X/week    PT Plan Current plan remains appropriate    Co-evaluation             End of Session   Activity Tolerance: Patient tolerated treatment well Patient left: in chair;with call bell/phone within reach;with family/visitor present     Time: 5597-4163 PT Time Calculation (min): 8 min  Charges:  $Gait Training: 8-22 mins  G Codes:      Philomena Doheny 05/28/2014, 11:46 AM [phone]

## 2014-05-28 NOTE — Discharge Instructions (Signed)
Small Bowel Obstruction °A small bowel obstruction is a blockage (obstruction) of the small intestine (small bowel). The small bowel is a long, slender tube that connects the stomach to the colon. Its job is to absorb nutrients from the fluids and foods you consume into the bloodstream.  °CAUSES  °There are many causes of intestinal blockage. The most common ones include: °· Hernias. This is a more common cause in children than adults. °· Inflammatory bowel disease (enteritis and colitis). °· Twisting of the bowel (volvulus). °· Tumors. °· Scar tissue (adhesions) from previous surgery or radiation treatment. °· Recent surgery. This may cause an acute small bowel obstruction called an ileus. °SYMPTOMS  °· Abdominal pain. This may be dull cramps or sharp pain. It may occur in one area or may be present in the entire abdomen. Pain can range from mild to severe, depending on the degree of obstruction. °· Nausea and vomiting. Vomit may be greenish or yellow bile color. °· Distended or swollen stomach. Abdominal bloating is a common symptom. °· Constipation. °· Lack of passing gas. °· Frequent belching. °· Diarrhea. This may occur if runny stool is able to leak around the obstruction. °DIAGNOSIS  °Your caregiver can usually diagnose small bowel obstruction by taking a history, doing a physical exam, and taking X-rays. If the cause is unclear, a CT scan (computerized tomography) of your abdomen and pelvis may be needed. °TREATMENT  °Treatment of the blockage depends on the cause and how bad the problem is.  °· Sometimes, the obstruction improves with bed rest and intravenous (IV) fluids. °· Resting the bowel is very important. This means following a simple diet. Sometimes, a clear liquid diet may be required for several days. °· Sometimes, a small tube (nasogastric tube) is placed into the stomach to decompress the bowel. When the bowel is blocked, it usually swells up like a balloon filled with air and fluids.  Decompression means that the air and fluids are removed by suction through that tube. This can help with pain, discomfort, and nausea. It can also help the obstruction resolve faster. °· Surgery may be required if other treatments do not work. Bowel obstruction from a hernia may require early surgery and can be an emergency procedure. Adhesions that cause frequent or severe obstructions may also require surgery. °HOME CARE INSTRUCTIONS °If your bowel obstruction is only partial or incomplete, you may be allowed to go home. °· Get plenty of rest. °· Follow your diet as directed by your caregiver. °· Only consume clear liquids until your condition improves. °· Avoid solid foods as instructed. °SEEK IMMEDIATE MEDICAL CARE IF: °· You have increased pain or cramping. °· You vomit blood. °· You have uncontrolled vomiting or nausea. °· You cannot drink fluids due to vomiting or pain. °· You develop confusion. °· You begin feeling very dry or thirsty (dehydrated). °· You have severe bloating. °· You have chills. °· You have a fever. °· You feel extremely weak or you faint. °MAKE SURE YOU: °· Understand these instructions. °· Will watch your condition. °· Will get help right away if you are not doing well or get worse. °Document Released: 01/15/2006 Document Revised: 01/21/2012 Document Reviewed: 01/12/2011 °ExitCare® Patient Information ©2015 [employer]. This information is not intended to replace advice given to you by your health care provider. Make sure you discuss any questions you have with your health care provider. ° °

## 2014-05-28 NOTE — Progress Notes (Signed)
Subjective: Patient appears a little confused otherwise no nausea no vomiting, tolerating liquid diet.  Objective: Vital signs in last 24 hours: Temp:  [98.1 F (36.7 C)-98.5 F (36.9 C)] 98.4 F (36.9 C) (07/17 0607) Pulse Rate:  [55-81] 67 (07/17 0607) Resp:  [16-18] 18 (07/17 0607) BP: (130-139)/(73-79) 138/73 mmHg (07/17 0607) SpO2:  [98 %-99 %] 98 % (07/17 0607) Weight change:  Last BM Date: 05/27/14  Intake/Output from previous day: 07/16 0701 - 07/17 0700 In: 460 [P.O.:460] Out: -  Intake/Output this shift:    General appearance: fatigued Resp: clear to auscultation bilaterally Cardio: regular rate and rhythm, S1, S2 normal, no murmur, click, rub or gallop GI: decreased bowel sounds bilateral  Lab Results:  Results for orders placed during the hospital encounter of 05/25/14 (from the past 24 hour(s))  GLUCOSE, CAPILLARY     Status: None   Collection Time    05/27/14 12:06 PM      Result Value Ref Range   Glucose-Capillary 90  70 - 99 mg/dL   Comment 1 Documented in Chart     Comment 2 Notify RN    GLUCOSE, CAPILLARY     Status: Abnormal   Collection Time    05/27/14  6:21 PM      Result Value Ref Range   Glucose-Capillary 100 (*) 70 - 99 mg/dL   Comment 1 Documented in Chart     Comment 2 Notify RN    GLUCOSE, CAPILLARY     Status: None   Collection Time    05/27/14  9:38 PM      Result Value Ref Range   Glucose-Capillary 97  70 - 99 mg/dL   Comment 1 Notify RN    GLUCOSE, CAPILLARY     Status: None   Collection Time    05/27/14 11:50 PM      Result Value Ref Range   Glucose-Capillary 89  70 - 99 mg/dL   Comment 1 Notify RN    GLUCOSE, CAPILLARY     Status: None   Collection Time    05/28/14  6:04 AM      Result Value Ref Range   Glucose-Capillary 93  70 - 99 mg/dL   Comment 1 Notify RN        Studies/Results: Dg Abd Acute W/chest  05/27/2014   CLINICAL DATA:  Abdominal pain and constipation question perforation or bowel obstruction, history  hypertension, sarcoma left breast, carcinosarcoma of uterus, adhesions  EXAM: ACUTE ABDOMEN SERIES (ABDOMEN 2 VIEW & CHEST 1 VIEW)  COMPARISON:  Chest radiograph 05/26/2014, abdominal radiographs 11/13/2013  FINDINGS: RIGHT jugular Port-A-Cath with tip projecting over SVC.  Enlargement of cardiac silhouette.  Mediastinal contours and pulmonary vascularity normal.  Subsegmental atelectasis at LEFT base.  No acute infiltrate, pleural effusion or pneumothorax.  Bones demineralized.  Retained contrast in colon from interval CT.  No bowel dilatation, bowel wall thickening or free intraperitoneal air.  No acute osseous findings or definite urinary tract calcifications.  IMPRESSION: Subsegmental atelectasis LEFT base.  Enlargement of cardiac silhouette.  No acute abdominal findings.   Electronically Signed   By: Lavonia Dana M.D.   On: 05/27/2014 09:04    Medications:  I have reviewed the patient's current medications. Prior to Admission:  Prescriptions prior to admission  Medication Sig Dispense Refill  . aspirin 81 MG chewable tablet Chew 81 mg by mouth every morning.      Marland Kitchen atorvastatin (LIPITOR) 20 MG tablet Take 10-20 mg by mouth daily. Alternate  10 mg (1/2 tablet) with 20 mg (1 tablet ) every other day      . calcium-vitamin D (OSCAL WITH D) 500-200 MG-UNIT per tablet Take 2 tablets by mouth every morning.      . carboxymethylcellulose (REFRESH PLUS) 0.5 % SOLN Place 1 drop into both eyes 3 (three) times daily as needed (for irritated eyes).      . gabapentin (NEURONTIN) 100 MG capsule Take 2 capsules (200 mg total) by mouth 3 (three) times daily.  180 capsule  6  . ibuprofen (ADVIL,MOTRIN) 200 MG tablet Take 400 mg by mouth every 6 (six) hours as needed for mild pain.      . Multiple Vitamin (MULTIVITAMIN WITH MINERALS) TABS Take 1 tablet by mouth daily.      . polyethylene glycol (MIRALAX / GLYCOLAX) packet Take 8 g by mouth daily.       . propranolol (INDERAL) 60 MG tablet Take 60 mg by mouth 2  (two) times daily.      . simethicone (GAS-X) 80 MG chewable tablet Chew 1 tablet (80 mg total) by mouth every 6 (six) hours as needed for flatulence.  30 tablet  0   Scheduled: . antiseptic oral rinse  15 mL Mouth Rinse q12n4p  . bisacodyl  10 mg Rectal Daily  . chlorhexidine  15 mL Mouth Rinse BID  . enoxaparin (LOVENOX) injection  40 mg Subcutaneous Q24H  . lip balm  1 application Topical BID  . metoprolol tartrate  25 mg Oral BID  . pantoprazole  40 mg Oral Daily   Continuous:  MKL:KJZPHXTAVWPVX, alum & mag hydroxide-simeth, diphenhydrAMINE, fentaNYL, magic mouthwash, menthol-cetylpyridinium, metoprolol, ondansetron (ZOFRAN) IV, ondansetron (ZOFRAN) IV, phenol, promethazine  Assessment/Plan: Principal Problem:  SBO (small bowel obstruction) - recurrent clinically resolving.she appears to be tolerating liquid diet, Disposition to home when cleared by surgery Mild hypokalemia Active Problems:  Carcinosarcoma of uterus s/p TAH BSO 2010  Hypercholesterolemia  HTN (hypertension)  Alzheimer's disease with memory loss   LOS: 3 days   Bijou Easler D 05/28/2014, 8:27 AM

## 2014-05-28 NOTE — Progress Notes (Signed)
Patient interviewed and examined, agree with PA note above. Obstruction appears resolved. Will sign off  Edward Jolly MD, [facility]  05/28/2014 5:33 PM

## 2014-05-28 NOTE — Progress Notes (Signed)
[facility] Progress Note     Subjective: Pt doing well, pain/distension and N/V resolved.  Having good flatus, not BM yet.  2nd suppository in currently.  Wants to eat regular food.  Less confused today.  Tolerated clears.  Objective: Vital signs in last 24 hours: Temp:  [98.1 F (36.7 C)-98.5 F (36.9 C)] 98.4 F (36.9 C) (07/17 0607) Pulse Rate:  [55-81] 67 (07/17 0607) Resp:  [16-18] 18 (07/17 0607) BP: (130-139)/(73-79) 138/73 mmHg (07/17 0607) SpO2:  [98 %-99 %] 98 % (07/17 0607) Last BM Date: 05/27/14 (per patient )  Intake/Output from previous day: 07/16 0701 - 07/17 0700 In: 460 [P.O.:460] Out: -  Intake/Output this shift:    PE: Gen: Alert, NAD, pleasant  Abd: Soft, NT/ND, +BS, no HSM   Lab Results:   Recent Labs  05/26/14 0034 05/26/14 0248 05/27/14 0424  WBC 10.6*  --  6.3  HGB 13.5 14.6 11.2*  HCT 40.3 43.0 32.9*  PLT 304  --  260   BMET  Recent Labs  05/26/14 0248 05/27/14 0424  NA 139 143  K 4.1 3.5*  CL 105 108  CO2  --  26  GLUCOSE 119* 108*  BUN 17 7  CREATININE 0.80 0.82  CALCIUM  --  8.4   PT/INR No results found for this basename: LABPROT, INR,  in the last 72 hours CMP     Component Value Date/Time   NA 143 05/27/2014 0424   K 3.5* 05/27/2014 0424   CL 108 05/27/2014 0424   CO2 26 05/27/2014 0424   GLUCOSE 108* 05/27/2014 0424   BUN 7 05/27/2014 0424   CREATININE 0.82 05/27/2014 0424   CALCIUM 8.4 05/27/2014 0424   PROT 6.9 05/26/2014 0034   ALBUMIN 4.0 05/26/2014 0034   AST 21 05/26/2014 0034   ALT 14 05/26/2014 0034   ALKPHOS 76 05/26/2014 0034   BILITOT 0.6 05/26/2014 0034   GFRNONAA 67* 05/27/2014 0424   GFRAA 78* 05/27/2014 0424   Lipase     Component Value Date/Time   LIPASE 61* 05/26/2014 0034       Studies/Results: Dg Abd Acute W/chest  05/27/2014   CLINICAL DATA:  Abdominal pain and constipation question perforation or bowel obstruction, history hypertension, sarcoma left breast, carcinosarcoma of  uterus, adhesions  EXAM: ACUTE ABDOMEN SERIES (ABDOMEN 2 VIEW & CHEST 1 VIEW)  COMPARISON:  Chest radiograph 05/26/2014, abdominal radiographs 11/13/2013  FINDINGS: RIGHT jugular Port-A-Cath with tip projecting over SVC.  Enlargement of cardiac silhouette.  Mediastinal contours and pulmonary vascularity normal.  Subsegmental atelectasis at LEFT base.  No acute infiltrate, pleural effusion or pneumothorax.  Bones demineralized.  Retained contrast in colon from interval CT.  No bowel dilatation, bowel wall thickening or free intraperitoneal air.  No acute osseous findings or definite urinary tract calcifications.  IMPRESSION: Subsegmental atelectasis LEFT base.  Enlargement of cardiac silhouette.  No acute abdominal findings.   Electronically Signed   By: Lavonia Dana M.D.   On: 05/27/2014 09:04    Anti-infectives: Anti-infectives   None       Assessment/Plan Recurrent SBO, 3rd time, since 2013; with prior hysterectomy/radiation rx for uterine cancer.  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 Conservative mgt appears to be working well. NG now accidentally out. Tolerating clears and wants regular food.  Having flatus, no BM yet - getting suppository 2. Ambulate and IS  3. SCD's and lovenox  4. If tolerates solid food can go home today, will sign off     LOS: 3 days    Perkins, Laura Elsen 05/28/2014, 7:58 AM Pager: [phone]

## 2014-10-11 ENCOUNTER — Encounter: Payer: Self-pay | Admitting: Neurology

## 2014-10-11 ENCOUNTER — Ambulatory Visit (INDEPENDENT_AMBULATORY_CARE_PROVIDER_SITE_OTHER): Payer: Commercial Managed Care - HMO | Admitting: Neurology

## 2014-10-11 VITALS — BP 138/85 | HR 64 | Ht 61.0 in | Wt 122.0 lb

## 2014-10-11 DIAGNOSIS — R413 Other amnesia: Secondary | ICD-10-CM

## 2014-10-11 MED ORDER — DONEPEZIL HCL 10 MG PO TABS: 10.0000 mg | ORAL_TABLET | Freq: Every day | ORAL | Status: DC

## 2014-10-11 MED ORDER — RIVASTIGMINE 4.6 MG/24HR TD PT24: 4.6000 mg | MEDICATED_PATCH | Freq: Every day | TRANSDERMAL | Status: DC

## 2014-10-11 MED ORDER — GABAPENTIN 100 MG PO CAPS: 200.0000 mg | ORAL_CAPSULE | Freq: Four times a day (QID) | ORAL | Status: DC

## 2014-10-11 NOTE — Progress Notes (Signed)
[facility]  PATIENT: GIANNY KILLMAN DOB: 10-09-1935   REASON FOR VISIT: Followup for memory loss    HISTORY OF PRESENT ILLNESS: Ms. Karapetyan, is a 79 year old female, accompanied by her husband to follow-up for memory loss.   She has past history of left breast cancer in 1990s, requiring partial mastectomy and lumpectomy.   She developed clinical stage II uterine carcinosarcoma diagnosed at the time of vaginal hysterectomy and BSO for pelvic prolapse 2010 and underwent adjuvant treatments of therapy withTaxol and carboplatin chemotherapy beginning October 2010, external beam radiation, and then repeat Taxol and carboplatin chemotherapy, finishing 02/27/2010.   She has noted memory problems since October 2010, unable to complete a sentence. She has difficulty with short-term memory. She "cannot remember what she did yesterday" She feels "foggy". She enjoys reading and but has trouble remembering what she was reading  Her brother has memory loss and Parkinson's disease, She denies visual loss, double vision, swallowing problems, slurred speech, blackout spells,or seizures.   MRI of the brain without contrast 03/31/2010 showed generalized atrophy and small vessel disease.   Labs B12 level, RPR,TSH, and Methylmalonic acid were normal.  doppler of the carotids 03/28/2010 was normal.   She was last seen in this office 09/08/2013.   MRI of the brain in 2011 showed generalized atrophy and small vessel disease.   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 She is taking gabapentin for bilateral feet paresthesia, which has been helpful  She has had side effects to Aricept in the past with nightmares. She had dizziness on Namenda. She is currently taking a baby aspirin and exercising regularly  She continue complains mild worsening memory trouble, no longer driving  REVIEW OF SYSTEMS: Full 14 system review of systems performed and notable only for  those listed, all others are neg:  Memory loss, numbness   ALLERGIES: Allergies  Allergen Reactions  . Sulfa Antibiotics Anaphylaxis    HOME MEDICATIONS: Outpatient Prescriptions Prior to Visit  Medication Sig Dispense Refill  . aspirin 81 MG chewable tablet Chew 81 mg by mouth every morning.    Marland Kitchen atorvastatin (LIPITOR) 20 MG tablet Take 10-20 mg by mouth daily. Alternate 10 mg (1/2 tablet) with 20 mg (1 tablet ) every other day    . calcium-vitamin D (OSCAL WITH D) 500-200 MG-UNIT per tablet Take 2 tablets by mouth as needed.     . carboxymethylcellulose (REFRESH PLUS) 0.5 % SOLN Place 1 drop into both eyes 3 (three) times daily as needed (for irritated eyes).    . gabapentin (NEURONTIN) 100 MG capsule Take 2 capsules (200 mg total) by mouth 3 (three) times daily. 180 capsule 6  . ibuprofen (ADVIL,MOTRIN) 200 MG tablet Take 400 mg by mouth every 6 (six) hours as needed for mild pain.    . Multiple Vitamin (MULTIVITAMIN WITH MINERALS) TABS Take 1 tablet by mouth daily.    . polyethylene glycol (MIRALAX / GLYCOLAX) packet Take 8 g by mouth daily.     . propranolol (INDERAL) 60 MG tablet Take 60 mg by mouth 2 (two) times daily.    . simethicone (GAS-X) 80 MG chewable tablet Chew 1 tablet (80 mg total) by mouth every 6 (six) hours as needed for flatulence. 30 tablet 0   No facility-administered medications prior to visit.    PAST MEDICAL HISTORY: Past Medical History  Diagnosis Date  . Hypertension   . Overactive bladder   . Sarcoma of central portion of left female  breast   . High cholesterol   . Ileus   . Abdominal adhesions   . Carcinosarcoma of uterus s/p TAH BSO 2010 11/22/2012    REPORT OF SURGICAL PATHOLOGY  Case #: [id number] Patient Name: AUNISTY, REALI Office Chart Number: N/A  MRN: [id number] Pathologist: Vonna Kotyk B. Lyndon Code, MD DOB/Age 07-11-1935 (Age: 49) Gender: F Date Taken: 07/28/2009 Date Received: 07/28/2009  FINAL DIAGNOSIS  MICROSCOPIC EXAMINATION AND DIAGNOSIS  UTERUS,  OVARIES AND FALLOPIAN TUBES, HYSTERECTOMY AND BILATERAL SALPINGO-OOPHORECTOMY: - CARCINOSARCOMA, ARISING FROM THE LOWER UTERINE SEGMENT, SPANNING 4.5 CM. - CARCINOSARCOMA IS 0.1 CM TO THE DEEP SOFT TISSUE RESECTION MARGIN. - CERVIX: ATROPHY AND CHRONIC INFLAMMATION. - ENDOMETRIUM: ATROPHIC APPEARING ENDOMETRIUM. - MYOMETRIUM: LEIOMYOMATA. - SEROSA: ESSENTIALLY UNREMARKABLE. - ADNEXA: PARATUBAL CYST  COMMENT Immunohistochemical stains were performed on the malignant neoplasm with the following results. The controls stained appropriately.  P16 Positive Estrogen receptor Scattered positive cells Progesterone receptor Scattered positive cells Cytokeratin AE1/AE3 Positive in epithe  . 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 Alzheimer's disease with memory loss 06/25/2013    PAST SURGICAL HISTORY: Past Surgical History  Procedure Laterality Date  . Abdominal hysterectomy  2010    Dr Christophe Louis  . Mastectomy partial / lumpectomy Left 1990    Breast sarcoma  . Ankle surgery    . Hernia repair      as a child    FAMILY HISTORY: Family History  Problem Relation Age of Onset  . Cancer Father     pancreatic     SOCIAL HISTORY: History   Social History  . Marital Status: Married    Spouse Name: Eddie Dibbles     Number of Children: 1  . Years of Education: HS    Occupational History  . retired     Social History Main Topics  . Smoking status: Never Smoker   . Smokeless tobacco: Never Used  . Alcohol Use: No  . Drug Use: No  . Sexual Activity: No     Comment: married   Other Topics Concern  . Not on file   Social History Narrative   Patient lives at home with home Eddie Dibbles her husband.    Patient has 1 child.    Patient is retired.    Patient has a high school education.    Patient is right handed     PHYSICAL EXAM  Filed Vitals:   10/11/14 1124  BP: 138/85  Pulse: 64  Height: 5\' 1"  (1.549 m)  Weight: 122 lb (55.339 kg)   Body mass index is  23.06 kg/(m^2).  Generalized: Well developed, in no acute distress  Head: normocephalic and atraumatic,. Oropharynx benign  Neck: Supple, no carotid bruits  Cardiac: Regular rate rhythm, no murmur  Musculoskeletal: No deformity   Neurological examination   Mentation: Alert oriented to time, place, history taking. MMSE 29/30 missing one item in recall. AFT 15. Follows all commands speech and language fluent  Cranial nerve II-XII: Pupils were equal round reactive to light extraocular movements were full, visual field were full on confrontational test. Facial sensation and strength were normal. hearing was intact to finger rubbing bilaterally. Uvula tongue midline. head turning and shoulder shrug were normal and symmetric.Tongue protrusion into cheek strength was normal. Motor: normal bulk and tone, full strength in the BUE, BLE, fine finger movements normal, no pronator drift. No focal weakness, bilateral hammertoes  Sensory: normal and symmetric to light touch, pinprick,  decreased to  vibration  in lower extremities  Coordination: finger-nose-finger, heel-to-shin bilaterally, no dysmetria Reflexes: Brachioradialis 2/2, biceps 2/2, triceps 2/2, patellar 2/2, Achilles 2/2, plantar responses were flexor bilaterally. Gait and Station: Rising up from seated position without assistance, normal stance,  moderate stride, good arm swing, smooth turning, Tandem gait is  mildly unsteady.   DIAGNOSTIC DATA (LABS, IMAGING, TESTING) - I reviewed patient records, labs, notes, testing and imaging myself where available.  Lab Results  Component Value Date   WBC 6.3 05/27/2014   HGB 11.2* 05/27/2014   HCT 32.9* 05/27/2014   MCV 96.5 05/27/2014   PLT 260 05/27/2014      Component Value Date/Time   NA 143 05/27/2014 0424   K 3.5* 05/27/2014 0424   CL 108 05/27/2014 0424   CO2 26 05/27/2014 0424   GLUCOSE 108* 05/27/2014 0424   BUN 7 05/27/2014 0424   CREATININE 0.82 05/27/2014 0424   CALCIUM 8.4  05/27/2014 0424   PROT 6.9 05/26/2014 0034   ALBUMIN 4.0 05/26/2014 0034   AST 21 05/26/2014 0034   ALT 14 05/26/2014 0034   ALKPHOS 76 05/26/2014 0034   BILITOT 0.6 05/26/2014 0034   GFRNONAA 67* 05/27/2014 0424   GFRAA 36* 05/27/2014 0424       ASSESSMENT AND PLAN  80 y.o. year old female  has a past medical history of gradual onset of memory problems consistent with mild cognitive impairment. Memory score is stable. Differential diagnosis includes age-related chemotherapy radiation induced versus early central nervous system degenerative disorder. She has had side effects to Aricept and Namenda in the past. She also has a pins and  needle sensation in her feet and lower extremities and is currently on gabapentin with relief of symptoms.  Continue baby aspirin daily Continue low-dose gabapentin will renew Continue exercise program Followup in 3 months   Marcial Pacas, M.D. Ph.D.  [facility] [address], [address] [place], [place] [address] [phone]

## 2014-10-13 ENCOUNTER — Telehealth: Payer: Self-pay | Admitting: Neurology

## 2014-10-13 NOTE — Telephone Encounter (Signed)
Pharmacist Truddie Crumble with [facility] @ [phone], stated Insurance will not pay for Rx rivastigmine (EXELON) 4.6 mg/24hr.  Patient questioning if there's an alternative medication.  Please call and advise.

## 2014-10-13 NOTE — Telephone Encounter (Signed)
I called the pharmacy back.  Laura Perkins said he misunderstood the note.  Exelon Patch is covered, however, the co-pay is $295 per month, which the patient is not able to afford.  Unfortunately, since this medication is now available in generic, the assistance program has been discontinued.  Patient is requesting a drug change.  Please advise.  Thank you.

## 2014-10-13 NOTE — Telephone Encounter (Signed)
I have called patient, she had bad reaction to Aricept in the past, could not afford Exelon patch, USD300 each month, will cancel Exelon order, continue moderate exercise

## 2014-12-31 DIAGNOSIS — Z1211 Encounter for screening for malignant neoplasm of colon: Secondary | ICD-10-CM | POA: Diagnosis not present

## 2015-01-10 ENCOUNTER — Encounter: Payer: Self-pay | Admitting: Neurology

## 2015-01-10 ENCOUNTER — Ambulatory Visit (INDEPENDENT_AMBULATORY_CARE_PROVIDER_SITE_OTHER): Payer: Commercial Managed Care - HMO | Admitting: Neurology

## 2015-01-10 VITALS — BP 124/72 | HR 67 | Ht 61.0 in | Wt 124.0 lb

## 2015-01-10 DIAGNOSIS — R413 Other amnesia: Secondary | ICD-10-CM | POA: Diagnosis not present

## 2015-01-10 DIAGNOSIS — R209 Unspecified disturbances of skin sensation: Secondary | ICD-10-CM

## 2015-01-10 NOTE — Progress Notes (Signed)
[facility]  PATIENT: Laura Perkins DOB: May 20, 1935   REASON FOR VISIT: Followup for memory loss  HISTORY OF PRESENT ILLNESS: Laura Perkins, is a 80 year old female, accompanied by her husband to follow-up for memory loss.   She has past history of left breast cancer in 1990s, requiring partial mastectomy and lumpectomy.   She developed clinical stage II uterine carcinosarcoma diagnosed at the time of vaginal hysterectomy and BSO for pelvic prolapse 2010 and underwent adjuvant treatments of therapy withTaxol and carboplatin chemotherapy beginning October 2010, external beam radiation, and then repeat Taxol and carboplatin chemotherapy, finishing 02/27/2010.   She has noted memory problems since October 2010, unable to complete a sentence. She has difficulty with short-term memory. She "cannot remember what she did yesterday" She feels "foggy". She enjoys reading and but has trouble remembering what she was reading  Her brother has memory loss and Parkinson's disease, She denies visual loss, double vision, swallowing problems, slurred speech, blackout spells,or seizures.   MRI of the brain without contrast 03/31/2010 showed generalized atrophy and small vessel disease.   Labs B12 level, RPR,TSH, and Methylmalonic acid were normal. doppler of the carotids 03/28/2010 was normal.   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 She is taking gabapentin for bilateral feet paresthesia, which has been helpful  She has had side effects to Aricept in the past with nightmares. She had dizziness on Namenda. She is currently taking a baby aspirin and exercising regularly, high copy of Exelon patch USD300 each month, she is not taking any medication for her memory trouble.  She continue complains mild worsening memory trouble, no longer driving  UPDATE Feb 80LF 2016: She is doing well stable, her mother passed away at age 40, she exercise regularly, MMSE is  29/30 today, animal naming 15.  She is taking Gabapentin 100mg  ii qid, for her neuropathy, she compains of bialteral feet burning sensation, especially at nighttime,   REVIEW OF SYSTEMS: Full 14 system review of systems performed and notable only for those listed, all others are neg:  Memory loss, numbness, cold intolerance   ALLERGIES: Allergies  Allergen Reactions  . Sulfa Antibiotics Anaphylaxis    HOME MEDICATIONS: Outpatient Prescriptions Prior to Visit  Medication Sig Dispense Refill  . aspirin 81 MG chewable tablet Chew 81 mg by mouth every morning.    Marland Kitchen atorvastatin (LIPITOR) 20 MG tablet Take 10-20 mg by mouth daily. Alternate 10 mg (1/2 tablet) with 20 mg (1 tablet ) every other day    . calcium-vitamin D (OSCAL WITH D) 500-200 MG-UNIT per tablet Take 2 tablets by mouth as needed.     . carboxymethylcellulose (REFRESH PLUS) 0.5 % SOLN Place 1 drop into both eyes 3 (three) times daily as needed (for irritated eyes).    . gabapentin (NEURONTIN) 100 MG capsule Take 2 capsules (200 mg total) by mouth 4 (four) times daily. 240 capsule 11  . ibuprofen (ADVIL,MOTRIN) 200 MG tablet Take 400 mg by mouth every 6 (six) hours as needed for mild pain.    . Multiple Vitamin (MULTIVITAMIN WITH MINERALS) TABS Take 1 tablet by mouth daily.    . polyethylene glycol (MIRALAX / GLYCOLAX) packet Take 8 g by mouth daily.     . propranolol (INDERAL) 60 MG tablet Take 60 mg by mouth 2 (two) times daily.    . simethicone (GAS-X) 80 MG chewable tablet Chew 1 tablet (80 mg total) by mouth every 6 (six) hours as needed for flatulence.  30 tablet 0  . donepezil (ARICEPT) 10 MG tablet Take 1 tablet (10 mg total) by mouth at bedtime. 30 tablet 11   No facility-administered medications prior to visit.    PAST MEDICAL HISTORY: Past Medical History  Diagnosis Date  . Hypertension   . Overactive bladder   . Sarcoma of central portion of left female breast   . High cholesterol   . Ileus   . Abdominal  adhesions   . Carcinosarcoma of uterus s/p TAH BSO 2010 11/22/2012    REPORT OF SURGICAL PATHOLOGY  Case #: [id number] Patient Name: Laura Perkins, Laura Perkins Office Chart Number: N/A  MRN: [id number] Pathologist: Vonna Kotyk B. Lyndon Code, MD DOB/Age 03-Jan-1935 (Age: 71) Gender: F Date Taken: 07/28/2009 Date Received: 07/28/2009  FINAL DIAGNOSIS  MICROSCOPIC EXAMINATION AND DIAGNOSIS  UTERUS, OVARIES AND FALLOPIAN TUBES, HYSTERECTOMY AND BILATERAL SALPINGO-OOPHORECTOMY: - CARCINOSARCOMA, ARISING FROM THE LOWER UTERINE SEGMENT, SPANNING 4.5 CM. - CARCINOSARCOMA IS 0.1 CM TO THE DEEP SOFT TISSUE RESECTION MARGIN. - CERVIX: ATROPHY AND CHRONIC INFLAMMATION. - ENDOMETRIUM: ATROPHIC APPEARING ENDOMETRIUM. - MYOMETRIUM: LEIOMYOMATA. - SEROSA: ESSENTIALLY UNREMARKABLE. - ADNEXA: PARATUBAL CYST  COMMENT Immunohistochemical stains were performed on the malignant neoplasm with the following results. The controls stained appropriately.  P16 Positive Estrogen receptor Scattered positive cells Progesterone receptor Scattered positive cells Cytokeratin AE1/AE3 Positive in epithe  . 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 Alzheimer's disease with memory loss 06/25/2013    PAST SURGICAL HISTORY: Past Surgical History  Procedure Laterality Date  . Abdominal hysterectomy  2010    Dr Christophe Louis  . Mastectomy partial / lumpectomy Left 1990    Breast sarcoma  . Ankle surgery    . Hernia repair      as a child    FAMILY HISTORY: Family History  Problem Relation Age of Onset  . Cancer Father     pancreatic     SOCIAL HISTORY: History   Social History  . Marital Status: Married    Spouse Name: Eddie Dibbles   . Number of Children: 1  . Years of Education: HS    Occupational History  . retired     Social History Main Topics  . Smoking status: Never Smoker   . Smokeless tobacco: Never Used  . Alcohol Use: No  . Drug Use: No  . Sexual Activity: No     Comment: married   Other Topics Concern    . Not on file   Social History Narrative   Patient lives at home with home Eddie Dibbles her husband.    Patient has 1 child.    Patient is retired.    Patient has a high school education.    Patient is right handed     PHYSICAL EXAM  Filed Vitals:   01/10/15 1255  BP: 124/72  Pulse: 67  Height: 5\' 1"  (1.549 m)  Weight: 124 lb (56.246 kg)   Body mass index is 23.44 kg/(m^2).  PHYSICAL EXAMNIATION:  Gen: NAD, conversant, well nourised, obese, well groomed                     Cardiovascular: Regular rate rhythm, no peripheral edema, warm, nontender. Eyes: Conjunctivae clear without exudates or hemorrhage Neck: Supple, no carotid bruise. Pulmonary: Clear to auscultation bilaterally   NEUROLOGICAL EXAM:  MENTAL STATUS: Speech:    Speech is normal; fluent and spontaneous with normal comprehension.  Cognition: MMSE 29/30, missed 1/3 recall    The patient is oriented to person, place,  and time;     recent and remote memory intact;     language fluent;     normal attention, concentration,     fund of knowledge.  CRANIAL NERVES: CN II: Visual fields are full to confrontation. Fundoscopic exam is normal with sharp discs and no vascular changes. Venous pulsations are present bilaterally. Pupils are 4 mm and briskly reactive to light. Visual acuity is 20/20 bilaterally. CN III, IV, VI: extraocular movement are normal. No ptosis. CN V: Facial sensation is intact to pinprick in all 3 divisions bilaterally. Corneal responses are intact.  CN VII: Face is symmetric with normal eye closure and smile. CN VIII: Hearing is normal to rubbing fingers CN IX, X: Palate elevates symmetrically. Phonation is normal. CN XI: Head turning and shoulder shrug are intact CN XII: Tongue is midline with normal movements and no atrophy.  MOTOR: There is no pronator drift of out-stretched arms. Muscle bulk and tone are normal. Muscle strength is normal.   Shoulder abduction Shoulder external rotation Elbow  flexion Elbow extension Wrist flexion Wrist extension Finger abduction Hip flexion Knee flexion Knee extension Ankle dorsi flexion Ankle plantar flexion  R 5 5 5 5 5 5 5 5 5 5 5 5   L 5 5 5 5 5 5 5 5 5 5 5 5     REFLEXES: Reflexes are 2+ and symmetric at the biceps, triceps, knees, and ankles. Plantar responses are flexor.  SENSORY: Light touch, pinprick, position sense, and vibration sense are intact in fingers and toes.  COORDINATION: Rapid alternating movements and fine finger movements are intact. There is no dysmetria on finger-to-nose and heel-knee-shin. There are no abnormal or extraneous movements.   GAIT/STANCE: Posture is normal. Gait is steady with normal steps, base, arm swing, and turning. Heel and toe walking are normal. Tandem gait is normal.  Romberg is absent.  DIAGNOSTIC DATA (LABS, IMAGING, TESTING) - I reviewed patient records, labs, notes, testing and imaging myself where available.  Lab Results  Component Value Date   WBC 6.3 05/27/2014   HGB 11.2* 05/27/2014   HCT 32.9* 05/27/2014   MCV 96.5 05/27/2014   PLT 260 05/27/2014      Component Value Date/Time   NA 143 05/27/2014 0424   K 3.5* 05/27/2014 0424   CL 108 05/27/2014 0424   CO2 26 05/27/2014 0424   GLUCOSE 108* 05/27/2014 0424   BUN 7 05/27/2014 0424   CREATININE 0.82 05/27/2014 0424   CALCIUM 8.4 05/27/2014 0424   PROT 6.9 05/26/2014 0034   ALBUMIN 4.0 05/26/2014 0034   AST 21 05/26/2014 0034   ALT 14 05/26/2014 0034   ALKPHOS 76 05/26/2014 0034   BILITOT 0.6 05/26/2014 0034   GFRNONAA 67* 05/27/2014 0424   GFRAA 69* 05/27/2014 0424       ASSESSMENT AND PLAN  80 y.o. year old female  has a past medical history of gradual onset of memory problems consistent with mild cognitive impairment. Memory score is stable. Differential diagnosis includes age-related vs chemotherapy radiation induced versus early central nervous system degenerative disorder. She has had side effects to Aricept and  Namenda in the past. She also has a pins and  needle sensation in her feet and lower extremities, peripheral neuropathy due to previous chemotherapy, and is currently on gabapentin with relief of symptoms.  Continue baby aspirin daily Continue low-dose gabapentin for her peripheral neuropathy  Continue exercise program Followup ias needed   Marcial Pacas, M.D. Ph.D.  [facility] [address]  [address] [place], [place] [address] [phone]

## 2015-01-13 ENCOUNTER — Other Ambulatory Visit: Payer: Self-pay | Admitting: Obstetrics and Gynecology

## 2015-01-13 DIAGNOSIS — Z8542 Personal history of malignant neoplasm of other parts of uterus: Secondary | ICD-10-CM | POA: Diagnosis not present

## 2015-01-13 DIAGNOSIS — C540 Malignant neoplasm of isthmus uteri: Secondary | ICD-10-CM | POA: Diagnosis not present

## 2015-01-13 DIAGNOSIS — I341 Nonrheumatic mitral (valve) prolapse: Secondary | ICD-10-CM | POA: Diagnosis not present

## 2015-01-13 DIAGNOSIS — Z1231 Encounter for screening mammogram for malignant neoplasm of breast: Secondary | ICD-10-CM

## 2015-01-13 DIAGNOSIS — R413 Other amnesia: Secondary | ICD-10-CM | POA: Diagnosis not present

## 2015-01-13 DIAGNOSIS — Z853 Personal history of malignant neoplasm of breast: Secondary | ICD-10-CM | POA: Diagnosis not present

## 2015-01-13 DIAGNOSIS — K5669 Other intestinal obstruction: Secondary | ICD-10-CM | POA: Diagnosis not present

## 2015-01-20 ENCOUNTER — Ambulatory Visit
Admission: RE | Admit: 2015-01-20 | Discharge: 2015-01-20 | Disposition: A | Discharge status: Home / Self Care | Payer: Medicare HMO | Source: Ambulatory Visit | Attending: Obstetrics and Gynecology | Admitting: Obstetrics and Gynecology

## 2015-01-20 DIAGNOSIS — Z1231 Encounter for screening mammogram for malignant neoplasm of breast: Secondary | ICD-10-CM

## 2015-01-21 DIAGNOSIS — C549 Malignant neoplasm of corpus uteri, unspecified: Secondary | ICD-10-CM | POA: Diagnosis not present

## 2015-02-28 ENCOUNTER — Emergency Department (HOSPITAL_COMMUNITY)
Admission: EM | Admit: 2015-02-28 | Discharge: 2015-03-01 | Disposition: A | Discharge status: Home / Self Care | Payer: Commercial Managed Care - HMO | Attending: Emergency Medicine | Admitting: Emergency Medicine

## 2015-02-28 ENCOUNTER — Encounter (HOSPITAL_COMMUNITY): Payer: Self-pay | Admitting: Emergency Medicine

## 2015-02-28 DIAGNOSIS — R197 Diarrhea, unspecified: Secondary | ICD-10-CM | POA: Diagnosis present

## 2015-02-28 DIAGNOSIS — G309 Alzheimer's disease, unspecified: Secondary | ICD-10-CM | POA: Insufficient documentation

## 2015-02-28 DIAGNOSIS — Z853 Personal history of malignant neoplasm of breast: Secondary | ICD-10-CM | POA: Insufficient documentation

## 2015-02-28 DIAGNOSIS — R109 Unspecified abdominal pain: Secondary | ICD-10-CM

## 2015-02-28 DIAGNOSIS — Z7982 Long term (current) use of aspirin: Secondary | ICD-10-CM | POA: Diagnosis not present

## 2015-02-28 DIAGNOSIS — Z87448 Personal history of other diseases of urinary system: Secondary | ICD-10-CM | POA: Insufficient documentation

## 2015-02-28 DIAGNOSIS — I10 Essential (primary) hypertension: Secondary | ICD-10-CM | POA: Diagnosis not present

## 2015-02-28 DIAGNOSIS — Z79899 Other long term (current) drug therapy: Secondary | ICD-10-CM | POA: Diagnosis not present

## 2015-02-28 DIAGNOSIS — Z8542 Personal history of malignant neoplasm of other parts of uterus: Secondary | ICD-10-CM | POA: Diagnosis not present

## 2015-02-28 DIAGNOSIS — K529 Noninfective gastroenteritis and colitis, unspecified: Secondary | ICD-10-CM | POA: Insufficient documentation

## 2015-02-28 NOTE — ED Notes (Signed)
Pt reports within the past 5 hours pt has been vomiting and diarrhea. Pt states she has hx of bowel obstruction.

## 2015-03-01 ENCOUNTER — Emergency Department (HOSPITAL_COMMUNITY): Payer: Commercial Managed Care - HMO

## 2015-03-01 ENCOUNTER — Encounter (HOSPITAL_COMMUNITY): Payer: Self-pay

## 2015-03-01 DIAGNOSIS — Z8542 Personal history of malignant neoplasm of other parts of uterus: Secondary | ICD-10-CM | POA: Diagnosis not present

## 2015-03-01 DIAGNOSIS — I10 Essential (primary) hypertension: Secondary | ICD-10-CM | POA: Diagnosis not present

## 2015-03-01 DIAGNOSIS — Z853 Personal history of malignant neoplasm of breast: Secondary | ICD-10-CM | POA: Diagnosis not present

## 2015-03-01 DIAGNOSIS — R1010 Upper abdominal pain, unspecified: Secondary | ICD-10-CM | POA: Diagnosis not present

## 2015-03-01 DIAGNOSIS — K598 Other specified functional intestinal disorders: Secondary | ICD-10-CM | POA: Diagnosis not present

## 2015-03-01 DIAGNOSIS — Z7982 Long term (current) use of aspirin: Secondary | ICD-10-CM | POA: Diagnosis not present

## 2015-03-01 DIAGNOSIS — Z87448 Personal history of other diseases of urinary system: Secondary | ICD-10-CM | POA: Diagnosis not present

## 2015-03-01 DIAGNOSIS — Z79899 Other long term (current) drug therapy: Secondary | ICD-10-CM | POA: Diagnosis not present

## 2015-03-01 DIAGNOSIS — K529 Noninfective gastroenteritis and colitis, unspecified: Secondary | ICD-10-CM | POA: Diagnosis not present

## 2015-03-01 DIAGNOSIS — R110 Nausea: Secondary | ICD-10-CM | POA: Diagnosis not present

## 2015-03-01 DIAGNOSIS — G309 Alzheimer's disease, unspecified: Secondary | ICD-10-CM | POA: Diagnosis not present

## 2015-03-01 LAB — COMPREHENSIVE METABOLIC PANEL
ALT: 18 U/L (ref 0–35)
AST: 24 U/L (ref 0–37)
Albumin: 3.9 g/dL (ref 3.5–5.2)
Alkaline Phosphatase: 62 U/L (ref 39–117)
Anion gap: 8 (ref 5–15)
BILIRUBIN TOTAL: 1 mg/dL (ref 0.3–1.2)
BUN: 17 mg/dL (ref 6–23)
CHLORIDE: 106 mmol/L (ref 96–112)
CO2: 26 mmol/L (ref 19–32)
CREATININE: 0.87 mg/dL (ref 0.50–1.10)
Calcium: 9.2 mg/dL (ref 8.4–10.5)
GFR calc Af Amer: 72 mL/min — ABNORMAL LOW (ref >90)
GFR calc non Af Amer: 62 mL/min — ABNORMAL LOW (ref >90)
Glucose, Bld: 117 mg/dL — ABNORMAL HIGH (ref 70–99)
POTASSIUM: 3.8 mmol/L (ref 3.5–5.1)
SODIUM: 140 mmol/L (ref 135–145)
Total Protein: 6.4 g/dL (ref 6.0–8.3)

## 2015-03-01 LAB — CBC WITH DIFFERENTIAL/PLATELET
BASOS ABS: 0 10*3/uL (ref 0.0–0.1)
BASOS PCT: 0 % (ref 0–1)
Eosinophils Absolute: 0 10*3/uL (ref 0.0–0.7)
Eosinophils Relative: 0 % (ref 0–5)
HCT: 38.1 % (ref 36.0–46.0)
Hemoglobin: 12.6 g/dL (ref 12.0–15.0)
Lymphocytes Relative: 10 % — ABNORMAL LOW (ref 12–46)
Lymphs Abs: 1.1 10*3/uL (ref 0.7–4.0)
MCH: 32.1 pg (ref 26.0–34.0)
MCHC: 33.1 g/dL (ref 30.0–36.0)
MCV: 96.9 fL (ref 78.0–100.0)
MONOS PCT: 7 % (ref 3–12)
Monocytes Absolute: 0.8 10*3/uL (ref 0.1–1.0)
NEUTROS PCT: 83 % — AB (ref 43–77)
Neutro Abs: 9.6 10*3/uL — ABNORMAL HIGH (ref 1.7–7.7)
PLATELETS: 265 10*3/uL (ref 150–400)
RBC: 3.93 MIL/uL (ref 3.87–5.11)
RDW: 13.5 % (ref 11.5–15.5)
WBC: 11.5 10*3/uL — ABNORMAL HIGH (ref 4.0–10.5)

## 2015-03-01 LAB — TROPONIN I: Troponin I: <0.03 ng/mL (ref <0.031)

## 2015-03-01 LAB — LIPASE, BLOOD: LIPASE: 24 U/L (ref 11–59)

## 2015-03-01 LAB — URINALYSIS, ROUTINE W REFLEX MICROSCOPIC
Bilirubin Urine: NEGATIVE
GLUCOSE, UA: NEGATIVE mg/dL
HGB URINE DIPSTICK: NEGATIVE
Ketones, ur: 40 mg/dL — AB
Leukocytes, UA: NEGATIVE
NITRITE: NEGATIVE
Protein, ur: NEGATIVE mg/dL
Specific Gravity, Urine: 1.026 (ref 1.005–1.030)
Urobilinogen, UA: 0.2 mg/dL (ref 0.0–1.0)
pH: 6 (ref 5.0–8.0)

## 2015-03-01 LAB — I-STAT CG4 LACTIC ACID, ED: Lactic Acid, Venous: 1.17 mmol/L (ref 0.5–2.0)

## 2015-03-01 MED ORDER — LOPERAMIDE HCL 2 MG PO CAPS: 2.0000 mg | ORAL_CAPSULE | Freq: Four times a day (QID) | ORAL | Status: DC | PRN

## 2015-03-01 MED ORDER — IOHEXOL 300 MG/ML  SOLN
50.0000 mL | Freq: Once | INTRAMUSCULAR | Status: AC | PRN
  Administered 2015-03-01: 50 mL via ORAL

## 2015-03-01 MED ORDER — SODIUM CHLORIDE 0.9 % IV BOLUS (SEPSIS)
500.0000 mL | Freq: Once | INTRAVENOUS | Status: AC
  Administered 2015-03-01: 500 mL via INTRAVENOUS

## 2015-03-01 MED ORDER — ONDANSETRON HCL 4 MG PO TABS: 4.0000 mg | ORAL_TABLET | Freq: Four times a day (QID) | ORAL | Status: DC

## 2015-03-01 MED ORDER — ONDANSETRON HCL 4 MG/2ML IJ SOLN
4.0000 mg | Freq: Once | INTRAMUSCULAR | Status: AC
  Administered 2015-03-01: 4 mg via INTRAVENOUS
  Filled 2015-03-01: qty 2

## 2015-03-01 MED ORDER — IOHEXOL 300 MG/ML  SOLN
100.0000 mL | Freq: Once | INTRAMUSCULAR | Status: AC | PRN
  Administered 2015-03-01: 100 mL via INTRAVENOUS

## 2015-03-01 NOTE — ED Provider Notes (Signed)
CSN: [id number]     Arrival date & time 02/28/15  2129 History   First MD Initiated Contact with Patient 02/28/15 2329     Chief Complaint  Patient presents with  . Abdominal Pain  . Emesis  . Diarrhea     (Consider location/radiation/quality/duration/timing/severity/associated sxs/prior Treatment) HPI Comments: Patient presents to the ER for evaluation of nausea, vomiting, diarrhea and yesterday afternoon. She has not experiencing any abdominal pain currently. She does have a history of bowel obstruction, is concerned that this may be a recurrent obstruction. She has not had any fever. She denies urinary symptoms. There is no chest pain or shortness of breath.  Patient is a 80 y.o. female presenting with abdominal pain, vomiting, and diarrhea.  Abdominal Pain Associated symptoms: diarrhea and vomiting   Emesis Associated symptoms: abdominal pain and diarrhea   Diarrhea Associated symptoms: abdominal pain and vomiting     Past Medical History  Diagnosis Date  . Hypertension   . Overactive bladder   . Sarcoma of central portion of left female breast   . High cholesterol   . Ileus   . Abdominal adhesions   . Carcinosarcoma of uterus s/p TAH BSO 2010 11/22/2012    REPORT OF SURGICAL PATHOLOGY  Case #: [id number] Patient Name: PARA, COSSEY Office Chart Number: N/A  MRN: [id number] Pathologist: Vonna Kotyk B. Lyndon Code, MD DOB/Age [birth year] (Age: 42) Gender: F Date Taken: 07/28/2009 Date Received: 07/28/2009  FINAL DIAGNOSIS  MICROSCOPIC EXAMINATION AND DIAGNOSIS  UTERUS, OVARIES AND FALLOPIAN TUBES, HYSTERECTOMY AND BILATERAL SALPINGO-OOPHORECTOMY: - CARCINOSARCOMA, ARISING FROM THE LOWER UTERINE SEGMENT, SPANNING 4.5 CM. - CARCINOSARCOMA IS 0.1 CM TO THE DEEP SOFT TISSUE RESECTION MARGIN. - CERVIX: ATROPHY AND CHRONIC INFLAMMATION. - ENDOMETRIUM: ATROPHIC APPEARING ENDOMETRIUM. - MYOMETRIUM: LEIOMYOMATA. - SEROSA: ESSENTIALLY UNREMARKABLE. - ADNEXA: PARATUBAL CYST  COMMENT Immunohistochemical  stains were performed on the malignant neoplasm with the following results. The controls stained appropriately.  P16 Positive Estrogen receptor Scattered positive cells Progesterone receptor Scattered positive cells Cytokeratin AE1/AE3 Positive in epithe  . 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 Alzheimer's disease with memory loss 06/25/2013   Past Surgical History  Procedure Laterality Date  . Abdominal hysterectomy  2010    Dr Christophe Louis  . Mastectomy partial / lumpectomy Left 1990    Breast sarcoma  . Ankle surgery    . Hernia repair      as a child   Family History  Problem Relation Age of Onset  . Cancer Father     pancreatic    History  Substance Use Topics  . Smoking status: Never Smoker   . Smokeless tobacco: Never Used  . Alcohol Use: No   OB History    No data available     Review of Systems  Gastrointestinal: Positive for vomiting, abdominal pain and diarrhea.  All other systems reviewed and are negative.     Allergies  Sulfa antibiotics  Home Medications   Prior to Admission medications   Medication Sig Start Date End Date Taking? Authorizing Provider  aspirin 81 MG chewable tablet Chew 81 mg by mouth every morning.   Yes Historical Provider, MD  atorvastatin (LIPITOR) 20 MG tablet Take 10-20 mg by mouth daily. Alternate 10 mg (1/2 tablet) with 20 mg (1 tablet ) every other day 07/28/12  Yes Historical Provider, MD  carboxymethylcellulose (REFRESH PLUS) 0.5 % SOLN Place 1 drop into both eyes 3 (three) times daily as needed (for irritated eyes).   Yes  Historical Provider, MD  gabapentin (NEURONTIN) 100 MG capsule Take 2 capsules (200 mg total) by mouth 4 (four) times daily. 10/11/14  Yes Marcial Pacas, MD  Multiple Vitamin (MULTIVITAMIN WITH MINERALS) TABS Take 1 tablet by mouth daily.   Yes Historical Provider, MD  polyethylene glycol (MIRALAX / GLYCOLAX) packet Take 8 g by mouth daily.  11/24/12  Yes Robbie Lis, MD   Probiotic Product (PROBIOTIC DAILY PO) Take 1 capsule by mouth daily.   Yes Historical Provider, MD  propranolol (INDERAL) 60 MG tablet Take 60 mg by mouth 2 (two) times daily.   Yes Historical Provider, MD  simethicone (GAS-X) 80 MG chewable tablet Chew 1 tablet (80 mg total) by mouth every 6 (six) hours as needed for flatulence. 11/24/12  Yes Robbie Lis, MD  loperamide (IMODIUM) 2 MG capsule Take 1 capsule (2 mg total) by mouth 4 (four) times daily as needed for diarrhea or loose stools. 03/01/15   Orpah Greek, MD  ondansetron (ZOFRAN) 4 MG tablet Take 1 tablet (4 mg total) by mouth every 6 (six) hours. 03/01/15   Orpah Greek, MD   BP 113/61 mmHg  Pulse 65  Temp(Src) 98.6 F (37 C) (Oral)  Resp 16  SpO2 94% Physical Exam  Constitutional: She is oriented to person, place, and time. She appears well-developed and well-nourished. No distress.  HENT:  Head: Normocephalic and atraumatic.  Right Ear: Hearing normal.  Left Ear: Hearing normal.  Nose: Nose normal.  Mouth/Throat: Oropharynx is clear and moist and mucous membranes are normal.  Eyes: Conjunctivae and EOM are normal. Pupils are equal, round, and reactive to light.  Neck: Normal range of motion. Neck supple.  Cardiovascular: Regular rhythm, S1 normal and S2 normal.  Exam reveals no gallop and no friction rub.   No murmur heard. Pulmonary/Chest: Effort normal and breath sounds normal. No respiratory distress. She exhibits no tenderness.  Abdominal: Soft. Normal appearance and bowel sounds are normal. There is no hepatosplenomegaly. There is no tenderness. There is no rebound, no guarding, no tenderness at McBurney's point and negative Murphy's sign. No hernia.  Musculoskeletal: Normal range of motion.  Neurological: She is alert and oriented to person, place, and time. She has normal strength. No cranial nerve deficit or sensory deficit. Coordination normal. GCS eye subscore is 4. GCS verbal subscore is 5. GCS  motor subscore is 6.  Skin: Skin is warm, dry and intact. No rash noted. No cyanosis.  Psychiatric: She has a normal mood and affect. Her speech is normal and behavior is normal. Thought content normal.  Nursing note and vitals reviewed.   ED Course  Procedures (including critical care time) Labs Review Labs Reviewed  CBC WITH DIFFERENTIAL/PLATELET - Abnormal; Notable for the following:    WBC 11.5 (*)    Neutrophils Relative % 83 (*)    Neutro Abs 9.6 (*)    Lymphocytes Relative 10 (*)    All other components within normal limits  COMPREHENSIVE METABOLIC PANEL - Abnormal; Notable for the following:    Glucose, Bld 117 (*)    GFR calc non Af Amer 62 (*)    GFR calc Af Amer 72 (*)    All other components within normal limits  URINALYSIS, ROUTINE W REFLEX MICROSCOPIC - Abnormal; Notable for the following:    Ketones, ur 40 (*)    All other components within normal limits  LIPASE, BLOOD  TROPONIN I  I-STAT CG4 LACTIC ACID, ED    Imaging Review No  results found.   EKG Interpretation None      MDM   Final diagnoses:  Abdominal pain, acute  Gastroenteritis    Patient presented to the ER for evaluation of nausea, vomiting and diarrhea. She had abdominal pain earlier, but the abdominal pain is improved. She had a benign exam here in the ER. Patient reports a history of bowel obstruction. Plain film x-ray did not show evidence of obstruction, but because of her history, CT scan was performed. There is no evidence of obstruction. Patient has thick-walled mildly dilated small bowel loops that are consistent with enteritis. This is consistent with her symptoms as well. Patient has been hydrated here in the ER. Vital signs are stable. She is afebrile. She is appropriate for discharge, symptomatic treatment for nausea, vomiting and diarrhea.    Orpah Greek, MD 03/04/15 [phone]

## 2015-03-01 NOTE — ED Notes (Signed)
Pt returned from CT °

## 2015-03-01 NOTE — ED Notes (Signed)
Pt reports n/v/d and abd pain that began yesterday afternoon - pt concerned d/t hx of bowel obstruction. Pt denies any nausea or abd pain at present.

## 2015-03-01 NOTE — Discharge Instructions (Signed)

## 2015-03-04 DIAGNOSIS — K5289 Other specified noninfective gastroenteritis and colitis: Secondary | ICD-10-CM | POA: Diagnosis not present

## 2015-04-01 DIAGNOSIS — I10 Essential (primary) hypertension: Secondary | ICD-10-CM | POA: Diagnosis not present

## 2015-04-01 DIAGNOSIS — R413 Other amnesia: Secondary | ICD-10-CM | POA: Diagnosis not present

## 2015-04-01 DIAGNOSIS — C55 Malignant neoplasm of uterus, part unspecified: Secondary | ICD-10-CM | POA: Diagnosis not present

## 2015-04-01 DIAGNOSIS — E782 Mixed hyperlipidemia: Secondary | ICD-10-CM | POA: Diagnosis not present

## 2015-05-04 DIAGNOSIS — Z961 Presence of intraocular lens: Secondary | ICD-10-CM | POA: Diagnosis not present

## 2015-05-04 DIAGNOSIS — H04123 Dry eye syndrome of bilateral lacrimal glands: Secondary | ICD-10-CM | POA: Diagnosis not present

## 2015-05-04 DIAGNOSIS — H02403 Unspecified ptosis of bilateral eyelids: Secondary | ICD-10-CM | POA: Diagnosis not present

## 2015-05-04 DIAGNOSIS — H26493 Other secondary cataract, bilateral: Secondary | ICD-10-CM | POA: Diagnosis not present

## 2015-05-09 ENCOUNTER — Other Ambulatory Visit: Payer: Self-pay

## 2015-07-15 ENCOUNTER — Ambulatory Visit
Admission: RE | Admit: 2015-07-15 | Discharge: 2015-07-15 | Disposition: A | Discharge status: Home / Self Care | Payer: Commercial Managed Care - HMO | Source: Ambulatory Visit | Attending: Internal Medicine | Admitting: Internal Medicine

## 2015-07-15 ENCOUNTER — Other Ambulatory Visit: Payer: Self-pay | Admitting: Internal Medicine

## 2015-07-15 DIAGNOSIS — R109 Unspecified abdominal pain: Secondary | ICD-10-CM | POA: Diagnosis not present

## 2015-07-15 DIAGNOSIS — R197 Diarrhea, unspecified: Secondary | ICD-10-CM | POA: Diagnosis not present

## 2015-09-23 ENCOUNTER — Emergency Department (HOSPITAL_COMMUNITY): Payer: Commercial Managed Care - HMO

## 2015-09-23 ENCOUNTER — Encounter (HOSPITAL_COMMUNITY): Payer: Self-pay | Admitting: Emergency Medicine

## 2015-09-23 ENCOUNTER — Observation Stay (HOSPITAL_COMMUNITY)
Admission: EM | Admit: 2015-09-23 | Discharge: 2015-09-25 | Disposition: A | Discharge status: Home / Self Care | Payer: Commercial Managed Care - HMO | Attending: Family Medicine | Admitting: Family Medicine

## 2015-09-23 DIAGNOSIS — N3281 Overactive bladder: Secondary | ICD-10-CM | POA: Insufficient documentation

## 2015-09-23 DIAGNOSIS — Z79899 Other long term (current) drug therapy: Secondary | ICD-10-CM | POA: Insufficient documentation

## 2015-09-23 DIAGNOSIS — K562 Volvulus: Secondary | ICD-10-CM | POA: Diagnosis not present

## 2015-09-23 DIAGNOSIS — R112 Nausea with vomiting, unspecified: Principal | ICD-10-CM | POA: Diagnosis present

## 2015-09-23 DIAGNOSIS — K660 Peritoneal adhesions (postprocedural) (postinfection): Secondary | ICD-10-CM | POA: Insufficient documentation

## 2015-09-23 DIAGNOSIS — K529 Noninfective gastroenteritis and colitis, unspecified: Secondary | ICD-10-CM | POA: Diagnosis present

## 2015-09-23 DIAGNOSIS — K520 Gastroenteritis and colitis due to radiation: Secondary | ICD-10-CM | POA: Diagnosis present

## 2015-09-23 DIAGNOSIS — I10 Essential (primary) hypertension: Secondary | ICD-10-CM | POA: Diagnosis present

## 2015-09-23 DIAGNOSIS — E7800 Pure hypercholesterolemia, unspecified: Secondary | ICD-10-CM | POA: Diagnosis not present

## 2015-09-23 DIAGNOSIS — F0280 Dementia in other diseases classified elsewhere without behavioral disturbance: Secondary | ICD-10-CM | POA: Diagnosis present

## 2015-09-23 DIAGNOSIS — Z853 Personal history of malignant neoplasm of breast: Secondary | ICD-10-CM | POA: Insufficient documentation

## 2015-09-23 DIAGNOSIS — Z7982 Long term (current) use of aspirin: Secondary | ICD-10-CM | POA: Insufficient documentation

## 2015-09-23 DIAGNOSIS — G309 Alzheimer's disease, unspecified: Secondary | ICD-10-CM | POA: Diagnosis not present

## 2015-09-23 DIAGNOSIS — R109 Unspecified abdominal pain: Secondary | ICD-10-CM

## 2015-09-23 DIAGNOSIS — R1084 Generalized abdominal pain: Secondary | ICD-10-CM | POA: Diagnosis not present

## 2015-09-23 LAB — CBC
HCT: 40.8 % (ref 36.0–46.0)
Hemoglobin: 13.7 g/dL (ref 12.0–15.0)
MCH: 32.5 pg (ref 26.0–34.0)
MCHC: 33.6 g/dL (ref 30.0–36.0)
MCV: 96.9 fL (ref 78.0–100.0)
Platelets: 314 10*3/uL (ref 150–400)
RBC: 4.21 MIL/uL (ref 3.87–5.11)
RDW: 13.1 % (ref 11.5–15.5)
WBC: 14.3 10*3/uL — ABNORMAL HIGH (ref 4.0–10.5)

## 2015-09-23 LAB — LIPASE, BLOOD: LIPASE: 44 U/L (ref 11–51)

## 2015-09-23 LAB — COMPREHENSIVE METABOLIC PANEL
ALT: 15 U/L (ref 14–54)
AST: 23 U/L (ref 15–41)
Albumin: 4.3 g/dL (ref 3.5–5.0)
Alkaline Phosphatase: 70 U/L (ref 38–126)
Anion gap: 11 (ref 5–15)
BUN: 16 mg/dL (ref 6–20)
CHLORIDE: 100 mmol/L — AB (ref 101–111)
CO2: 27 mmol/L (ref 22–32)
Calcium: 10.1 mg/dL (ref 8.9–10.3)
Creatinine, Ser: 0.98 mg/dL (ref 0.44–1.00)
GFR calc non Af Amer: 54 mL/min — ABNORMAL LOW (ref >60)
Glucose, Bld: 166 mg/dL — ABNORMAL HIGH (ref 65–99)
Potassium: 4.2 mmol/L (ref 3.5–5.1)
SODIUM: 138 mmol/L (ref 135–145)
Total Bilirubin: 1.1 mg/dL (ref 0.3–1.2)
Total Protein: 7.2 g/dL (ref 6.5–8.1)

## 2015-09-23 MED ORDER — SODIUM CHLORIDE 0.9 % IV SOLN
INTRAVENOUS | Status: DC
  Administered 2015-09-23: 75 mL/h via INTRAVENOUS

## 2015-09-23 MED ORDER — IOHEXOL 300 MG/ML  SOLN
50.0000 mL | Freq: Once | INTRAMUSCULAR | Status: AC | PRN
  Administered 2015-09-23: 50 mL via ORAL

## 2015-09-23 MED ORDER — MORPHINE SULFATE (PF) 4 MG/ML IV SOLN
4.0000 mg | INTRAVENOUS | Status: DC | PRN
  Filled 2015-09-23: qty 1

## 2015-09-23 MED ORDER — ONDANSETRON HCL 4 MG/2ML IJ SOLN
4.0000 mg | INTRAMUSCULAR | Status: DC | PRN
  Administered 2015-09-23: 4 mg via INTRAVENOUS
  Filled 2015-09-23: qty 2

## 2015-09-23 NOTE — ED Notes (Signed)
Pt from home c/o generalized abdominal pain x several hours.  Pt reports nausea,vomiting and diarrhea.

## 2015-09-23 NOTE — ED Provider Notes (Signed)
CSN: [id number]     Arrival date & time 09/23/15  2209 History   First MD Initiated Contact with Patient 09/23/15 2305     Chief Complaint  Patient presents with  . Abdominal Pain      HPI Pt was seen at 2305.  Per pt and her spouse, c/o gradual onset and persistence of constant generalized abd "pain" since yesterday.  Has been associated with multiple intermittent episodes of N/V/D.  Describes the abd pain as "aching" and "like my last bowel obstruction."  Denies fevers, no back pain, no rash, no CP/SOB, no black or blood in stools or emesis.       Past Medical History  Diagnosis Date  . Hypertension   . Overactive bladder   . 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 High cholesterol   . Ileus ([place])   . Abdominal adhesions   . Carcinosarcoma of uterus s/p TAH BSO 2010 11/22/2012    REPORT OF SURGICAL PATHOLOGY  Case #: [id number] Patient Name: FALISA, MARCUCCI Office Chart Number: N/A  MRN: [id number] Pathologist: Vonna Kotyk B. Lyndon Code, MD DOB/Age 27-Nov-1934 (Age: 80) Gender: F Date Taken: 07/28/2009 Date Received: 07/28/2009  FINAL DIAGNOSIS  MICROSCOPIC EXAMINATION AND DIAGNOSIS  UTERUS, OVARIES AND FALLOPIAN TUBES, HYSTERECTOMY AND BILATERAL SALPINGO-OOPHORECTOMY: - CARCINOSARCOMA, ARISING FROM THE LOWER UTERINE SEGMENT, SPANNING 4.5 CM. - CARCINOSARCOMA IS 0.1 CM TO THE DEEP SOFT TISSUE RESECTION MARGIN. - CERVIX: ATROPHY AND CHRONIC INFLAMMATION. - ENDOMETRIUM: ATROPHIC APPEARING ENDOMETRIUM. - MYOMETRIUM: LEIOMYOMATA. - SEROSA: ESSENTIALLY UNREMARKABLE. - ADNEXA: PARATUBAL CYST  COMMENT Immunohistochemical stains were performed on the malignant neoplasm with the following results. The controls stained appropriately.  P16 Positive Estrogen receptor Scattered positive cells Progesterone receptor Scattered positive cells Cytokeratin AE1/AE3 Positive in epithe  . 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 Alzheimer's disease with memory loss  06/25/2013   Past Surgical History  Procedure Laterality Date  . Abdominal hysterectomy  2010    Dr Christophe Louis  . Mastectomy partial / lumpectomy Left 1990    Breast sarcoma  . Ankle surgery    . Hernia repair      as a child   Family History  Problem Relation Age of Onset  . Cancer Father     pancreatic    Social History  Substance Use Topics  . Smoking status: Never Smoker   . Smokeless tobacco: Never Used  . Alcohol Use: No    Review of Systems ROS: Statement: All systems negative except as marked or noted in the HPI; Constitutional: Negative for fever and chills. ; ; Eyes: Negative for eye pain, redness and discharge. ; ; ENMT: Negative for ear pain, hoarseness, nasal congestion, sinus pressure and sore throat. ; ; Cardiovascular: Negative for chest pain, palpitations, diaphoresis, dyspnea and peripheral edema. ; ; Respiratory: Negative for cough, wheezing and stridor. ; ; Gastrointestinal: +N/V/D, abd pain. Negative for blood in stool, hematemesis, jaundice and rectal bleeding. . ; ; Genitourinary: Negative for dysuria, flank pain and hematuria. ; ; Musculoskeletal: Negative for back pain and neck pain. Negative for swelling and trauma.; ; Skin: Negative for pruritus, rash, abrasions, blisters, bruising and skin lesion.; ; Neuro: Negative for headache, lightheadedness and neck stiffness. Negative for weakness, altered level of consciousness , altered mental status, extremity weakness, paresthesias, involuntary movement, seizure and syncope.      Allergies  Sulfa antibiotics  Home Medications   Prior to Admission medications   Medication Sig Start  Date End Date Taking? Authorizing Provider  aspirin 81 MG chewable tablet Chew 81 mg by mouth every morning.   Yes Historical Provider, MD  atorvastatin (LIPITOR) 20 MG tablet Take 10-20 mg by mouth daily. Alternate 10 mg (1/2 tablet) with 20 mg (1 tablet ) every other day 07/28/12  Yes Historical Provider, MD  gabapentin (NEURONTIN)  100 MG capsule Take 2 capsules (200 mg total) by mouth 4 (four) times daily. 10/11/14  Yes Marcial Pacas, MD  loperamide (IMODIUM) 2 MG capsule Take 1 capsule (2 mg total) by mouth 4 (four) times daily as needed for diarrhea or loose stools. 03/01/15  Yes Orpah Greek, MD  Multiple Vitamin (MULTIVITAMIN WITH MINERALS) TABS Take 1 tablet by mouth daily.   Yes Historical Provider, MD  polyethylene glycol (MIRALAX / GLYCOLAX) packet Take 8 g by mouth daily.  11/24/12  Yes Robbie Lis, MD  Probiotic Product (PROBIOTIC DAILY PO) Take 1 capsule by mouth daily.   Yes Historical Provider, MD  propranolol (INDERAL) 60 MG tablet Take 60 mg by mouth 2 (two) times daily.   Yes Historical Provider, MD  carboxymethylcellulose (REFRESH PLUS) 0.5 % SOLN Place 1 drop into both eyes 3 (three) times daily as needed (for irritated eyes).    Historical Provider, MD  ondansetron (ZOFRAN) 4 MG tablet Take 1 tablet (4 mg total) by mouth every 6 (six) hours. 03/01/15   Orpah Greek, MD  simethicone (GAS-X) 80 MG chewable tablet Chew 1 tablet (80 mg total) by mouth every 6 (six) hours as needed for flatulence. 11/24/12   Robbie Lis, MD   BP 127/76 mmHg  Pulse 70  Temp(Src) 97.6 F (36.4 C) (Oral)  Resp 18  Wt 124 lb (56.246 kg)  SpO2 100% Physical Exam  2310: Physical examination:  Nursing notes reviewed; Vital signs and O2 SAT reviewed;  Constitutional: Well developed, Well nourished, Uncomfortable appearing.; Head:  Normocephalic, atraumatic; Eyes: EOMI, PERRL, No scleral icterus; ENMT: Mouth and pharynx normal, Mucous membranes dry; Neck: Supple, Full range of motion, No lymphadenopathy; Cardiovascular: Regular rate and rhythm, No gallop; Respiratory: Breath sounds clear & equal bilaterally, No wheezes.  Speaking full sentences with ease, Normal respiratory effort/excursion; Chest: Nontender, Movement normal; Abdomen: Soft, +diffusely tender to palp. No rebound or guarding. Nondistended, Decreased bowel  sounds; Genitourinary: No CVA tenderness; Extremities: Pulses normal, No tenderness, No edema, No calf edema or asymmetry.; Neuro: AA&Ox3, Major CN grossly intact.  Speech clear. No gross focal motor deficits in extremities.; Skin: Color normal, Warm, Dry.   ED Course  Procedures (including critical care time) Labs Review   Imaging Review  I have personally reviewed and evaluated these images and lab results as part of my medical decision-making.   EKG Interpretation None      MDM  MDM Reviewed: previous chart, nursing note and vitals Reviewed previous: labs Interpretation: labs, x-ray and CT scan     Results for orders placed or performed during the hospital encounter of 09/23/15  Lipase, blood  Result Value Ref Range   Lipase 44 11 - 51 U/L  Comprehensive metabolic panel  Result Value Ref Range   Sodium 138 135 - 145 mmol/L   Potassium 4.2 3.5 - 5.1 mmol/L   Chloride 100 (L) 101 - 111 mmol/L   CO2 27 22 - 32 mmol/L   Glucose, Bld 166 (H) 65 - 99 mg/dL   BUN 16 6 - 20 mg/dL   Creatinine, Ser 0.98 0.44 - 1.00 mg/dL  Calcium 10.1 8.9 - 10.3 mg/dL   Total Protein 7.2 6.5 - 8.1 g/dL   Albumin 4.3 3.5 - 5.0 g/dL   AST 23 15 - 41 U/L   ALT 15 14 - 54 U/L   Alkaline Phosphatase 70 38 - 126 U/L   Total Bilirubin 1.1 0.3 - 1.2 mg/dL   GFR calc non Af Amer 54 (L) >60 mL/min   GFR calc Af Amer >60 >60 mL/min   Anion gap 11 5 - 15  CBC  Result Value Ref Range   WBC 14.3 (H) 4.0 - 10.5 K/uL   RBC 4.21 3.87 - 5.11 MIL/uL   Hemoglobin 13.7 12.0 - 15.0 g/dL   HCT 40.8 36.0 - 46.0 %   MCV 96.9 78.0 - 100.0 fL   MCH 32.5 26.0 - 34.0 pg   MCHC 33.6 30.0 - 36.0 g/dL   RDW 13.1 11.5 - 15.5 %   Platelets 314 150 - 400 K/uL  I-Stat CG4 Lactic Acid, ED  Result Value Ref Range   Lactic Acid, Venous 1.61 0.5 - 2.0 mmol/L   Dg Chest 2 View 09/24/2015  CLINICAL DATA:  Acute onset of generalized abdominal pain. Nausea, vomiting and diarrhea. Initial encounter. EXAM: CHEST  2 VIEW  COMPARISON:  Chest radiograph performed 03/01/2015 FINDINGS: The lungs are well-aerated and clear. There is no evidence of focal opacification, pleural effusion or pneumothorax. The heart is normal in size; the mediastinal contour is within normal limits. No acute osseous abnormalities are seen. Scattered clips are seen overlying the left breast. IMPRESSION: No acute cardiopulmonary process seen. Electronically Signed   By: Garald Balding M.D.   On: 09/24/2015 00:33   Ct Abdomen Pelvis W Contrast 09/24/2015  CLINICAL DATA:  Acute onset of nausea, vomiting and diarrhea. Generalized abdominal pain. Leukocytosis. Initial encounter. EXAM: CT ABDOMEN AND PELVIS WITH CONTRAST TECHNIQUE: Multidetector CT imaging of the abdomen and pelvis was performed using the standard protocol following bolus administration of intravenous contrast. CONTRAST:  134mL OMNIPAQUE IOHEXOL 300 MG/ML  SOLN COMPARISON:  CT of the abdomen and pelvis from 03/01/2015 FINDINGS: The visualized lung bases are clear. The liver and spleen are unremarkable in appearance. The gallbladder is within normal limits. The pancreas and adrenal glands are unremarkable. The kidneys are unremarkable in appearance. There is no evidence of hydronephrosis. No renal or ureteral stones are seen. No perinephric stranding is appreciated. There is slightly increased enhancement of the wall of the mid to distal ileum, with a relatively long segment of ileal wall thickening within the pelvis, and trace associated free fluid. This is concerning for infectious or inflammatory ileitis. The stomach is within normal limits. No acute vascular abnormalities are seen. The appendix is not definitely seen; there is no evidence of appendicitis. The colon is unremarkable in appearance. The bladder is mildly distended and grossly unremarkable. The patient is status post hysterectomy. No suspicious adnexal masses are seen. No inguinal lymphadenopathy is seen. No acute osseous  abnormalities are identified. There is grade 1 anterolisthesis of L4 on L5, and of L5 on S1, reflecting underlying facet disease. IMPRESSION: Slightly increased enhancement of the wall of the mid to distal ileum, with a relatively long segment of ileal wall thickening within the pelvis, and trace associated free fluid. This is concerning for infectious or inflammatory ileitis. Electronically Signed   By: Garald Balding M.D.   On: 09/24/2015 00:23    0100:  IV abx started for ileitis. Dx and testing d/w pt and family.  Questions  answered.  Verb understanding, agreeable to admit. T/C to [facility] Dr. Loleta Books, case discussed, including:  HPI, pertinent PM/SHx, VS/PE, dx testing, ED course and treatment:  Agreeable to admit, requests to write temporary orders, obtain observation medical bed to team [facility].   Francine Graven, DO 09/26/15 1511

## 2015-09-24 ENCOUNTER — Emergency Department (HOSPITAL_COMMUNITY): Payer: Commercial Managed Care - HMO

## 2015-09-24 ENCOUNTER — Encounter (HOSPITAL_COMMUNITY): Payer: Self-pay | Admitting: Family Medicine

## 2015-09-24 DIAGNOSIS — K520 Gastroenteritis and colitis due to radiation: Secondary | ICD-10-CM

## 2015-09-24 DIAGNOSIS — G309 Alzheimer's disease, unspecified: Secondary | ICD-10-CM | POA: Diagnosis not present

## 2015-09-24 DIAGNOSIS — R112 Nausea with vomiting, unspecified: Secondary | ICD-10-CM | POA: Diagnosis present

## 2015-09-24 DIAGNOSIS — K660 Peritoneal adhesions (postprocedural) (postinfection): Secondary | ICD-10-CM | POA: Diagnosis not present

## 2015-09-24 DIAGNOSIS — R110 Nausea: Secondary | ICD-10-CM | POA: Diagnosis not present

## 2015-09-24 DIAGNOSIS — R1084 Generalized abdominal pain: Secondary | ICD-10-CM | POA: Diagnosis not present

## 2015-09-24 DIAGNOSIS — Z7982 Long term (current) use of aspirin: Secondary | ICD-10-CM | POA: Diagnosis not present

## 2015-09-24 DIAGNOSIS — I10 Essential (primary) hypertension: Secondary | ICD-10-CM

## 2015-09-24 DIAGNOSIS — R197 Diarrhea, unspecified: Secondary | ICD-10-CM | POA: Diagnosis not present

## 2015-09-24 DIAGNOSIS — Z79899 Other long term (current) drug therapy: Secondary | ICD-10-CM | POA: Diagnosis not present

## 2015-09-24 DIAGNOSIS — K529 Noninfective gastroenteritis and colitis, unspecified: Secondary | ICD-10-CM | POA: Diagnosis present

## 2015-09-24 DIAGNOSIS — F0280 Dementia in other diseases classified elsewhere without behavioral disturbance: Secondary | ICD-10-CM

## 2015-09-24 DIAGNOSIS — E7800 Pure hypercholesterolemia, unspecified: Secondary | ICD-10-CM | POA: Diagnosis not present

## 2015-09-24 DIAGNOSIS — N3281 Overactive bladder: Secondary | ICD-10-CM | POA: Diagnosis not present

## 2015-09-24 DIAGNOSIS — K562 Volvulus: Secondary | ICD-10-CM | POA: Diagnosis not present

## 2015-09-24 LAB — CBC
HCT: 33.8 % — ABNORMAL LOW (ref 36.0–46.0)
Hemoglobin: 11.1 g/dL — ABNORMAL LOW (ref 12.0–15.0)
MCH: 31.7 pg (ref 26.0–34.0)
MCHC: 32.8 g/dL (ref 30.0–36.0)
MCV: 96.6 fL (ref 78.0–100.0)
PLATELETS: 247 10*3/uL (ref 150–400)
RBC: 3.5 MIL/uL — ABNORMAL LOW (ref 3.87–5.11)
RDW: 13 % (ref 11.5–15.5)
WBC: 11.5 10*3/uL — AB (ref 4.0–10.5)

## 2015-09-24 LAB — URINALYSIS, ROUTINE W REFLEX MICROSCOPIC
BILIRUBIN URINE: NEGATIVE
GLUCOSE, UA: NEGATIVE mg/dL
HGB URINE DIPSTICK: NEGATIVE
KETONES UR: NEGATIVE mg/dL
Nitrite: NEGATIVE
PH: 5.5 (ref 5.0–8.0)
Protein, ur: NEGATIVE mg/dL
Specific Gravity, Urine: 1.02 (ref 1.005–1.030)
Urobilinogen, UA: 0.2 mg/dL (ref 0.0–1.0)

## 2015-09-24 LAB — URINE MICROSCOPIC-ADD ON

## 2015-09-24 LAB — I-STAT CG4 LACTIC ACID, ED: Lactic Acid, Venous: 1.61 mmol/L (ref 0.5–2.0)

## 2015-09-24 MED ORDER — ONDANSETRON HCL 4 MG/2ML IJ SOLN: 4.0000 mg | Freq: Four times a day (QID) | INTRAMUSCULAR | Status: DC | PRN

## 2015-09-24 MED ORDER — METRONIDAZOLE IN NACL 5-0.79 MG/ML-% IV SOLN
500.0000 mg | Freq: Once | INTRAVENOUS | Status: DC
  Administered 2015-09-24: 500 mg via INTRAVENOUS
  Filled 2015-09-24: qty 100

## 2015-09-24 MED ORDER — IOHEXOL 300 MG/ML  SOLN
100.0000 mL | Freq: Once | INTRAMUSCULAR | Status: AC | PRN
  Administered 2015-09-24: 100 mL via INTRAVENOUS

## 2015-09-24 MED ORDER — SODIUM CHLORIDE 0.9 % IV SOLN
INTRAVENOUS | Status: DC
  Administered 2015-09-24: 10:00:00 via INTRAVENOUS

## 2015-09-24 MED ORDER — GABAPENTIN 100 MG PO CAPS
200.0000 mg | ORAL_CAPSULE | Freq: Four times a day (QID) | ORAL | Status: DC
  Administered 2015-09-24 – 2015-09-25 (×7): 200 mg via ORAL
  Filled 2015-09-24 (×7): qty 2

## 2015-09-24 MED ORDER — ONDANSETRON HCL 4 MG PO TABS: 4.0000 mg | ORAL_TABLET | Freq: Four times a day (QID) | ORAL | Status: DC | PRN

## 2015-09-24 MED ORDER — ASPIRIN 81 MG PO CHEW
81.0000 mg | CHEWABLE_TABLET | Freq: Every morning | ORAL | Status: DC
  Administered 2015-09-24 – 2015-09-25 (×2): 81 mg via ORAL
  Filled 2015-09-24 (×2): qty 1

## 2015-09-24 MED ORDER — PROPRANOLOL HCL 10 MG PO TABS
60.0000 mg | ORAL_TABLET | Freq: Two times a day (BID) | ORAL | Status: DC
  Filled 2015-09-24 (×2): qty 6

## 2015-09-24 MED ORDER — ATORVASTATIN CALCIUM 10 MG PO TABS
10.0000 mg | ORAL_TABLET | ORAL | Status: DC
  Administered 2015-09-24: 10 mg via ORAL
  Filled 2015-09-24 (×2): qty 1

## 2015-09-24 MED ORDER — ATORVASTATIN CALCIUM 10 MG PO TABS
20.0000 mg | ORAL_TABLET | ORAL | Status: DC
  Administered 2015-09-25: 20 mg via ORAL
  Filled 2015-09-24: qty 2

## 2015-09-24 MED ORDER — ACETAMINOPHEN 325 MG PO TABS: 650.0000 mg | ORAL_TABLET | Freq: Four times a day (QID) | ORAL | Status: DC | PRN

## 2015-09-24 MED ORDER — MORPHINE SULFATE (PF) 2 MG/ML IV SOLN: 2.0000 mg | INTRAVENOUS | Status: DC | PRN

## 2015-09-24 MED ORDER — ACETAMINOPHEN 650 MG RE SUPP: 650.0000 mg | Freq: Four times a day (QID) | RECTAL | Status: DC | PRN

## 2015-09-24 MED ORDER — POLYVINYL ALCOHOL 1.4 % OP SOLN
1.0000 [drp] | Freq: Three times a day (TID) | OPHTHALMIC | Status: DC | PRN
  Filled 2015-09-24: qty 15

## 2015-09-24 NOTE — Progress Notes (Addendum)
   12:10 PM I agree with HPI/GPe and A/P per Dr. Loleta Books from earlier this am      80 y/o ?  Prior SBO 09/2012, 11/2012, 06/2013, 05/2014 Prior L Breast CA s/p lumpectomy/mastectomy Ca uterus stg IIs/p TAH/BSO 2010 HLD HTN Enteritis in the past-thought at some point to be related to XRT  She has mod-severe alzheimer's and is followed by Dr. Krista Blue of Neurology  Symptoms started yesterday afternoon around 5 PM and worsened to severe abdominal pain 9. On 11/11 Her symptoms have resolved this morning she has no pain whatsoever and is actually tolerating a diet of chicken and macaroni and cheese  No fever or chills no nausea no vomiting Has been at bedside tells me that she has some short-term memory loss and is for some long-term memories but otherwise is pretty sharp He also states that about 1 to 2 months ago she had episodic severe pain had an x-ray and was managed at home over the weekend even though she had a partial SBO and this seemed to resolve    CARDIACS1-S2 no murmur rub or gallop  ABDOMEN soft nontender no distention no rebound  NEURO grossly intact  SKIN/MUSCULARGrossly intact   Patient Active Problem List   Diagnosis Date Noted  . Nausea and vomiting in adult 09/24/2015  . Enteritis 09/24/2015  . 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 Disturbance of skin sensation 03/09/2014  . Memory loss 09/08/2013  . HTN (hypertension) 06/25/2013  . Other and unspecified hyperlipidemia 06/25/2013  . Alzheimer's disease with memory loss 06/25/2013  . SBO (small bowel obstruction) - recurrent 11/22/2012  . Carcinosarcoma of uterus s/p TAH BSO 2010 11/22/2012  . Hypercholesterolemia 11/22/2012   Agree with plan of care as dictated per Dr. Frutoso Schatz monitor diet tolerance over the next 24-48 hours Do not think this is infectious Probably had an SBO that resolved spontaneously and can probably be discharged in a.m. if tolerating diet can  discontinue saline for now She has had transient hypotension today 2/2 to n/v and can hold propanolol for now  Verneita Griffes, MD [facility] ([phone]

## 2015-09-24 NOTE — Plan of Care (Signed)
Problem: Safety: Goal: Ability to remain free from injury will improve Outcome: Progressing oob with assist

## 2015-09-24 NOTE — Progress Notes (Signed)
Pt. BP low 71/42, 82/50 manual. Pt. Denies complaints of dizziness, chest pain, and no shortness of breath noted. MD notified, held Inderal per MD order.

## 2015-09-24 NOTE — Plan of Care (Signed)
Problem: Pain Managment: Goal: General experience of comfort will improve Outcome: Progressing Patient admitted to unit to 1337 from ED. Denying pain at present. Patient and spouse oriented to unit and routine.

## 2015-09-24 NOTE — H&P (Signed)
History and Physical  Patient Name: Laura Perkins     [id number]    DOB: April 26, 1935    DOA: 09/23/2015 Referring physician: Francine Graven, MD PCP: Kandice Hams, MD      Chief Complaint: Abdominal pain  HPI: Laura Perkins is a 80 y.o. female with a past medical history significant for dementia, carcinosarcoma of the uterus in remission c/b radiation enteritis with recurrent SBO and acute enteritis who presents with acute nausea, vomiting, diarrhea and abdominal pain.  Was in her usual state of health until yesterday when she felt some mild abdominal pain and malaise. She took some MiraLAX and said she felt better afterwards. Then late in the day today the patient had recurrence of severe abdominal pain, nausea, nonbloody nonbilious vomiting, and diarrhea several times, and so her husband brought her to the emergency room.  In the ED, the patient was afebrile and hemodynamically stable. Her renal function was normal, her electrolytes were unremarkable, her transaminases and bilirubin were normal, her lipase was normal, and she had leukocytosis.  A CT of the abdomen and pelvis with contrast showed a region of ileal thickening but was otherwise unremarkable and negative for SBO.  The patient's symptoms resolved with IV fluids and ondansetron. TRH were asked to admit for observation of acute enteritis.     Review of Systems:  Pt complains of memory loss, periumbilical abdominal pain, nausea, vomiting, diarrhea, malaise. Pt denies any fever, hematemesis, melena, hematochezia, symptoms preceding yesterday, confusion.  All other systems negative except as just noted or noted in the history of present illness.   Allergies: Sulfa   Home medications: 1. Aspirin 81 mg daily 2. Atorvastatin 20 mg daily 3. Propranolol 60 mg twice daily for blood pressure 4. Gabapentin 200 mg 4 times daily 5. Zofran, simethicone, MiraLAX, and loperamide as needed  Past medical history: 1.  Carcino sarcoma of the uterus in remission. 2. Radiation enteritis, c/b recurrent SBO 3. Essential hypertension 4. Alzheimer's dementia  Past surgical history: 1. TAH/BSO, 2010 2. Partial mastectomy 3. Orthopedic ankle surgery 4. Childhood hernia repair  Family history:  Father, cancer. No family history of inflammatory bowel disease.    Social History:  Patient lives with her husband. She does not drive. She relates without a walker or cane. She is a never smoker.        Physical Exam: BP 127/76 mmHg  Pulse 70  Temp(Src) 97.6 F (36.4 C) (Oral)  Resp 18  Wt 56.246 kg (124 lb)  SpO2 100% General appearance: Thin adult female, alert and in no acute distress.   Eyes: Anicteric, conjunctiva pink, lids and lashes normal.     ENT: No nasal deformity, discharge, or epistaxis.  OP moist without lesions.  lips dry. Lymph: No cervical, supraclavicular or axillary lymphadenopathy. Skin: Warm and dry.  No jaundice.  No suspicious rashes or lesions. Cardiac: RRR, nl S1-S2, no murmurs appreciated.  Capillary refill is brisk.  No LE edema.  Radial pulses 2+ and symmetric. Respiratory: Normal respiratory rate and rhythm.  CTAB without rales or wheezes. Abdomen: Abdomen soft without rigidity.  Bowel sounds diminished. Mild diffuse TTP without guarding . No ascites, distension.   MSK: No deformities or effusions. Neuro: Memory impaired, but remembers the events of today. Sensorium intact and responding to questions, attention normal.  Speech is fluent.  Moves all extremities equally and with normal coordination.    Psych: Behavior appropriate.  Affect blunted.  No evidence of aural or visual hallucinations or delusions.  Labs on Admission:  The metabolic panel shows normal sodium, potassium, bicarbonate, and renal function. The transaminases and bilirubin are normal. The lipase is 44 U/L. The complete blood count shows leukocytosis 14.3 K/uL.   Radiological Exams on  Admission: Personally reviewed: Dg Chest 2 View 09/24/2015 Clear without focal opacities.    Ct Abdomen Pelvis W Contrast 09/24/2015  IMPRESSION: Slightly increased enhancement of the wall of the mid to distal ileum, with a relatively long segment of ileal wall thickening within the pelvis, and trace associated free fluid. This is concerning for infectious or inflammatory ileitis.       Assessment/Plan 1. Enteritis:  This is new.  This is recurrent. 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 No SBO or intra-abdominal infection is seen on CAT scan. It is also possible that the patient has an acute infectious viral gastroenteritis. At this time her symptoms have resolved with IV fluids and ondansetron.   -Admit to MedSurg bed for serial abdominal exams -Zofran and morphine as needed -Continue gentle IV fluids for another 8 hours then advance diet as tolerated  2. HTN:  Controlled. -Continue propranolol and atorvastatin and aspirin      DVT PPx: SCDs Diet: Regular when tolerated Consultants: None Code Status: DO NOT RESUSCITATE Family Communication: The patient's diagnosis and plan for observation were discussed with her husband present at the bedside. All questions were answered. CODE STATUS was confirmed.  Medical decision making: What exists of the patient's previous chart was reviewed in depth and the case was discussed with Dr. Kasandra Knudsen. Patient seen 1:43 AM on 09/24/2015.  Disposition Plan:  Admit for observation. If symptoms remain resolved by lunchtime could discharge by this afternoon with ondansetron for future episodes.      Edwin Dada [facility] Pager [phone]

## 2015-09-25 DIAGNOSIS — I10 Essential (primary) hypertension: Secondary | ICD-10-CM | POA: Diagnosis not present

## 2015-09-25 DIAGNOSIS — N3281 Overactive bladder: Secondary | ICD-10-CM | POA: Diagnosis not present

## 2015-09-25 DIAGNOSIS — G309 Alzheimer's disease, unspecified: Secondary | ICD-10-CM | POA: Diagnosis not present

## 2015-09-25 DIAGNOSIS — K660 Peritoneal adhesions (postprocedural) (postinfection): Secondary | ICD-10-CM | POA: Diagnosis not present

## 2015-09-25 DIAGNOSIS — K520 Gastroenteritis and colitis due to radiation: Secondary | ICD-10-CM | POA: Diagnosis not present

## 2015-09-25 DIAGNOSIS — Z79899 Other long term (current) drug therapy: Secondary | ICD-10-CM | POA: Diagnosis not present

## 2015-09-25 DIAGNOSIS — E7800 Pure hypercholesterolemia, unspecified: Secondary | ICD-10-CM | POA: Diagnosis not present

## 2015-09-25 DIAGNOSIS — R112 Nausea with vomiting, unspecified: Secondary | ICD-10-CM | POA: Diagnosis not present

## 2015-09-25 DIAGNOSIS — K562 Volvulus: Secondary | ICD-10-CM | POA: Diagnosis not present

## 2015-09-25 DIAGNOSIS — Z7982 Long term (current) use of aspirin: Secondary | ICD-10-CM | POA: Diagnosis not present

## 2015-09-25 NOTE — Care Management Note (Signed)
Case Management Note  Patient Details  Name: Laura Perkins MRN: [id number] Date of Birth: 1935-05-05  Subjective/Objective:   acute infectious viral gastroenteritis                 Action/Plan: Chart reviewed. Explained Medicare Obs notification. Scheduled dc this evening if she tolerates dinner. Lives at home with husband, Mr Marrion Coy, no NCM needs identified.   Expected Discharge Date:  09/25/2015              Expected Discharge Plan:  Home/Self Care  In-House Referral:     Discharge planning Services  CM Consult   Status of Service:  Completed, signed off  Medicare Important Message Given:    Date Medicare IM Given:    Medicare IM give by:    Date Additional Medicare IM Given:    Additional Medicare Important Message give by:     If discussed at [place] of Stay Meetings, dates discussed:    Additional Comments:  Erenest Rasher, RN 09/25/2015, 3:48 PM

## 2015-09-25 NOTE — Discharge Summary (Signed)
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  Prior SBO 09/2012, 11/2012, 06/2013, 05/2014 Prior L Breast CA s/p lumpectomy/mastectomy Ca uterus stg IIs/p TAH/BSO 2010 HLD HTN Enteritis in the past-thought at some point to be related to XRT  She has mod-severe alzheimer's and is followed by Dr. Krista Blue of Neurology  Symptoms started yesterday afternoon around 5 PM and worsened to severe abdominal pain 9. On 11/11 Her symptoms have resolved this morning she has no pain whatsoever and is actually tolerating a diet of chicken and macaroni and cheese  No fever or chills no nausea no vomiting Has been at bedside tells me that she has some short-term memory loss and is for some long-term memories but otherwise is pretty sharp He also states that about 1 to 2 months ago she had episodic severe pain had an x-ray and was managed at home over the weekend even though she had a partial SBO and this seemed to resolve   She stayed in the hosptial overnight 09/24/15 and improved It was felt that she had clincially a Radiation enteritis and she was encouraged to follow up as an OP with Med-ONc or Rad-Onc for recommnedations    Accuchecks 4 times/day, Once in AM empty stomach and then before each meal. Log in all results and show them to  your Prim.MD in 3 days. If any glucose reading is under 80 or above 300 call your Prim MD immidiately. Follow Low glucose instructions for glucose under 80 as instructed.  For Heart failure patients - Check your Weight same time everyday, if you gain over 2 pounds, or you develop in leg swelling, experience more shortness of breath or chest pain, call your Primary MD immediately. Follow Cardiac Low Salt Diet and 1.5 lit/day fluid restriction.   On your next visit with your primary care physician please Get Medicines reviewed and adjusted.   Please request your Prim.MD to go over all Hospital Tests and Procedure/Radiological results at the follow up, please get all Hospital records sent to your Prim MD by signing hospital release before you go home.   If you experience worsening of your admission symptoms, develop shortness of breath, life threatening emergency, suicidal or homicidal thoughts you must seek medical attention immediately by calling 911 or calling your MD immediately if symptoms less severe.  You Must read complete instructions/literature along with all the possible adverse reactions/side effects for all the Medicines you take and that have been prescribed to you. Take any new Medicines after you have completely understood and accpet all the possible adverse reactions/side effects.   Do not drive, operating heavy machinery, perform activities at heights, swimming or participation in water activities or provide baby sitting services if your were admitted for syncope or siezures until you have seen by Primary MD or a Neurologist and  advised to do so again.  Do not drive when taking Pain medications.    Do not take more than prescribed Pain, Sleep and Anxiety Medications  Special Instructions: If you have smoked or chewed Tobacco in the last 2 yrs please stop smoking, stop any regular Alcohol and or any Recreational drug use.  Wear Seat belts while driving.   Please  note  You were cared for by a hospitalist during your hospital stay. If you have any questions about your discharge medications or the care you received while you were in the hospital after you are discharged, you can call the unit and asked to speak with the hospitalist on call if the hospitalist that took care of you is not available. Once you are discharged, your primary care physician will handle any further medical issues. Please note that NO REFILLS for any discharge medications will be authorized once you are discharged, as it is imperative that you return to your primary care physician (or establish a relationship with a primary care physician if you do not have one) for your aftercare needs so that they can reassess your need for medications and monitor your lab values.  Discharge Exam: Filed Vitals:   09/25/15 1330  BP: 112/63  Pulse: 65  Temp: 98 F (36.7 C)  Resp: 16   Alert pelasant tol diet no other issues  General: eomi ncat Cardiovascular: s1 s 2no m/r/g Respiratory: clear  abd benign nt nd   Discharge Instructions   Discharge Instructions    Diet - low sodium heart healthy    Complete by:  As directed      Discharge instructions    Complete by:  As directed   Stop your imodium and laxatives Stop propranolol See youer regular MD in about 1 week Get labs then Soft diet for about 1 week -inculdes soups, mashed potatoes etc etc     Increase activity slowly    Complete by:  As directed           Current Discharge Medication List    CONTINUE these medications which have NOT CHANGED   Details  aspirin 81 MG chewable tablet Chew 81 mg by mouth every morning.    atorvastatin (LIPITOR) 20 MG tablet Take 10-20 mg by mouth daily. Alternate 10 mg (1/2 tablet) with 20 mg (1 tablet ) every other day    gabapentin (NEURONTIN) 100 MG capsule Take 2 capsules (200 mg total) by mouth 4 (four) times daily. Qty: 240 capsule, Refills: 11    Multiple Vitamin (MULTIVITAMIN  WITH MINERALS) TABS Take 1 tablet by mouth daily.    carboxymethylcellulose (REFRESH PLUS) 0.5 % SOLN Place 1 drop into both eyes 3 (three) times daily as needed (for irritated eyes).    simethicone (GAS-X) 80 MG chewable tablet Chew 1 tablet (80 mg total) by mouth every 6 (six) hours as needed for flatulence. Qty: 30 tablet, Refills: 0      STOP taking these medications     loperamide (IMODIUM) 2 MG capsule      polyethylene glycol (MIRALAX / GLYCOLAX) packet      Probiotic Product (PROBIOTIC DAILY PO)      propranolol (INDERAL) 60 MG tablet      ondansetron (ZOFRAN) 4 MG tablet        Allergies  Allergen Reactions  . Sulfa Antibiotics Anaphylaxis      The results of significant diagnostics from this hospitalization (including imaging, microbiology, ancillary and laboratory) are listed below for reference.  Significant Diagnostic Studies: Dg Chest 2 View  09/24/2015  CLINICAL DATA:  Acute onset of generalized abdominal pain. Nausea, vomiting and diarrhea. Initial encounter. EXAM: CHEST  2 VIEW COMPARISON:  Chest radiograph performed 03/01/2015 FINDINGS: The lungs are well-aerated and clear. There is no evidence of focal opacification, pleural effusion or pneumothorax. The heart is normal in size; the mediastinal contour is within normal limits. No acute osseous abnormalities are seen. Scattered clips are seen overlying the left breast. IMPRESSION: No acute cardiopulmonary process seen. Electronically Signed   By: Garald Balding M.D.   On: 09/24/2015 00:33   Ct Abdomen Pelvis W Contrast  09/24/2015  CLINICAL DATA:  Acute onset of nausea, vomiting and diarrhea. Generalized abdominal pain. Leukocytosis. Initial encounter. EXAM: CT ABDOMEN AND PELVIS WITH CONTRAST TECHNIQUE: Multidetector CT imaging of the abdomen and pelvis was performed using the standard protocol following bolus administration of intravenous contrast. CONTRAST:  167mL OMNIPAQUE IOHEXOL 300 MG/ML  SOLN  COMPARISON:  CT of the abdomen and pelvis from 03/01/2015 FINDINGS: The visualized lung bases are clear. The liver and spleen are unremarkable in appearance. The gallbladder is within normal limits. The pancreas and adrenal glands are unremarkable. The kidneys are unremarkable in appearance. There is no evidence of hydronephrosis. No renal or ureteral stones are seen. No perinephric stranding is appreciated. There is slightly increased enhancement of the wall of the mid to distal ileum, with a relatively long segment of ileal wall thickening within the pelvis, and trace associated free fluid. This is concerning for infectious or inflammatory ileitis. The stomach is within normal limits. No acute vascular abnormalities are seen. The appendix is not definitely seen; there is no evidence of appendicitis. The colon is unremarkable in appearance. The bladder is mildly distended and grossly unremarkable. The patient is status post hysterectomy. No suspicious adnexal masses are seen. No inguinal lymphadenopathy is seen. No acute osseous abnormalities are identified. There is grade 1 anterolisthesis of L4 on L5, and of L5 on S1, reflecting underlying facet disease. IMPRESSION: Slightly increased enhancement of the wall of the mid to distal ileum, with a relatively long segment of ileal wall thickening within the pelvis, and trace associated free fluid. This is concerning for infectious or inflammatory ileitis. Electronically Signed   By: Garald Balding M.D.   On: 09/24/2015 00:23    Microbiology: No results found for this or any previous visit (from the past 240 hour(s)).   Labs: Basic Metabolic Panel:  Recent Labs Lab 09/23/15 2224  NA 138  K 4.2  CL 100*  CO2 27  GLUCOSE 166*  BUN 16  CREATININE 0.98  CALCIUM 10.1   Liver Function Tests:  Recent Labs Lab 09/23/15 2224  AST 23  ALT 15  ALKPHOS 70  BILITOT 1.1  PROT 7.2  ALBUMIN 4.3    Recent Labs Lab 09/23/15 2224  LIPASE 44   No  results for input(s): AMMONIA in the last 168 hours. CBC:  Recent Labs Lab 09/23/15 2224 09/24/15 0420  WBC 14.3* 11.5*  HGB 13.7 11.1*  HCT 40.8 33.8*  MCV 96.9 96.6  PLT 314 247   Cardiac Enzymes: No results for input(s): CKTOTAL, CKMB, CKMBINDEX, TROPONINI in the last 168 hours. BNP: BNP (last 3 results) No results for input(s): BNP in the last 8760 hours.  ProBNP (last 3 results) No results for input(s): PROBNP in the last 8760 hours.  CBG: No results for input(s): GLUCAP in the last 168 hours.     Signed:  Nita Sells  [facility] 09/25/2015, 6:28 PM

## 2015-09-25 NOTE — Care Management Obs Status (Signed)
[facility] NOTIFICATION   Patient Details  Name: Laura Perkins MRN: [id number] Date of Birth: May 16, 1935   Medicare Observation Status Notification Given:  Yes    Erenest Rasher, RN 09/25/2015, 3:46 PM

## 2015-09-26 ENCOUNTER — Emergency Department (HOSPITAL_COMMUNITY)
Admission: EM | Admit: 2015-09-26 | Discharge: 2015-09-26 | Disposition: A | Discharge status: Home / Self Care | Payer: Commercial Managed Care - HMO | Attending: Emergency Medicine | Admitting: Emergency Medicine

## 2015-09-26 ENCOUNTER — Emergency Department (HOSPITAL_COMMUNITY): Payer: Commercial Managed Care - HMO

## 2015-09-26 ENCOUNTER — Encounter (HOSPITAL_COMMUNITY): Payer: Self-pay | Admitting: Emergency Medicine

## 2015-09-26 DIAGNOSIS — Z79899 Other long term (current) drug therapy: Secondary | ICD-10-CM | POA: Insufficient documentation

## 2015-09-26 DIAGNOSIS — G309 Alzheimer's disease, unspecified: Secondary | ICD-10-CM | POA: Diagnosis not present

## 2015-09-26 DIAGNOSIS — Z8719 Personal history of other diseases of the digestive system: Secondary | ICD-10-CM | POA: Insufficient documentation

## 2015-09-26 DIAGNOSIS — R1030 Lower abdominal pain, unspecified: Secondary | ICD-10-CM

## 2015-09-26 DIAGNOSIS — I10 Essential (primary) hypertension: Secondary | ICD-10-CM | POA: Diagnosis not present

## 2015-09-26 DIAGNOSIS — R1033 Periumbilical pain: Secondary | ICD-10-CM | POA: Insufficient documentation

## 2015-09-26 DIAGNOSIS — Z853 Personal history of malignant neoplasm of breast: Secondary | ICD-10-CM | POA: Diagnosis not present

## 2015-09-26 DIAGNOSIS — Z8541 Personal history of malignant neoplasm of cervix uteri: Secondary | ICD-10-CM | POA: Diagnosis not present

## 2015-09-26 DIAGNOSIS — Z3202 Encounter for pregnancy test, result negative: Secondary | ICD-10-CM | POA: Diagnosis not present

## 2015-09-26 DIAGNOSIS — E7800 Pure hypercholesterolemia, unspecified: Secondary | ICD-10-CM | POA: Diagnosis not present

## 2015-09-26 DIAGNOSIS — Z7982 Long term (current) use of aspirin: Secondary | ICD-10-CM | POA: Diagnosis not present

## 2015-09-26 DIAGNOSIS — Z90710 Acquired absence of both cervix and uterus: Secondary | ICD-10-CM | POA: Insufficient documentation

## 2015-09-26 DIAGNOSIS — R1032 Left lower quadrant pain: Secondary | ICD-10-CM | POA: Diagnosis not present

## 2015-09-26 DIAGNOSIS — R140 Abdominal distension (gaseous): Secondary | ICD-10-CM | POA: Diagnosis not present

## 2015-09-26 LAB — CBC
HCT: 40.5 % (ref 36.0–46.0)
HEMOGLOBIN: 13.3 g/dL (ref 12.0–15.0)
MCH: 32.7 pg (ref 26.0–34.0)
MCHC: 32.8 g/dL (ref 30.0–36.0)
MCV: 99.5 fL (ref 78.0–100.0)
PLATELETS: 304 10*3/uL (ref 150–400)
RBC: 4.07 MIL/uL (ref 3.87–5.11)
RDW: 13.3 % (ref 11.5–15.5)
WBC: 8.5 10*3/uL (ref 4.0–10.5)

## 2015-09-26 LAB — COMPREHENSIVE METABOLIC PANEL
ALBUMIN: 3.8 g/dL (ref 3.5–5.0)
ALK PHOS: 66 U/L (ref 38–126)
ALT: 13 U/L — AB (ref 14–54)
AST: 22 U/L (ref 15–41)
Anion gap: 11 (ref 5–15)
BUN: 11 mg/dL (ref 6–20)
CALCIUM: 9.6 mg/dL (ref 8.9–10.3)
CO2: 25 mmol/L (ref 22–32)
CREATININE: 0.83 mg/dL (ref 0.44–1.00)
Chloride: 108 mmol/L (ref 101–111)
GFR calc Af Amer: >60 mL/min (ref >60)
GFR calc non Af Amer: >60 mL/min (ref >60)
GLUCOSE: 148 mg/dL — AB (ref 65–99)
Potassium: 3.9 mmol/L (ref 3.5–5.1)
SODIUM: 144 mmol/L (ref 135–145)
Total Bilirubin: 1.2 mg/dL (ref 0.3–1.2)
Total Protein: 7 g/dL (ref 6.5–8.1)

## 2015-09-26 LAB — URINE MICROSCOPIC-ADD ON

## 2015-09-26 LAB — URINALYSIS, ROUTINE W REFLEX MICROSCOPIC
Glucose, UA: NEGATIVE mg/dL
Ketones, ur: 15 mg/dL — AB
Leukocytes, UA: NEGATIVE
Nitrite: NEGATIVE
PH: 5.5 (ref 5.0–8.0)
PROTEIN: 30 mg/dL — AB
Specific Gravity, Urine: 1.03 (ref 1.005–1.030)
Urobilinogen, UA: 1 mg/dL (ref 0.0–1.0)

## 2015-09-26 LAB — I-STAT BETA HCG BLOOD, ED (MC, WL, AP ONLY): I-stat hCG, quantitative: 9.8 m[IU]/mL — ABNORMAL HIGH (ref <5)

## 2015-09-26 LAB — LIPASE, BLOOD: Lipase: 28 U/L (ref 11–51)

## 2015-09-26 LAB — I-STAT CG4 LACTIC ACID, ED: Lactic Acid, Venous: 0.8 mmol/L (ref 0.5–2.0)

## 2015-09-26 MED ORDER — SODIUM CHLORIDE 0.9 % IV BOLUS (SEPSIS)
500.0000 mL | Freq: Once | INTRAVENOUS | Status: AC
  Administered 2015-09-26: 500 mL via INTRAVENOUS

## 2015-09-26 MED ORDER — HYDROCODONE-ACETAMINOPHEN 5-325 MG PO TABS: 0.5000 | ORAL_TABLET | ORAL | Status: DC | PRN

## 2015-09-26 MED ORDER — ACETAMINOPHEN 325 MG PO TABS
650.0000 mg | ORAL_TABLET | Freq: Once | ORAL | Status: AC
  Administered 2015-09-26: 650 mg via ORAL
  Filled 2015-09-26: qty 2

## 2015-09-26 NOTE — ED Provider Notes (Signed)
CSN: [id number]     Arrival date & time 09/26/15  1116 History   First MD Initiated Contact with Patient 09/26/15 1420     Chief Complaint  Patient presents with  . Abdominal Pain     (Consider location/radiation/quality/duration/timing/severity/associated sxs/prior Treatment) HPI   Laura Perkins is a 80 y.o. female with PMH significant for enteritis secondary to radiation therapy with recurrent partial SBO, abdominal adhesions, and HTN who was d/c yesterday from [facility] for N/V and abdominal pain after negative CT and felt it was secondary to resolution of SBO who presents with lower abdominal pain and RLQ pain that began approximately 4:30 AM this morning.  Constant.  Aching, sharp.  Nothing tried PTA.   Assoc sxs include nonbloody diarrhea.  Denies N/V, fever, CP, SOB, or urinary symptoms.   Past Medical History  Diagnosis Date  . Hypertension   . Overactive bladder   . 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 High cholesterol   . Ileus ([place])   . Abdominal adhesions   . Carcinosarcoma of uterus s/p TAH BSO 2010 11/22/2012    REPORT OF SURGICAL PATHOLOGY  Case #: [id number] Patient Name: LIAHONA, DESLAURIERS Office Chart Number: N/A  MRN: [id number] Pathologist: Vonna Kotyk B. Lyndon Code, MD DOB/Age [birth year] (Age: 39) Gender: F Date Taken: 07/28/2009 Date Received: 07/28/2009  FINAL DIAGNOSIS  MICROSCOPIC EXAMINATION AND DIAGNOSIS  UTERUS, OVARIES AND FALLOPIAN TUBES, HYSTERECTOMY AND BILATERAL SALPINGO-OOPHORECTOMY: - CARCINOSARCOMA, ARISING FROM THE LOWER UTERINE SEGMENT, SPANNING 4.5 CM. - CARCINOSARCOMA IS 0.1 CM TO THE DEEP SOFT TISSUE RESECTION MARGIN. - CERVIX: ATROPHY AND CHRONIC INFLAMMATION. - ENDOMETRIUM: ATROPHIC APPEARING ENDOMETRIUM. - MYOMETRIUM: LEIOMYOMATA. - SEROSA: ESSENTIALLY UNREMARKABLE. - ADNEXA: PARATUBAL CYST  COMMENT Immunohistochemical stains were performed on the malignant neoplasm with the following results. The controls stained appropriately.  P16 Positive  Estrogen receptor Scattered positive cells Progesterone receptor Scattered positive cells Cytokeratin AE1/AE3 Positive in epithe  . 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 Alzheimer's disease with memory loss 06/25/2013   Past Surgical History  Procedure Laterality Date  . Abdominal hysterectomy  2010    Dr Christophe Louis  . Mastectomy partial / lumpectomy Left 1990    Breast sarcoma  . Ankle surgery Left   . Hernia repair      as a child   Family History  Problem Relation Age of Onset  . Cancer Father     pancreatic    Social History  Substance Use Topics  . Smoking status: Never Smoker   . Smokeless tobacco: Never Used  . Alcohol Use: No     Comment: occasional years    OB History    No data available     Review of Systems All other systems negative unless otherwise stated in HPI    Allergies  Sulfa antibiotics  Home Medications   Prior to Admission medications   Medication Sig Start Date End Date Taking? Authorizing Provider  aspirin 81 MG chewable tablet Chew 81 mg by mouth every morning.   Yes Historical Provider, MD  atorvastatin (LIPITOR) 20 MG tablet Take 10-20 mg by mouth daily. Alternate 10 mg (1/2 tablet) with 20 mg (1 tablet ) every other day 07/28/12  Yes Historical Provider, MD  carboxymethylcellulose (REFRESH PLUS) 0.5 % SOLN Place 1 drop into both eyes 3 (three) times daily as needed (for irritated eyes).   Yes Historical Provider, MD  gabapentin (NEURONTIN) 100 MG capsule Take 2 capsules (200 mg  total) by mouth 4 (four) times daily. 10/11/14  Yes Marcial Pacas, MD  Multiple Vitamin (MULTIVITAMIN WITH MINERALS) TABS Take 1 tablet by mouth daily.   Yes Historical Provider, MD  propranolol (INDERAL) 60 MG tablet Take 60 mg by mouth 2 (two) times daily.   Yes Historical Provider, MD  simethicone (GAS-X) 80 MG chewable tablet Chew 1 tablet (80 mg total) by mouth every 6 (six) hours as needed for flatulence. 11/24/12  Yes Robbie Lis, MD  HYDROcodone-acetaminophen (NORCO/VICODIN) 5-325 MG tablet Take 0.5 tablets by mouth every 4 (four) hours as needed. 09/26/15   Bibi Economos, PA-C   BP 133/73 mmHg  Pulse 69  Temp(Src) 98.2 F (36.8 C) (Oral)  Resp 17  SpO2 100% Physical Exam  Constitutional: She is oriented to person, place, and time. She appears well-developed and well-nourished.  Elderly appearing female resting comfortably in the bed.  HENT:  Head: Normocephalic and atraumatic.  Mouth/Throat: Oropharynx is clear and moist.  Eyes: Conjunctivae are normal. Pupils are equal, round, and reactive to light.  Neck: Normal range of motion. Neck supple.  Cardiovascular: Normal rate, regular rhythm and normal heart sounds.   No murmur heard. Pulmonary/Chest: Effort normal and breath sounds normal. No accessory muscle usage or stridor. No respiratory distress. She has no wheezes. She has no rhonchi. She has no rales.  Abdominal: Soft. Bowel sounds are normal. She exhibits no distension. There is tenderness in the periumbilical area and left lower quadrant. There is no rebound and no guarding.  Musculoskeletal: Normal range of motion.  Lymphadenopathy:    She has no cervical adenopathy.  Neurological: She is alert and oriented to person, place, and time.  Speech clear without dysarthria.  Skin: Skin is warm and dry.  Psychiatric: She has a normal mood and affect. Her behavior is normal.    ED Course  Procedures (including critical care time) Labs Review Labs Reviewed  COMPREHENSIVE METABOLIC PANEL - Abnormal; Notable for the following:    Glucose, Bld 148 (*)    ALT 13 (*)    All other components within normal limits  URINALYSIS, ROUTINE W REFLEX MICROSCOPIC (NOT AT [facility]) - Abnormal; Notable for the following:    Color, Urine AMBER (*)    APPearance CLOUDY (*)    Hgb urine dipstick TRACE (*)    Bilirubin Urine SMALL (*)    Ketones, ur 15 (*)    Protein, ur 30 (*)    All other components within normal  limits  URINE MICROSCOPIC-ADD ON - Abnormal; Notable for the following:    Bacteria, UA MANY (*)    All other components within normal limits  I-STAT BETA HCG BLOOD, ED ([facility], [facility], [facility] ONLY) - Abnormal; Notable for the following:    I-stat hCG, quantitative 9.8 (*)    All other components within normal limits  LIPASE, BLOOD  CBC  I-STAT CG4 LACTIC ACID, ED    Imaging Review Dg Abd Acute W/chest  09/26/2015  CLINICAL DATA:  Lower abdominal pain, right-sided for 1 day. Diarrhea for 3 days. Remote history of uterine and breast cancer. EXAM: DG ABDOMEN ACUTE W/ 1V CHEST COMPARISON:  Chest radiograph 09/24/2015. Plain film abdomen of 07/15/2015. FINDINGS: Frontal view of the chest demonstrates surgical clips projecting over the left lateral breast. Midline trachea. Normal heart size. Tortuous thoracic aorta. No pleural effusion or pneumothorax. Mild biapical pleural thickening. Clear lungs. Abdominal films demonstrate scattered small bowel air-fluid levels within the left side of the abdomen on upright positioning.  Small bowel loops measuring upper normal, up to 3.0 cm. Contrast within normal caliber colon, presumably from the CT of 09/24/2015. No abnormal abdominal calcifications. IMPRESSION: Borderline small bowel dilatation with minimal air-fluid levels within. Favor mild adynamic ileus. Low-grade partial small bowel obstruction felt less likely. No acute cardiopulmonary disease. Electronically Signed   By: Abigail Miyamoto M.D.   On: 09/26/2015 15:20   I have personally reviewed and evaluated these images and lab results as part of my medical decision-making.   EKG Interpretation None      MDM   Final diagnoses:  Lower abdominal pain    Patient with recurrent enteritis and SBO recently discharged for N/V and abdominal pain.  Abdominal CT showed slightly increased enhancement of mid to distal ileum.  Patient observed overnight, with symptom improvement and was discharged yesterday.  She returns  today with lower abdominal pain and RLQ pain.  Denies N/V.  Endorses nonbloody diarrhea.  VSS, NAD, non-toxic appearing.  On exam, heart RRR, lungs CTAB, abdomen soft with tenderness in RLQ and periumbilical region.    UA, lipase, CMP, CBC, lactate unremarkable.  AXR shows borderline small bowel dilatation with minimal air-fluid levels.  Favor mild adynamic ileus.  Low-grade partial small bowel obstruction less likely.   Case has been discussed with and seen by Dr. Jeneen Rinks who agrees with the above plan for discharge. Dr. Jeneen Rinks discussed options of re-admission to the hospital versus outpatient management.  After discussion of risks and benefits, patient and caregiver will manage pain at home.  Discussed return precautions.  Will d/c home with vicodin.   Evaluation does not show pathology requring ongoing emergent intervention or admission. Pt is hemodynamically stable and mentating appropriately. Discussed findings/results and plan with patient/guardian, who agrees with plan. All questions answered. Return precautions discussed and outpatient follow up given.    Gloriann Loan, PA-C 09/26/15 1634  Tanna Furry, MD 10/05/15 1239

## 2015-09-26 NOTE — ED Notes (Signed)
Pt was admitted to hospital on Friday for gastroenteritis and released yesterday. Pt c/o RLQ pain since last night.  Pt states nausea but denies v/d.

## 2015-09-26 NOTE — ED Notes (Signed)
Patient transported to X-ray 

## 2015-09-26 NOTE — Discharge Instructions (Signed)

## 2015-10-11 DIAGNOSIS — G629 Polyneuropathy, unspecified: Secondary | ICD-10-CM | POA: Diagnosis not present

## 2015-10-11 DIAGNOSIS — K5669 Other intestinal obstruction: Secondary | ICD-10-CM | POA: Diagnosis not present

## 2015-11-05 ENCOUNTER — Other Ambulatory Visit: Payer: Self-pay | Admitting: Neurology

## 2015-12-19 ENCOUNTER — Other Ambulatory Visit: Payer: Self-pay | Admitting: Neurology

## 2016-01-17 ENCOUNTER — Emergency Department (HOSPITAL_COMMUNITY): Payer: Commercial Managed Care - HMO

## 2016-01-17 ENCOUNTER — Emergency Department (HOSPITAL_COMMUNITY)
Admission: EM | Admit: 2016-01-17 | Discharge: 2016-01-17 | Disposition: A | Discharge status: Home / Self Care | Payer: Commercial Managed Care - HMO | Attending: Emergency Medicine | Admitting: Emergency Medicine

## 2016-01-17 ENCOUNTER — Encounter (HOSPITAL_COMMUNITY): Payer: Self-pay

## 2016-01-17 DIAGNOSIS — R112 Nausea with vomiting, unspecified: Secondary | ICD-10-CM | POA: Diagnosis not present

## 2016-01-17 DIAGNOSIS — G309 Alzheimer's disease, unspecified: Secondary | ICD-10-CM | POA: Diagnosis not present

## 2016-01-17 DIAGNOSIS — I10 Essential (primary) hypertension: Secondary | ICD-10-CM | POA: Diagnosis not present

## 2016-01-17 DIAGNOSIS — F0280 Dementia in other diseases classified elsewhere without behavioral disturbance: Secondary | ICD-10-CM | POA: Diagnosis not present

## 2016-01-17 DIAGNOSIS — R1084 Generalized abdominal pain: Secondary | ICD-10-CM | POA: Diagnosis present

## 2016-01-17 DIAGNOSIS — Z85830 Personal history of malignant neoplasm of bone: Secondary | ICD-10-CM | POA: Insufficient documentation

## 2016-01-17 DIAGNOSIS — Z7982 Long term (current) use of aspirin: Secondary | ICD-10-CM | POA: Insufficient documentation

## 2016-01-17 DIAGNOSIS — K529 Noninfective gastroenteritis and colitis, unspecified: Secondary | ICD-10-CM | POA: Diagnosis not present

## 2016-01-17 DIAGNOSIS — E7800 Pure hypercholesterolemia, unspecified: Secondary | ICD-10-CM | POA: Insufficient documentation

## 2016-01-17 DIAGNOSIS — Z9889 Other specified postprocedural states: Secondary | ICD-10-CM | POA: Diagnosis not present

## 2016-01-17 DIAGNOSIS — Z8542 Personal history of malignant neoplasm of other parts of uterus: Secondary | ICD-10-CM | POA: Diagnosis not present

## 2016-01-17 DIAGNOSIS — Z79899 Other long term (current) drug therapy: Secondary | ICD-10-CM | POA: Insufficient documentation

## 2016-01-17 DIAGNOSIS — Z87448 Personal history of other diseases of urinary system: Secondary | ICD-10-CM | POA: Insufficient documentation

## 2016-01-17 DIAGNOSIS — Z90710 Acquired absence of both cervix and uterus: Secondary | ICD-10-CM | POA: Diagnosis not present

## 2016-01-17 DIAGNOSIS — K760 Fatty (change of) liver, not elsewhere classified: Secondary | ICD-10-CM | POA: Diagnosis not present

## 2016-01-17 LAB — URINALYSIS, ROUTINE W REFLEX MICROSCOPIC
Bilirubin Urine: NEGATIVE
Glucose, UA: NEGATIVE mg/dL
Hgb urine dipstick: NEGATIVE
KETONES UR: 40 mg/dL — AB
LEUKOCYTES UA: NEGATIVE
NITRITE: NEGATIVE
PH: 6 (ref 5.0–8.0)
PROTEIN: NEGATIVE mg/dL
Specific Gravity, Urine: >1.046 — ABNORMAL HIGH (ref 1.005–1.030)

## 2016-01-17 LAB — COMPREHENSIVE METABOLIC PANEL
ALT: 20 U/L (ref 14–54)
ANION GAP: 12 (ref 5–15)
AST: 26 U/L (ref 15–41)
Albumin: 4.4 g/dL (ref 3.5–5.0)
Alkaline Phosphatase: 66 U/L (ref 38–126)
BILIRUBIN TOTAL: 1 mg/dL (ref 0.3–1.2)
BUN: 17 mg/dL (ref 6–20)
CO2: 23 mmol/L (ref 22–32)
Calcium: 9.8 mg/dL (ref 8.9–10.3)
Chloride: 105 mmol/L (ref 101–111)
Creatinine, Ser: 0.85 mg/dL (ref 0.44–1.00)
GFR calc Af Amer: >60 mL/min (ref >60)
Glucose, Bld: 178 mg/dL — ABNORMAL HIGH (ref 65–99)
POTASSIUM: 3.8 mmol/L (ref 3.5–5.1)
Sodium: 140 mmol/L (ref 135–145)
TOTAL PROTEIN: 7.1 g/dL (ref 6.5–8.1)

## 2016-01-17 LAB — CBC
HEMATOCRIT: 40.4 % (ref 36.0–46.0)
Hemoglobin: 13.3 g/dL (ref 12.0–15.0)
MCH: 32.3 pg (ref 26.0–34.0)
MCHC: 32.9 g/dL (ref 30.0–36.0)
MCV: 98.1 fL (ref 78.0–100.0)
Platelets: 315 10*3/uL (ref 150–400)
RBC: 4.12 MIL/uL (ref 3.87–5.11)
RDW: 13.3 % (ref 11.5–15.5)
WBC: 12.3 10*3/uL — AB (ref 4.0–10.5)

## 2016-01-17 LAB — LIPASE, BLOOD: Lipase: 33 U/L (ref 11–51)

## 2016-01-17 MED ORDER — FENTANYL CITRATE (PF) 100 MCG/2ML IJ SOLN
50.0000 ug | Freq: Once | INTRAMUSCULAR | Status: AC
  Administered 2016-01-17: 50 ug via INTRAVENOUS
  Filled 2016-01-17: qty 2

## 2016-01-17 MED ORDER — ONDANSETRON HCL 4 MG/2ML IJ SOLN
4.0000 mg | Freq: Once | INTRAMUSCULAR | Status: AC
  Administered 2016-01-17: 4 mg via INTRAVENOUS

## 2016-01-17 MED ORDER — IOHEXOL 300 MG/ML  SOLN
100.0000 mL | Freq: Once | INTRAMUSCULAR | Status: AC | PRN
  Administered 2016-01-17: 100 mL via INTRAVENOUS

## 2016-01-17 MED ORDER — SODIUM CHLORIDE 0.9 % IV BOLUS (SEPSIS)
500.0000 mL | Freq: Once | INTRAVENOUS | Status: AC
  Administered 2016-01-17: 500 mL via INTRAVENOUS

## 2016-01-17 MED ORDER — ONDANSETRON HCL 4 MG/2ML IJ SOLN
4.0000 mg | Freq: Once | INTRAMUSCULAR | Status: DC | PRN
  Filled 2016-01-17: qty 2

## 2016-01-17 MED ORDER — IOHEXOL 300 MG/ML  SOLN
50.0000 mL | Freq: Once | INTRAMUSCULAR | Status: AC | PRN
  Administered 2016-01-17: 50 mL via ORAL

## 2016-01-17 MED ORDER — HYDROCODONE-ACETAMINOPHEN 5-325 MG PO TABS: 0.5000 | ORAL_TABLET | ORAL | Status: DC | PRN

## 2016-01-17 MED ORDER — ONDANSETRON 4 MG PO TBDP: 4.0000 mg | ORAL_TABLET | Freq: Three times a day (TID) | ORAL | Status: DC | PRN

## 2016-01-17 NOTE — ED Notes (Signed)
Pt complains of abdominal pain, vomiting and diarrhea for two days, hx of blockages

## 2016-01-17 NOTE — ED Notes (Signed)
Pt aware that a urine sample is needed. Pt sts she has been drinking but is currently drinking contrast and says she will press call bell when she needs to go to the restroom.

## 2016-01-17 NOTE — Discharge Instructions (Signed)
As discussed, you have been diagnosed with enteritis, or inflammation of the bowels. It is currently stable hydrated, monitor your condition carefully, and do not hesitate to return here if you develop new, or concerning changes.  Of those, please be sure to follow-up with your primary care physician.

## 2016-01-17 NOTE — ED Provider Notes (Signed)
CSN: [id number]     Arrival date & time 01/17/16  0255 History   By signing my name below, I, Laura Perkins, attest that this documentation has been prepared under the direction and in the presence of Laura Muskrat, MD. Electronically Signed: Eustaquio Perkins, ED Scribe. 01/17/2016. 3:53 AM.   Chief Complaint  Patient presents with  . Abdominal Pain   The history is provided by the patient and a relative. No language interpreter was used.     HPI Comments: [name] is a 81 y.o. female who presents to the Emergency Department complaining of gradual onset, constant, diffuse abdominal pain x 1 day. Pt also complains of nausea, dry heaving, and diarrhea. Pt has hx of multiple ileus obstruction. Denies fever, confusion, or any other associated symptoms.   Past Medical History  Diagnosis Date  . Hypertension   . Overactive bladder   . 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 High cholesterol   . Ileus ([place])   . Abdominal adhesions   . Carcinosarcoma of uterus s/p TAH BSO 2010 11/22/2012    REPORT OF SURGICAL PATHOLOGY  Case #: [id number] Patient Name: Laura Perkins, Laura Perkins Office Chart Number: N/A  MRN: [id number] Pathologist: Vonna Kotyk B. Lyndon Code, MD DOB/Age 13-Apr-1935 (Age: 6) Gender: F Date Taken: 07/28/2009 Date Received: 07/28/2009  FINAL DIAGNOSIS  MICROSCOPIC EXAMINATION AND DIAGNOSIS  UTERUS, OVARIES AND FALLOPIAN TUBES, HYSTERECTOMY AND BILATERAL SALPINGO-OOPHORECTOMY: - CARCINOSARCOMA, ARISING FROM THE LOWER UTERINE SEGMENT, SPANNING 4.5 CM. - CARCINOSARCOMA IS 0.1 CM TO THE DEEP SOFT TISSUE RESECTION MARGIN. - CERVIX: ATROPHY AND CHRONIC INFLAMMATION. - ENDOMETRIUM: ATROPHIC APPEARING ENDOMETRIUM. - MYOMETRIUM: LEIOMYOMATA. - SEROSA: ESSENTIALLY UNREMARKABLE. - ADNEXA: PARATUBAL CYST  COMMENT Immunohistochemical stains were performed on the malignant neoplasm with the following results. The controls stained appropriately.  P16 Positive Estrogen receptor Scattered positive cells  Progesterone receptor Scattered positive cells Cytokeratin AE1/AE3 Positive in epithe  . 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 Alzheimer's disease with memory loss 06/25/2013   Past Surgical History  Procedure Laterality Date  . Abdominal hysterectomy  2010    Dr Christophe Louis  . Mastectomy partial / lumpectomy Left 1990    Breast sarcoma  . Ankle surgery Left   . Hernia repair      as a child   Family History  Problem Relation Age of Onset  . Cancer Father     pancreatic    Social History  Substance Use Topics  . Smoking status: Never Smoker   . Smokeless tobacco: Never Used  . Alcohol Use: No     Comment: occasional years    OB History    No data available     Review of Systems  Unable to perform ROS: Dementia  Constitutional: Negative for fever.  Gastrointestinal: Positive for nausea, abdominal pain and diarrhea.  Psychiatric/Behavioral: Negative for confusion.   Allergies  Sulfa antibiotics  Home Medications   Prior to Admission medications   Medication Sig Start Date End Date Taking? Authorizing Provider  aspirin 81 MG chewable tablet Chew 81 mg by mouth every morning.    Historical Provider, MD  atorvastatin (LIPITOR) 20 MG tablet Take 10-20 mg by mouth daily. Alternate 10 mg (1/2 tablet) with 20 mg (1 tablet ) every other day 07/28/12   Historical Provider, MD  carboxymethylcellulose (REFRESH PLUS) 0.5 % SOLN Place 1 drop into both eyes 3 (three) times daily as needed (for irritated eyes).    Historical Provider,  MD  gabapentin (NEURONTIN) 100 MG capsule TAKE 2 CAPSULES (200 MG TOTAL) BY MOUTH 4 (FOUR) TIMES DAILY. 12/20/15   Marcial Pacas, MD  HYDROcodone-acetaminophen (NORCO/VICODIN) 5-325 MG tablet Take 0.5 tablets by mouth every 4 (four) hours as needed. 09/26/15   Gloriann Loan, PA-C  Multiple Vitamin (MULTIVITAMIN WITH MINERALS) TABS Take 1 tablet by mouth daily.    Historical Provider, MD  propranolol (INDERAL) 60 MG tablet  Take 60 mg by mouth 2 (two) times daily.    Historical Provider, MD  simethicone (GAS-X) 80 MG chewable tablet Chew 1 tablet (80 mg total) by mouth every 6 (six) hours as needed for flatulence. 11/24/12   Robbie Lis, MD   BP 112/90 mmHg  Pulse 84  Temp(Src) 97.5 F (36.4 C) (Oral)  Resp 20  SpO2 100%   Physical Exam  Constitutional: She has a sickly appearance.  HENT:  Head: Normocephalic and atraumatic.  Eyes: Conjunctivae are normal. Right eye exhibits no discharge. Left eye exhibits no discharge.  Neck: No tracheal deviation present.  Cardiovascular: Normal rate, regular rhythm and intact distal pulses.   Pulmonary/Chest: Effort normal. No stridor. No respiratory distress.  Abdominal:    Musculoskeletal: She exhibits no edema.  Neurological: She is alert. She displays atrophy. No cranial nerve deficit.  Skin: Skin is warm.  Psychiatric: She is withdrawn. Cognition and memory are impaired.    ED Course  Procedures (including critical care time)  DIAGNOSTIC STUDIES: Oxygen Saturation is 100% on RA, normal by my interpretation.    COORDINATION OF CARE: 3:53 AM-Discussed treatment plan which includes  CT A/P with pt at bedside and pt agreed to plan.   Labs Review Labs Reviewed  COMPREHENSIVE METABOLIC PANEL - Abnormal; Notable for the following:    Glucose, Bld 178 (*)    All other components within normal limits  CBC - Abnormal; Notable for the following:    WBC 12.3 (*)    All other components within normal limits  URINALYSIS, ROUTINE W REFLEX MICROSCOPIC (NOT AT [facility]) - Abnormal; Notable for the following:    Specific Gravity, Urine >1.046 (*)    Ketones, ur 40 (*)    All other components within normal limits  LIPASE, BLOOD    Imaging Review Ct Abdomen Pelvis W Contrast  01/17/2016  CLINICAL DATA:  Diffuse abdominal pain for 1 day. Nausea and diarrhea. Previous abdominal surgery. EXAM: CT ABDOMEN AND PELVIS WITH CONTRAST TECHNIQUE: Multidetector CT imaging of  the abdomen and pelvis was performed using the standard protocol following bolus administration of intravenous contrast. CONTRAST:  155mL OMNIPAQUE IOHEXOL 300 MG/ML  SOLN COMPARISON:  09/24/2015 FINDINGS: Atelectasis in the lung bases. Mild diffuse fatty infiltration of the liver. Small amount of free fluid around the liver. The gallbladder, pancreas, spleen, adrenal glands, kidneys, abdominal aorta, inferior vena cava, and retroperitoneal lymph nodes are unremarkable. Stomach appears normal. Small bowel are upper limits of normal diameter at are diffusely fluid-filled with enhancing mildly thickened wall. This is most likely to represent enteritis. The appearance is similar to the previous study. Small amount of free fluid in the mesentery. No free air in the abdomen. Pelvis: Appendix is not identified. Small amount of free fluid in the pelvis. Uterus is surgically absent. No bladder wall thickening. No pelvic mass or lymphadenopathy. Degenerative changes in the spine. No destructive bone lesions. Slight anterior subluxation at L4-5 and L5-S1 is likely degenerative. IMPRESSION: Prominent nondistended fluid-filled small bowel with mild wall thickening. Changes most consistent with enteritis. Appearance is  similar to previous study. Mild diffuse fatty infiltration of the liver. Small amount of free fluid in the abdomen, pelvis, and mesentery, likely reactive. Electronically Signed   By: Lucienne Capers M.D.   On: 01/17/2016 06:05   I have personally reviewed and evaluated these images and lab results as part of my medical decision-making. Update:, Patient ambulatory, in no distress. On repeat evaluation in the room, she appears calm, no ongoing complaints. Chart review head notable for demonstration of prior episodes of enteritis, with suspicion for radiation related effects.    MDM      I personally performed the services described in this documentation, which was scribed in my presence. The recorded  information has been reviewed and is accurate.     elderly female with a history of cancer, including radiation therapy now presents with ongoing abdominal pain. Here, the patient is awake, alert, afebrile. Patient has dementia, but is otherwise interacting appropriately. Patient has resolution of pain here. CT and cystoscopy with enteritis, which the patient has had on prior evaluations, and there is some suspicion for radiation associated effects.  Patient presents substantially, was discharged in stable condition to follow-up with primary care.   Laura Muskrat, MD 01/17/16 [phone]

## 2016-01-22 ENCOUNTER — Other Ambulatory Visit: Payer: Self-pay | Admitting: Neurology

## 2016-01-25 ENCOUNTER — Telehealth: Payer: Self-pay | Admitting: Neurology

## 2016-01-25 NOTE — Telephone Encounter (Signed)
Pt called requesting refill for gabapentin (NEURONTIN) 100 MG capsule . Message relayed to pt she needs to be seen to get refill. Pt said she will check with PCP for refill. If PCP won't refill she will call back to schedule appt.

## 2016-01-27 DIAGNOSIS — K529 Noninfective gastroenteritis and colitis, unspecified: Secondary | ICD-10-CM | POA: Diagnosis not present

## 2016-02-09 DIAGNOSIS — Z79899 Other long term (current) drug therapy: Secondary | ICD-10-CM | POA: Diagnosis not present

## 2016-02-09 DIAGNOSIS — R413 Other amnesia: Secondary | ICD-10-CM | POA: Diagnosis not present

## 2016-02-09 DIAGNOSIS — Z8542 Personal history of malignant neoplasm of other parts of uterus: Secondary | ICD-10-CM | POA: Diagnosis not present

## 2016-02-09 DIAGNOSIS — Z08 Encounter for follow-up examination after completed treatment for malignant neoplasm: Secondary | ICD-10-CM | POA: Diagnosis not present

## 2016-02-09 DIAGNOSIS — Z7982 Long term (current) use of aspirin: Secondary | ICD-10-CM | POA: Diagnosis not present

## 2016-02-09 DIAGNOSIS — Z923 Personal history of irradiation: Secondary | ICD-10-CM | POA: Diagnosis not present

## 2016-02-09 DIAGNOSIS — I10 Essential (primary) hypertension: Secondary | ICD-10-CM | POA: Diagnosis not present

## 2016-02-09 DIAGNOSIS — Z9221 Personal history of antineoplastic chemotherapy: Secondary | ICD-10-CM | POA: Diagnosis not present

## 2016-02-09 DIAGNOSIS — K5669 Other intestinal obstruction: Secondary | ICD-10-CM | POA: Diagnosis not present

## 2016-02-09 DIAGNOSIS — E785 Hyperlipidemia, unspecified: Secondary | ICD-10-CM | POA: Diagnosis not present

## 2016-02-09 DIAGNOSIS — Z90722 Acquired absence of ovaries, bilateral: Secondary | ICD-10-CM | POA: Diagnosis not present

## 2016-02-09 DIAGNOSIS — I341 Nonrheumatic mitral (valve) prolapse: Secondary | ICD-10-CM | POA: Diagnosis not present

## 2016-02-09 DIAGNOSIS — Z90710 Acquired absence of both cervix and uterus: Secondary | ICD-10-CM | POA: Diagnosis not present

## 2016-03-14 ENCOUNTER — Other Ambulatory Visit: Payer: Self-pay | Admitting: Neurology

## 2016-04-06 DIAGNOSIS — R197 Diarrhea, unspecified: Secondary | ICD-10-CM | POA: Diagnosis not present

## 2016-05-03 DIAGNOSIS — H04123 Dry eye syndrome of bilateral lacrimal glands: Secondary | ICD-10-CM | POA: Diagnosis not present

## 2016-05-03 DIAGNOSIS — H02403 Unspecified ptosis of bilateral eyelids: Secondary | ICD-10-CM | POA: Diagnosis not present

## 2016-05-03 DIAGNOSIS — D2311 Other benign neoplasm of skin of right eyelid, including canthus: Secondary | ICD-10-CM | POA: Diagnosis not present

## 2016-05-03 DIAGNOSIS — H26493 Other secondary cataract, bilateral: Secondary | ICD-10-CM | POA: Diagnosis not present

## 2016-05-03 DIAGNOSIS — H524 Presbyopia: Secondary | ICD-10-CM | POA: Diagnosis not present

## 2016-05-23 ENCOUNTER — Emergency Department (HOSPITAL_COMMUNITY)
Admission: EM | Admit: 2016-05-23 | Discharge: 2016-05-24 | Disposition: A | Discharge status: Home / Self Care | Payer: Commercial Managed Care - HMO | Attending: Emergency Medicine | Admitting: Emergency Medicine

## 2016-05-23 ENCOUNTER — Encounter (HOSPITAL_COMMUNITY): Payer: Self-pay | Admitting: Nurse Practitioner

## 2016-05-23 DIAGNOSIS — Z5321 Procedure and treatment not carried out due to patient leaving prior to being seen by health care provider: Secondary | ICD-10-CM | POA: Diagnosis not present

## 2016-05-23 DIAGNOSIS — R109 Unspecified abdominal pain: Secondary | ICD-10-CM | POA: Insufficient documentation

## 2016-05-23 NOTE — ED Notes (Signed)
Pt reports lower quadrants abdominal pain, states typical of when she has ileus or bowel obstructions secondary to scaring from radiation during her uterine cancer treatments. Reports an episode of diarrhea this morning.

## 2016-07-31 DIAGNOSIS — E7800 Pure hypercholesterolemia, unspecified: Secondary | ICD-10-CM | POA: Diagnosis not present

## 2016-07-31 DIAGNOSIS — Z0000 Encounter for general adult medical examination without abnormal findings: Secondary | ICD-10-CM | POA: Diagnosis not present

## 2016-07-31 DIAGNOSIS — Z23 Encounter for immunization: Secondary | ICD-10-CM | POA: Diagnosis not present

## 2016-07-31 DIAGNOSIS — G629 Polyneuropathy, unspecified: Secondary | ICD-10-CM | POA: Diagnosis not present

## 2016-07-31 DIAGNOSIS — Z1389 Encounter for screening for other disorder: Secondary | ICD-10-CM | POA: Diagnosis not present

## 2016-07-31 DIAGNOSIS — R413 Other amnesia: Secondary | ICD-10-CM | POA: Diagnosis not present

## 2017-02-14 DIAGNOSIS — Z8542 Personal history of malignant neoplasm of other parts of uterus: Secondary | ICD-10-CM | POA: Diagnosis not present

## 2017-02-14 DIAGNOSIS — K56600 Partial intestinal obstruction, unspecified as to cause: Secondary | ICD-10-CM | POA: Diagnosis not present

## 2017-02-14 DIAGNOSIS — C541 Malignant neoplasm of endometrium: Secondary | ICD-10-CM | POA: Diagnosis not present

## 2017-02-14 DIAGNOSIS — Z08 Encounter for follow-up examination after completed treatment for malignant neoplasm: Secondary | ICD-10-CM | POA: Diagnosis not present

## 2017-02-14 DIAGNOSIS — E785 Hyperlipidemia, unspecified: Secondary | ICD-10-CM | POA: Diagnosis not present

## 2017-02-14 DIAGNOSIS — Z90722 Acquired absence of ovaries, bilateral: Secondary | ICD-10-CM | POA: Diagnosis not present

## 2017-02-14 DIAGNOSIS — Z9079 Acquired absence of other genital organ(s): Secondary | ICD-10-CM | POA: Diagnosis not present

## 2017-02-14 DIAGNOSIS — Z90710 Acquired absence of both cervix and uterus: Secondary | ICD-10-CM | POA: Diagnosis not present

## 2017-02-14 DIAGNOSIS — R413 Other amnesia: Secondary | ICD-10-CM | POA: Diagnosis not present

## 2017-02-14 DIAGNOSIS — K5669 Other partial intestinal obstruction: Secondary | ICD-10-CM | POA: Diagnosis not present

## 2017-02-14 DIAGNOSIS — I10 Essential (primary) hypertension: Secondary | ICD-10-CM | POA: Diagnosis not present

## 2017-05-06 DIAGNOSIS — H02403 Unspecified ptosis of bilateral eyelids: Secondary | ICD-10-CM | POA: Diagnosis not present

## 2017-05-06 DIAGNOSIS — D2311 Other benign neoplasm of skin of right eyelid, including canthus: Secondary | ICD-10-CM | POA: Diagnosis not present

## 2017-05-06 DIAGNOSIS — H5202 Hypermetropia, left eye: Secondary | ICD-10-CM | POA: Diagnosis not present

## 2017-05-06 DIAGNOSIS — H524 Presbyopia: Secondary | ICD-10-CM | POA: Diagnosis not present

## 2017-05-06 DIAGNOSIS — H52221 Regular astigmatism, right eye: Secondary | ICD-10-CM | POA: Diagnosis not present

## 2017-05-06 DIAGNOSIS — H04123 Dry eye syndrome of bilateral lacrimal glands: Secondary | ICD-10-CM | POA: Diagnosis not present

## 2017-05-06 DIAGNOSIS — H26493 Other secondary cataract, bilateral: Secondary | ICD-10-CM | POA: Diagnosis not present

## 2017-08-15 DIAGNOSIS — I10 Essential (primary) hypertension: Secondary | ICD-10-CM | POA: Diagnosis not present

## 2017-08-15 DIAGNOSIS — K565 Intestinal adhesions [bands], unspecified as to partial versus complete obstruction: Secondary | ICD-10-CM | POA: Diagnosis not present

## 2017-08-15 DIAGNOSIS — Z1389 Encounter for screening for other disorder: Secondary | ICD-10-CM | POA: Diagnosis not present

## 2017-08-15 DIAGNOSIS — E7800 Pure hypercholesterolemia, unspecified: Secondary | ICD-10-CM | POA: Diagnosis not present

## 2017-08-15 DIAGNOSIS — Z0000 Encounter for general adult medical examination without abnormal findings: Secondary | ICD-10-CM | POA: Diagnosis not present

## 2017-08-15 DIAGNOSIS — R413 Other amnesia: Secondary | ICD-10-CM | POA: Diagnosis not present

## 2018-02-27 DIAGNOSIS — Z9229 Personal history of other drug therapy: Secondary | ICD-10-CM | POA: Diagnosis not present

## 2018-02-27 DIAGNOSIS — Z86000 Personal history of in-situ neoplasm of breast: Secondary | ICD-10-CM | POA: Diagnosis not present

## 2018-02-27 DIAGNOSIS — K56600 Partial intestinal obstruction, unspecified as to cause: Secondary | ICD-10-CM | POA: Diagnosis not present

## 2018-02-27 DIAGNOSIS — R413 Other amnesia: Secondary | ICD-10-CM | POA: Diagnosis not present

## 2018-02-27 DIAGNOSIS — Z90722 Acquired absence of ovaries, bilateral: Secondary | ICD-10-CM | POA: Diagnosis not present

## 2018-02-27 DIAGNOSIS — Z9079 Acquired absence of other genital organ(s): Secondary | ICD-10-CM | POA: Diagnosis not present

## 2018-02-27 DIAGNOSIS — E785 Hyperlipidemia, unspecified: Secondary | ICD-10-CM | POA: Diagnosis not present

## 2018-02-27 DIAGNOSIS — Z8542 Personal history of malignant neoplasm of other parts of uterus: Secondary | ICD-10-CM | POA: Diagnosis not present

## 2018-02-27 DIAGNOSIS — Z90710 Acquired absence of both cervix and uterus: Secondary | ICD-10-CM | POA: Diagnosis not present

## 2018-02-27 DIAGNOSIS — I10 Essential (primary) hypertension: Secondary | ICD-10-CM | POA: Diagnosis not present

## 2018-02-27 DIAGNOSIS — Z08 Encounter for follow-up examination after completed treatment for malignant neoplasm: Secondary | ICD-10-CM | POA: Diagnosis not present

## 2018-05-13 DIAGNOSIS — Z961 Presence of intraocular lens: Secondary | ICD-10-CM | POA: Diagnosis not present

## 2018-05-13 DIAGNOSIS — H04123 Dry eye syndrome of bilateral lacrimal glands: Secondary | ICD-10-CM | POA: Diagnosis not present

## 2018-05-13 DIAGNOSIS — H5203 Hypermetropia, bilateral: Secondary | ICD-10-CM | POA: Diagnosis not present

## 2018-05-13 DIAGNOSIS — H02403 Unspecified ptosis of bilateral eyelids: Secondary | ICD-10-CM | POA: Diagnosis not present

## 2018-05-13 DIAGNOSIS — H43813 Vitreous degeneration, bilateral: Secondary | ICD-10-CM | POA: Diagnosis not present

## 2018-05-13 DIAGNOSIS — H524 Presbyopia: Secondary | ICD-10-CM | POA: Diagnosis not present

## 2018-06-17 ENCOUNTER — Other Ambulatory Visit: Payer: Self-pay | Admitting: Internal Medicine

## 2018-06-17 ENCOUNTER — Ambulatory Visit
Admission: RE | Admit: 2018-06-17 | Discharge: 2018-06-17 | Disposition: A | Discharge status: Home / Self Care | Payer: Medicare HMO | Source: Ambulatory Visit | Attending: Internal Medicine | Admitting: Internal Medicine

## 2018-06-17 DIAGNOSIS — G629 Polyneuropathy, unspecified: Secondary | ICD-10-CM | POA: Diagnosis not present

## 2018-06-17 DIAGNOSIS — Z8719 Personal history of other diseases of the digestive system: Secondary | ICD-10-CM

## 2018-06-17 DIAGNOSIS — R112 Nausea with vomiting, unspecified: Secondary | ICD-10-CM | POA: Diagnosis not present

## 2018-06-17 DIAGNOSIS — R109 Unspecified abdominal pain: Secondary | ICD-10-CM | POA: Diagnosis not present

## 2018-06-17 DIAGNOSIS — R413 Other amnesia: Secondary | ICD-10-CM | POA: Diagnosis not present

## 2018-06-24 DIAGNOSIS — R413 Other amnesia: Secondary | ICD-10-CM | POA: Diagnosis not present

## 2018-07-01 DIAGNOSIS — R413 Other amnesia: Secondary | ICD-10-CM | POA: Diagnosis not present

## 2018-07-08 DIAGNOSIS — E538 Deficiency of other specified B group vitamins: Secondary | ICD-10-CM | POA: Diagnosis not present

## 2018-07-15 DIAGNOSIS — E538 Deficiency of other specified B group vitamins: Secondary | ICD-10-CM | POA: Diagnosis not present

## 2018-08-06 DIAGNOSIS — Z23 Encounter for immunization: Secondary | ICD-10-CM | POA: Diagnosis not present

## 2018-08-14 ENCOUNTER — Encounter: Payer: Self-pay | Admitting: Neurology

## 2018-08-14 ENCOUNTER — Encounter

## 2018-08-14 ENCOUNTER — Ambulatory Visit: Payer: Medicare HMO | Admitting: Neurology

## 2018-08-14 VITALS — BP 138/68 | HR 68 | Ht 65.0 in | Wt 119.5 lb

## 2018-08-14 DIAGNOSIS — G6289 Other specified polyneuropathies: Secondary | ICD-10-CM | POA: Diagnosis not present

## 2018-08-14 DIAGNOSIS — G629 Polyneuropathy, unspecified: Secondary | ICD-10-CM | POA: Insufficient documentation

## 2018-08-14 DIAGNOSIS — R2689 Other abnormalities of gait and mobility: Secondary | ICD-10-CM | POA: Diagnosis not present

## 2018-08-14 DIAGNOSIS — R52 Pain, unspecified: Secondary | ICD-10-CM | POA: Diagnosis not present

## 2018-08-14 DIAGNOSIS — G309 Alzheimer's disease, unspecified: Secondary | ICD-10-CM | POA: Diagnosis not present

## 2018-08-14 DIAGNOSIS — R799 Abnormal finding of blood chemistry, unspecified: Secondary | ICD-10-CM | POA: Diagnosis not present

## 2018-08-14 DIAGNOSIS — E538 Deficiency of other specified B group vitamins: Secondary | ICD-10-CM | POA: Diagnosis not present

## 2018-08-14 DIAGNOSIS — F0280 Dementia in other diseases classified elsewhere without behavioral disturbance: Secondary | ICD-10-CM | POA: Insufficient documentation

## 2018-08-14 DIAGNOSIS — R7309 Other abnormal glucose: Secondary | ICD-10-CM | POA: Diagnosis not present

## 2018-08-14 DIAGNOSIS — E559 Vitamin D deficiency, unspecified: Secondary | ICD-10-CM | POA: Diagnosis not present

## 2018-08-14 NOTE — Progress Notes (Signed)
PATIENT: Laura Perkins DOB: Feb 24, 1935  Chief Complaint  Patient presents with  . Memory Loss    MMSE 22/30 - 9 animals.  Last seen in 12/2014 (MMSE was 29/30 at that time).  She is here with her husband, Laura Perkins.  Her memory has continued to significantly decline, especially her short-term recall.  . Peripheral Neuropathy    Reports numbness and pain in the bottom of both feet.  Marland Kitchen PCP    Laura Carol, MD     [name] is a 83 year old female, seen in request by her primary care physician Dr. Delfina Perkins, Laura Perkins for memory loss, she is accompanied by her husband at today's visit, I saw her initially on January 10, 2015 for similar complaints.  I have reviewed and summarized the referring note from the referring physician, she had a past history of breast cancer, involving left, and later right side, clinical stage II uterine carcinosarcoma diagnosed at the time of vaginal hysterectomy and BSO for pelvic prolapse 2010 and underwent adjuvant treatments of therapy withTaxol and Laura Perkins chemotherapy beginning October 2010, external beam radiation, and then repeat Laura Perkins and Laura Perkins chemotherapy, finishing 02/27/2010  She has memory problems since October 2010, unable to complete a sentence. She has difficulty with short-term memory. She "cannot remember what she did yesterday" She feels "foggy". She enjoys reading and but has trouble remembering what she was reading  Her brother died of Lewy body dementia at age 69,    MRI of the brain without contrast 03/31/2010 showed generalized atrophy and small vessel disease.   Labs B12 level, RPR,TSH, and Methylmalonic acid were normal. doppler of the carotids 03/28/2010 was normal.   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 She is taking gabapentin for bilateral feet paresthesia, which has been helpful  She has had side effects to Laura Perkins in the past with nightmares. She had dizziness on Laura Perkins.    high copy with Laura Perkins patch,  she is not taking any medication for her memory trouble.  She continue complains mild worsening memory trouble, no longer driving, in February 2016, her Mini-Mental status was 29/30, with animal naming 15  She was recently diagnosed with vitamin B12 deficiency, received IM supplement since August 2019,   Over the past 3 years, she had slow worsening memory loss, no longer driving, worsening bilateral feet paresthesia, now up to ankle level now, she denies significant gait abnormality.    REVIEW OF SYSTEMS: Full 14 system review of systems performed and notable only for memory loss, confusion, numbness, anxiety All other review of systems were negative.  ALLERGIES: Allergies  Allergen Reactions  . Sulfa Antibiotics Anaphylaxis    HOME MEDICATIONS: Current Outpatient Medications  Medication Sig Dispense Refill  . atorvastatin (LIPITOR) 20 MG tablet Take 10-20 mg by mouth daily. Alternate 10 mg (1/2 tablet) with 20 mg (1 tablet ) every other day    . carboxymethylcellulose (REFRESH PLUS) 0.5 % SOLN Place 1 drop into both eyes 3 (three) times daily as needed (for irritated eyes).    . gabapentin (NEURONTIN) 100 MG capsule TAKE 2 CAPSULES (200 MG TOTAL) BY MOUTH 4 (FOUR) TIMES DAILY. 240 capsule 0  . HYDROcodone-acetaminophen (NORCO/VICODIN) 5-325 MG tablet Take 0.5 tablets by mouth every 4 (four) hours as needed for severe pain. 15 tablet 0  . Multiple Vitamin (MULTIVITAMIN WITH MINERALS) TABS Take 1 tablet by mouth daily.    . ondansetron (ZOFRAN ODT) 4 MG disintegrating tablet Take 1 tablet (  4 mg total) by mouth every 8 (eight) hours as needed for nausea or vomiting. 20 tablet 0  . polyethylene glycol (MIRALAX / GLYCOLAX) packet Take 17 g by mouth daily as needed for mild constipation or moderate constipation.    . Probiotic Product (PROBIOTIC DAILY PO) Take 1 capsule by mouth daily.    . simethicone (GAS-X) 80 MG chewable tablet Chew 1 tablet (80 mg total)  by mouth every 6 (six) hours as needed for flatulence. 30 tablet 0   No current facility-administered medications for this visit.     PAST MEDICAL HISTORY: Past Medical History:  Diagnosis Date  . Abdominal adhesions   . Alzheimer's disease with memory loss 06/25/2013  . Breast cancer ([employer])   . Carcinosarcoma of uterus s/p TAH BSO 2010 11/22/2012   REPORT OF SURGICAL PATHOLOGY  Case #: [id number] Patient Name: Laura Perkins Office Chart Number: N/A  MRN: [id number] Pathologist: Laura Kotyk B. Lyndon Code, MD DOB/Age May 07, 1935 (Age: 28) Gender: F Date Taken: 07/28/2009 Date Received: 07/28/2009  FINAL DIAGNOSIS  MICROSCOPIC EXAMINATION AND DIAGNOSIS  UTERUS, OVARIES AND FALLOPIAN TUBES, HYSTERECTOMY AND BILATERAL SALPINGO-OOPHORECTOMY: - CARCINOSARCOMA, ARISING FROM THE LOWER UTERINE SEGMENT, SPANNING 4.5 CM. - CARCINOSARCOMA IS 0.1 CM TO THE DEEP SOFT TISSUE RESECTION MARGIN. - CERVIX: ATROPHY AND CHRONIC INFLAMMATION. - ENDOMETRIUM: ATROPHIC APPEARING ENDOMETRIUM. - MYOMETRIUM: LEIOMYOMATA. - SEROSA: ESSENTIALLY UNREMARKABLE. - ADNEXA: PARATUBAL CYST  COMMENT Immunohistochemical stains were performed on the malignant neoplasm with the following results. The controls stained appropriately.  P16 Positive Estrogen receptor Scattered positive cells Progesterone receptor Scattered positive cells Cytokeratin AE1/AE3 Positive in epithe  . 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 High cholesterol   . Hypertension   . Ileus ([place])   . Overactive bladder   . 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 Uterine cancer ([place])     PAST SURGICAL HISTORY: Past Surgical History:  Procedure Laterality Date  . ABDOMINAL HYSTERECTOMY  2010   Dr Laura Perkins  . ANKLE SURGERY Left   . HERNIA REPAIR     as a child  . MASTECTOMY PARTIAL / LUMPECTOMY Left 1990   Breast sarcoma    FAMILY HISTORY: Family History  Problem Relation Age of Onset  . Cancer Father         pancreatic   . Other Mother        "natural causes"    SOCIAL HISTORY: Social History   Socioeconomic History  . Marital status: Married    Spouse name: Laura Perkins   . Number of children: 1  . Years of education: HS   . Highest education level: Not on file  Occupational History  . Occupation: retired   Scientific laboratory technician  . Financial resource strain: Not on file  . Food insecurity:    Worry: Not on file    Inability: Not on file  . Transportation needs:    Medical: Not on file    Non-medical: Not on file  Tobacco Use  . Smoking status: Never Smoker  . Smokeless tobacco: Never Used  Substance and Sexual Activity  . Alcohol use: No  . Drug use: No  . Sexual activity: Never    Comment: married  Lifestyle  . Physical activity:    Days per week: Not on file    Minutes per session: Not on file  . Stress: Not on file  Relationships  . Social connections:    Talks on phone: Not on file  Gets together: Not on file    Attends religious service: Not on file    Active member of club or organization: Not on file    Attends meetings of clubs or organizations: Not on file    Relationship status: Not on file  . Intimate partner violence:    Fear of current or ex partner: Not on file    Emotionally abused: Not on file    Physically abused: Not on file    Forced sexual activity: Not on file  Other Topics Concern  . Not on file  Social History Narrative   Patient lives at home with home Laura Perkins her husband.    Patient has 1 child.    Patient is retired.    Patient has a high school education.    Patient is right handed.   2-3 cups coffee per day.     PHYSICAL EXAM   Vitals:   08/14/18 1118  BP: 138/68  Pulse: 68  Weight: 119 lb 8 oz (54.2 kg)  Height: 5\' 5"  (1.651 m)    Not recorded      Body mass index is 19.89 kg/m.  PHYSICAL EXAMNIATION:  Gen: NAD, conversant, well nourised, obese, well groomed                     Cardiovascular: Regular rate rhythm, no peripheral  edema, warm, nontender. Eyes: Conjunctivae clear without exudates or hemorrhage Neck: Supple, no carotid bruits. Pulmonary: Clear to auscultation bilaterally   NEUROLOGICAL EXAM: MMSE - Mini Mental State Exam 08/14/2018 01/10/2015 10/11/2014  Orientation to time 2 5 5   Orientation to Place 3 5 5   Registration 3 3 3   Attention/ Calculation 5 5 5   Recall 0 2 3  Language- name 2 objects 2 2 2   Language- repeat 1 1 1   Language- follow 3 step command 3 3 2   Language- read & follow direction 1 1 1   Write a sentence 1 1 1   Copy design 1 1 1   Total score 22 29 29   animal naming 9.   CRANIAL NERVES: CN II: Visual fields are full to confrontation. Fundoscopic exam is normal with sharp discs and no vascular changes. Pupils are round equal and briskly reactive to light. CN III, IV, VI: extraocular movement are normal. No ptosis. CN V: Facial sensation is intact to pinprick in all 3 divisions bilaterally. Corneal responses are intact.  CN VII: Face is symmetric with normal eye closure and smile. CN VIII: Hearing is normal to rubbing fingers CN IX, X: Palate elevates symmetrically. Phonation is normal. CN XI: Head turning and shoulder shrug are intact CN XII: Tongue is midline with normal movements and no atrophy.  MOTOR: There is no pronator drift of out-stretched arms. Muscle bulk and tone are normal. Muscle strength is normal.  REFLEXES: Reflexes are 2+ and symmetric at the biceps, triceps, knees, and trace ankles. Plantar responses are flexor.  SENSORY: Length dependent decreased to light touch, pinprick and vibratory sensation to ankle level.  COORDINATION: Rapid alternating movements and fine finger movements are intact. There is no dysmetria on finger-to-nose and heel-knee-shin.    GAIT/STANCE: Posture is normal. Gait is steady with normal steps    DIAGNOSTIC DATA (LABS, IMAGING, TESTING) - I reviewed patient records, labs, notes, testing and imaging myself where  available.   ASSESSMENT AND PLAN  Laura Perkins is a 83 y.o. female   Dementia without behavioral issue  Could not tolerate Laura Perkins, Laura Perkins in the past,  Family history of central nervous system degenerative disorder, brother died of Lewy body dementia at age 48  Peripheral neuropathy  Started since chemotherapy in 2011  Progressively worsened.  Laboratory evaluation for treatable etiology  EMG/NCS.   Marcial Pacas, M.D. Ph.D.  [facility] [address] [place], [place] [address] Ph: [phone] Fax: [phone]  CC: Laura Carol, MD

## 2018-08-15 DIAGNOSIS — E538 Deficiency of other specified B group vitamins: Secondary | ICD-10-CM | POA: Diagnosis not present

## 2018-08-16 LAB — CBC WITH DIFFERENTIAL/PLATELET
BASOS: 1 %
Basophils Absolute: 0.1 10*3/uL (ref 0.0–0.2)
EOS (ABSOLUTE): 0.2 10*3/uL (ref 0.0–0.4)
EOS: 3 %
HEMATOCRIT: 40.7 % (ref 34.0–46.6)
Hemoglobin: 13.2 g/dL (ref 11.1–15.9)
IMMATURE GRANS (ABS): 0 10*3/uL (ref 0.0–0.1)
Immature Granulocytes: 0 %
LYMPHS: 19 %
Lymphocytes Absolute: 1.3 10*3/uL (ref 0.7–3.1)
MCH: 32.1 pg (ref 26.6–33.0)
MCHC: 32.4 g/dL (ref 31.5–35.7)
MCV: 99 fL — AB (ref 79–97)
MONOCYTES: 8 %
Monocytes Absolute: 0.6 10*3/uL (ref 0.1–0.9)
NEUTROS ABS: 4.9 10*3/uL (ref 1.4–7.0)
Neutrophils: 69 %
Platelets: 305 10*3/uL (ref 150–450)
RBC: 4.11 x10E6/uL (ref 3.77–5.28)
RDW: 14.2 % (ref 12.3–15.4)
WBC: 7 10*3/uL (ref 3.4–10.8)

## 2018-08-16 LAB — VITAMIN D 25 HYDROXY (VIT D DEFICIENCY, FRACTURES): Vit D, 25-Hydroxy: 29.2 ng/mL — ABNORMAL LOW (ref 30.0–100.0)

## 2018-08-16 LAB — PROTEIN ELECTROPHORESIS
A/G Ratio: 1.3 (ref 0.7–1.7)
ALPHA 2: 0.8 g/dL (ref 0.4–1.0)
Albumin ELP: 3.7 g/dL (ref 2.9–4.4)
Alpha 1: 0.2 g/dL (ref 0.0–0.4)
Beta: 0.9 g/dL (ref 0.7–1.3)
GAMMA GLOBULIN: 0.9 g/dL (ref 0.4–1.8)
Globulin, Total: 2.8 g/dL (ref 2.2–3.9)

## 2018-08-16 LAB — COMPREHENSIVE METABOLIC PANEL
A/G RATIO: 1.8 (ref 1.2–2.2)
ALT: 15 IU/L (ref 0–32)
AST: 23 IU/L (ref 0–40)
Albumin: 4.2 g/dL (ref 3.5–4.7)
Alkaline Phosphatase: 77 IU/L (ref 39–117)
BILIRUBIN TOTAL: 0.7 mg/dL (ref 0.0–1.2)
BUN/Creatinine Ratio: 15 (ref 12–28)
BUN: 12 mg/dL (ref 8–27)
CHLORIDE: 104 mmol/L (ref 96–106)
CO2: 24 mmol/L (ref 20–29)
Calcium: 9.9 mg/dL (ref 8.7–10.3)
Creatinine, Ser: 0.8 mg/dL (ref 0.57–1.00)
GFR, EST AFRICAN AMERICAN: 80 mL/min/{1.73_m2} (ref >59)
GFR, EST NON AFRICAN AMERICAN: 69 mL/min/{1.73_m2} (ref >59)
Globulin, Total: 2.3 g/dL (ref 1.5–4.5)
Glucose: 93 mg/dL (ref 65–99)
Potassium: 4.3 mmol/L (ref 3.5–5.2)
Sodium: 143 mmol/L (ref 134–144)
TOTAL PROTEIN: 6.5 g/dL (ref 6.0–8.5)

## 2018-08-16 LAB — ANA W/REFLEX IF POSITIVE: Anti Nuclear Antibody(ANA): NEGATIVE

## 2018-08-16 LAB — VITAMIN B12: VITAMIN B 12: 390 pg/mL (ref 232–1245)

## 2018-08-16 LAB — HGB A1C W/O EAG: HEMOGLOBIN A1C: 5.4 % (ref 4.8–5.6)

## 2018-08-16 LAB — TSH: TSH: 3.9 u[IU]/mL (ref 0.450–4.500)

## 2018-08-16 LAB — COPPER, SERUM: COPPER: 102 ug/dL (ref 72–166)

## 2018-08-16 LAB — SEDIMENTATION RATE: Sed Rate: 3 mm/hr (ref 0–40)

## 2018-08-18 ENCOUNTER — Encounter: Payer: Self-pay | Admitting: *Deleted

## 2018-08-18 ENCOUNTER — Telehealth: Payer: Self-pay | Admitting: Neurology

## 2018-08-18 NOTE — Telephone Encounter (Signed)
Spoke to patient - she is aware of her lab results and agreeable to start the recommended supplement.

## 2018-08-18 NOTE — Telephone Encounter (Signed)
Please call patient, extensive laboratory evaluation shows slightly decreased vitamin D 29, she would benefit over-the-counter vitamin D3 supplement 1000 units daily, rest of the laboratory evaluation showed no significant abnormality.

## 2018-09-02 DIAGNOSIS — Z853 Personal history of malignant neoplasm of breast: Secondary | ICD-10-CM | POA: Diagnosis not present

## 2018-09-02 DIAGNOSIS — R413 Other amnesia: Secondary | ICD-10-CM | POA: Diagnosis not present

## 2018-09-02 DIAGNOSIS — E7800 Pure hypercholesterolemia, unspecified: Secondary | ICD-10-CM | POA: Diagnosis not present

## 2018-09-02 DIAGNOSIS — Z0000 Encounter for general adult medical examination without abnormal findings: Secondary | ICD-10-CM | POA: Diagnosis not present

## 2018-09-02 DIAGNOSIS — Z8542 Personal history of malignant neoplasm of other parts of uterus: Secondary | ICD-10-CM | POA: Diagnosis not present

## 2018-09-02 DIAGNOSIS — Z1389 Encounter for screening for other disorder: Secondary | ICD-10-CM | POA: Diagnosis not present

## 2018-09-02 DIAGNOSIS — E538 Deficiency of other specified B group vitamins: Secondary | ICD-10-CM | POA: Diagnosis not present

## 2018-09-02 DIAGNOSIS — G629 Polyneuropathy, unspecified: Secondary | ICD-10-CM | POA: Diagnosis not present

## 2018-09-02 DIAGNOSIS — Z23 Encounter for immunization: Secondary | ICD-10-CM | POA: Diagnosis not present

## 2018-09-12 ENCOUNTER — Ambulatory Visit (INDEPENDENT_AMBULATORY_CARE_PROVIDER_SITE_OTHER): Payer: Medicare HMO | Admitting: Neurology

## 2018-09-12 ENCOUNTER — Encounter: Payer: Medicare HMO | Admitting: Neurology

## 2018-09-12 DIAGNOSIS — Z0289 Encounter for other administrative examinations: Secondary | ICD-10-CM

## 2018-09-12 DIAGNOSIS — R202 Paresthesia of skin: Secondary | ICD-10-CM | POA: Diagnosis not present

## 2018-09-12 DIAGNOSIS — G6289 Other specified polyneuropathies: Secondary | ICD-10-CM

## 2018-09-12 DIAGNOSIS — G309 Alzheimer's disease, unspecified: Secondary | ICD-10-CM

## 2018-09-12 DIAGNOSIS — F0280 Dementia in other diseases classified elsewhere without behavioral disturbance: Secondary | ICD-10-CM

## 2018-09-12 MED ORDER — DICLOFENAC SODIUM 1 % TD GEL: 2.0000 g | Freq: Four times a day (QID) | TRANSDERMAL | 11 refills | Status: DC

## 2018-09-12 MED ORDER — LIDOCAINE-PRILOCAINE 2.5-2.5 % EX CREA: 1.0000 "application " | TOPICAL_CREAM | CUTANEOUS | 11 refills | Status: DC | PRN

## 2018-09-12 NOTE — Procedures (Signed)
Full Name: Laura Perkins Gender: Female MRN #: [id number] Date of Birth: [birth year]    Visit Date: 09/12/18 10:04 Age: 83 Years 40 Months Old Examining Physician: Marcial Pacas, MD  Referring Physician: Krista Blue, MD History: 83 years old female complains of bilateral feet paresthesia.  Summary of the tests: Nerve conduction study: Bilateral sural, superficial peroneal sensory responses were absent.  Bilateral peroneal to EDB and tibial motor responses showed moderate to severely decreased the C map amplitude.  Right ulnar sensory and motor responses were normal.  Electromyography:  Selected needle examination showed chronic neuropathic changes involving right abductor hallucis.  Conclusion: This is an abnormal study.  There is electrodiagnostic evidence of mild axonal sensorimotor polyneuropathy.    ------------------------------- Marcial Pacas, M.D. PhD.  [facility] [place],  [address] Tel: [phone] Fax: [phone]        [facility]    Nerve / Sites Muscle Latency Ref. Amplitude Ref. Rel Amp Segments Distance Velocity Ref. Area    ms ms mV mV %  cm m/s m/s mVms  R Ulnar - ADM     Wrist ADM 3.1 ?3.3 7.7 ?6.0 100 Wrist - ADM 7   29.1     B.Elbow ADM 5.9  7.3  95.2 B.Elbow - Wrist 16 57 ?49 27.1     A.Elbow ADM 7.7  7.3  100 A.Elbow - B.Elbow 10 58 ?49 28.4         A.Elbow - Wrist      R Peroneal - EDB     Ankle EDB 6.0 ?6.5 0.5 ?2.0 100 Ankle - EDB 9   1.6     Fib head EDB 12.4  0.5  106 Fib head - Ankle 28 44 ?44 1.8     Pop fossa EDB 15.1  0.5  93.4 Pop fossa - Fib head 12 44 ?44 1.7         Pop fossa - Ankle      L Peroneal - EDB     Ankle EDB 5.7 ?6.5 0.9 ?2.0 100 Ankle - EDB 9   3.0     Fib head EDB 11.9  0.8  94.2 Fib head - Ankle 28 45 ?44 2.7     Pop fossa EDB 14.1  1.0  119 Pop fossa - Fib head 10 45 ?44 3.3         Pop fossa - Ankle      R Tibial - AH     Ankle AH 4.7 ?5.8 2.6 ?4.0 100 Ankle - AH 9   7.6     Pop fossa AH  13.1  1.5  59.3 Pop fossa - Ankle 36 43 ?41 5.0  L Tibial - AH     Ankle AH 5.3 ?5.8 2.0 ?4.0 100 Ankle - AH 9   4.8     Pop fossa AH 13.5  1.3  67.3 Pop fossa - Ankle 36 43 ?41 7.0               SNC    Nerve / Sites Rec. Site Peak Lat Ref.  Amp Ref. Segments Distance    ms ms V V  cm  R Sural - Ankle (Calf)     Calf Ankle NR ?4.4 NR ?6 Calf - Ankle 14  L Sural - Ankle (Calf)     Calf Ankle NR ?4.4 NR ?6 Calf - Ankle 14  R Superficial peroneal - Ankle     Lat leg  Ankle NR ?4.4 NR ?6 Lat leg - Ankle 14  L Superficial peroneal - Ankle     Lat leg Ankle NR ?4.4 NR ?6 Lat leg - Ankle 14  R Ulnar - Orthodromic, (Dig V, Mid palm)     Dig V Wrist 2.9 ?3.1 7 ?5 Dig V - Wrist 79               F  Wave    Nerve F Lat Ref.   ms ms  R Tibial - AH 48.5 ?56.0  L Tibial - AH 48.8 ?56.0  R Ulnar - ADM 28.6 ?32.0           EMG full       EMG Summary Table    Spontaneous MUAP Recruitment  Muscle IA Fib PSW Fasc Other Amp Dur. Poly Pattern  R. Tibialis anterior Normal None None None _______ Normal Normal Normal Normal  R. Peroneus longus Normal None None None _______ Normal Normal Normal Normal  R. Gastrocnemius (Medial head) Normal None None None _______ Normal Normal Normal Normal  R. Vastus lateralis Normal None None None _______ Normal Normal Normal Normal  R. Abductor hallucis Normal None None None _______ Increased Normal Normal Reduced

## 2018-09-16 DIAGNOSIS — E538 Deficiency of other specified B group vitamins: Secondary | ICD-10-CM | POA: Diagnosis not present

## 2018-10-17 DIAGNOSIS — E538 Deficiency of other specified B group vitamins: Secondary | ICD-10-CM | POA: Diagnosis not present

## 2018-11-18 DIAGNOSIS — E538 Deficiency of other specified B group vitamins: Secondary | ICD-10-CM | POA: Diagnosis not present

## 2018-12-22 DIAGNOSIS — E538 Deficiency of other specified B group vitamins: Secondary | ICD-10-CM | POA: Diagnosis not present

## 2019-01-21 DIAGNOSIS — E538 Deficiency of other specified B group vitamins: Secondary | ICD-10-CM | POA: Diagnosis not present

## 2019-03-12 DIAGNOSIS — C541 Malignant neoplasm of endometrium: Secondary | ICD-10-CM | POA: Diagnosis not present

## 2019-05-19 DIAGNOSIS — H43813 Vitreous degeneration, bilateral: Secondary | ICD-10-CM | POA: Diagnosis not present

## 2019-05-19 DIAGNOSIS — H04123 Dry eye syndrome of bilateral lacrimal glands: Secondary | ICD-10-CM | POA: Diagnosis not present

## 2019-05-19 DIAGNOSIS — H02403 Unspecified ptosis of bilateral eyelids: Secondary | ICD-10-CM | POA: Diagnosis not present

## 2019-05-19 DIAGNOSIS — H26493 Other secondary cataract, bilateral: Secondary | ICD-10-CM | POA: Diagnosis not present

## 2019-09-30 DIAGNOSIS — Z0000 Encounter for general adult medical examination without abnormal findings: Secondary | ICD-10-CM | POA: Diagnosis not present

## 2019-09-30 DIAGNOSIS — Z8542 Personal history of malignant neoplasm of other parts of uterus: Secondary | ICD-10-CM | POA: Diagnosis not present

## 2019-09-30 DIAGNOSIS — F0280 Dementia in other diseases classified elsewhere without behavioral disturbance: Secondary | ICD-10-CM | POA: Diagnosis not present

## 2019-09-30 DIAGNOSIS — Z1389 Encounter for screening for other disorder: Secondary | ICD-10-CM | POA: Diagnosis not present

## 2019-09-30 DIAGNOSIS — E7800 Pure hypercholesterolemia, unspecified: Secondary | ICD-10-CM | POA: Diagnosis not present

## 2019-09-30 DIAGNOSIS — Z853 Personal history of malignant neoplasm of breast: Secondary | ICD-10-CM | POA: Diagnosis not present

## 2019-09-30 DIAGNOSIS — G309 Alzheimer's disease, unspecified: Secondary | ICD-10-CM | POA: Diagnosis not present

## 2019-12-25 ENCOUNTER — Ambulatory Visit: Payer: Medicare HMO | Attending: Internal Medicine

## 2019-12-25 DIAGNOSIS — Z23 Encounter for immunization: Secondary | ICD-10-CM

## 2019-12-25 NOTE — Progress Notes (Signed)
   Covid-19 Vaccination Clinic  Name:  Laura Perkins    MRN: [id number] DOB: Jun 08, 1935  12/25/2019  [name] was observed post Covid-19 immunization for 15 minutes without incidence. She was provided with Vaccine Information Sheet and instruction to access the V-Safe system.   [name] was instructed to call 911 with any severe reactions post vaccine: Marland Kitchen Difficulty breathing  . Swelling of your face and throat  . A fast heartbeat  . A bad rash all over your body  . Dizziness and weakness    Immunizations Administered    Name Date Dose VIS Date Route   Pfizer COVID-19 Vaccine 12/25/2019  2:14 PM 0.3 mL 10/23/2019 Intramuscular   Manufacturer: [place]   Lot: [id number]   [place]: [id number]

## 2020-01-17 ENCOUNTER — Ambulatory Visit: Payer: Medicare HMO | Attending: Internal Medicine

## 2020-01-17 DIAGNOSIS — Z23 Encounter for immunization: Secondary | ICD-10-CM | POA: Insufficient documentation

## 2020-01-17 NOTE — Progress Notes (Signed)
   Covid-19 Vaccination Clinic  Name:  Laura Perkins    MRN: [id number] DOB: Apr 10, 1935  01/17/2020  Laura Perkins was observed post Covid-19 immunization for 15 minutes without incident. She was provided with Vaccine Information Sheet and instruction to access the V-Safe system.   Laura Perkins was instructed to call 911 with any severe reactions post vaccine: Marland Kitchen Difficulty breathing  . Swelling of face and throat  . A fast heartbeat  . A bad rash all over body  . Dizziness and weakness   Immunizations Administered    Name Date Dose VIS Date Route   Pfizer COVID-19 Vaccine 01/17/2020  9:07 AM 0.3 mL 10/23/2019 Intramuscular   Manufacturer: [place]   Lot: [id number]   [place]: [id number]

## 2020-03-17 DIAGNOSIS — C541 Malignant neoplasm of endometrium: Secondary | ICD-10-CM | POA: Diagnosis not present

## 2020-03-17 DIAGNOSIS — Z7982 Long term (current) use of aspirin: Secondary | ICD-10-CM | POA: Diagnosis not present

## 2020-03-17 DIAGNOSIS — Z79899 Other long term (current) drug therapy: Secondary | ICD-10-CM | POA: Diagnosis not present

## 2020-03-17 DIAGNOSIS — Z08 Encounter for follow-up examination after completed treatment for malignant neoplasm: Secondary | ICD-10-CM | POA: Diagnosis not present

## 2020-03-17 DIAGNOSIS — Z8542 Personal history of malignant neoplasm of other parts of uterus: Secondary | ICD-10-CM | POA: Diagnosis not present

## 2020-03-17 DIAGNOSIS — Z87898 Personal history of other specified conditions: Secondary | ICD-10-CM | POA: Diagnosis not present

## 2020-06-28 DIAGNOSIS — H43813 Vitreous degeneration, bilateral: Secondary | ICD-10-CM | POA: Diagnosis not present

## 2020-06-28 DIAGNOSIS — H02403 Unspecified ptosis of bilateral eyelids: Secondary | ICD-10-CM | POA: Diagnosis not present

## 2020-06-28 DIAGNOSIS — H26493 Other secondary cataract, bilateral: Secondary | ICD-10-CM | POA: Diagnosis not present

## 2020-06-28 DIAGNOSIS — H04123 Dry eye syndrome of bilateral lacrimal glands: Secondary | ICD-10-CM | POA: Diagnosis not present

## 2020-10-13 DIAGNOSIS — G309 Alzheimer's disease, unspecified: Secondary | ICD-10-CM | POA: Diagnosis not present

## 2020-10-13 DIAGNOSIS — G629 Polyneuropathy, unspecified: Secondary | ICD-10-CM | POA: Diagnosis not present

## 2020-10-13 DIAGNOSIS — Z0000 Encounter for general adult medical examination without abnormal findings: Secondary | ICD-10-CM | POA: Diagnosis not present

## 2020-10-13 DIAGNOSIS — Z853 Personal history of malignant neoplasm of breast: Secondary | ICD-10-CM | POA: Diagnosis not present

## 2020-10-13 DIAGNOSIS — E7800 Pure hypercholesterolemia, unspecified: Secondary | ICD-10-CM | POA: Diagnosis not present

## 2020-10-13 DIAGNOSIS — Z1389 Encounter for screening for other disorder: Secondary | ICD-10-CM | POA: Diagnosis not present

## 2020-10-13 DIAGNOSIS — Z8542 Personal history of malignant neoplasm of other parts of uterus: Secondary | ICD-10-CM | POA: Diagnosis not present

## 2021-01-12 DIAGNOSIS — R42 Dizziness and giddiness: Secondary | ICD-10-CM | POA: Diagnosis not present

## 2021-05-10 ENCOUNTER — Encounter: Payer: Self-pay | Admitting: Orthopedic Surgery

## 2021-05-10 ENCOUNTER — Non-Acute Institutional Stay: Payer: Medicare HMO | Admitting: Orthopedic Surgery

## 2021-05-10 DIAGNOSIS — E7800 Pure hypercholesterolemia, unspecified: Secondary | ICD-10-CM

## 2021-05-10 DIAGNOSIS — Z8542 Personal history of malignant neoplasm of other parts of uterus: Secondary | ICD-10-CM | POA: Diagnosis not present

## 2021-05-10 DIAGNOSIS — Z87898 Personal history of other specified conditions: Secondary | ICD-10-CM

## 2021-05-10 DIAGNOSIS — G301 Alzheimer's disease with late onset: Secondary | ICD-10-CM

## 2021-05-10 DIAGNOSIS — G6289 Other specified polyneuropathies: Secondary | ICD-10-CM | POA: Diagnosis not present

## 2021-05-10 DIAGNOSIS — F419 Anxiety disorder, unspecified: Secondary | ICD-10-CM

## 2021-05-10 DIAGNOSIS — F0281 Dementia in other diseases classified elsewhere with behavioral disturbance: Secondary | ICD-10-CM | POA: Diagnosis not present

## 2021-05-10 NOTE — Progress Notes (Signed)
Location:   [place] Room Number: [employer] Service:  ALF [phone]) Provider:  Windell Moulding, NP    Patient Care Team: Seward Carol, MD as PCP - General (Internal Medicine) Christophe Louis, MD as Consulting Physician (Obstetrics and Gynecology) Dennie Bible, NP as Nurse Practitioner (Neurology) Polly Cobia, Dayton Bailiff, MD as Consulting Physician (Gynecologic Oncology)  Extended Emergency Contact Information Primary Emergency Contact: Ashby,Paul Address: [address]          [place], [place] [address] Johnnette Litter of [place] Phone: [phone] Mobile Phone: [phone] Relation: Spouse Secondary Emergency Contact: O'Keeffe,Joanne Mobile Phone: [phone] Relation: Daughter  Code Status:  DNR Goals of care: Advanced Directive information Advanced Directives 05/10/2021  Does Patient Have a Medical Advance Directive? Yes  Type of Paramedic of [place];Living will;Out of facility DNR (pink MOST or yellow form)  Does patient want to make changes to medical advance directive? No - Patient declined  Copy of [place] in Chart? Yes - validated most recent copy scanned in chart (See row information)  Would patient like information on creating a medical advance directive? -  Pre-existing out of facility DNR order (yellow form or pink MOST form) Pink MOST form placed in chart (order not valid for inpatient use)     Chief Complaint  Patient presents with   Transitions Of Care    Patient transitioning to assisted living.Patient having increased confusion.     HPI:  Pt is a 86 y.o. female seen today for medical management of chronic diseases.    Husband present during encounter.   She currently resides in assisted living at [facility]. Past medical history includes: hypertension, Alzheimer's dementia, peripheral neuropathy, history of SBO, hyperlipidemia.   05/31 she was transferred to assisted  living due to progressive memory loss. Initially followed by PCP, Dr. Delfina Redwood, they decided to switch to [facility] due to access to care.   Husband reports she began to have memory impairment in October 2010. She was unable to complete a sentence and short term memory was poor. Her brother died of Lewy Body Dementia at age 30. She is followed by neurologist, Dr. Krista Blue. She has had unsuccessful trials of Aricept and Namenda in the past. Side effects include nightmares and dizziness. In the past year, her husband was her main caregiver. At this time, she only takes her atorvastatin daily. She quit taking her medications about a year ago, husband reports some paranoia.   Husband presented past medication list:  Aspirin 81 mg daily  Atorvastatin 20 mg QOD, 10 mg QOD  Calcium 600 mg  & D3- 1 tablet daily  Refresh drops- 3-4 drops to each eye daily  Gas-X 80 mg tablet- as needed  Multivitamin- one daily  Miralax- as needed  Probiotic- daily  Peripheral neuropathy initial onset October 2010 while being treated for uterine cancer. In the past she use to be on gabapentin, but has not in over a year. Today,she reports her neuropathy has returned.  Describes lower leg pain as constant, numbness and burning. Pain intermittent throughout the day, worse at night. Asking to restart low dose gabapentin.   Since admission to AL, she has been observed by staff staying up at night and packing her things. Her husband stopped visiting in the evening in an effort to improve behavior, but she continues to pack her belongings. Staff also reports some agitation and increased anxiety at night.   Ambulates without assistive device.  No recent falls or injuries.   Recent blood pressures:  06/29- 137/77  06/22- 140/81  06/21- 134/80  No recent weight.   Nurse does not report any other concern, vitals stable.    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 Lyndon Code, MD DOB/Age Sep 09, 1935 (Age: 59) Gender: F Date Taken: 07/28/2009 Date Received: 07/28/2009  FINAL DIAGNOSIS  MICROSCOPIC EXAMINATION AND DIAGNOSIS  UTERUS, OVARIES AND FALLOPIAN TUBES, HYSTERECTOMY AND BILATERAL SALPINGO-OOPHORECTOMY: - CARCINOSARCOMA, ARISING FROM THE LOWER UTERINE SEGMENT, SPANNING 4.5 CM. - CARCINOSARCOMA IS 0.1 CM TO THE DEEP SOFT TISSUE RESECTION MARGIN. - CERVIX: ATROPHY AND CHRONIC INFLAMMATION. - ENDOMETRIUM: ATROPHIC APPEARING ENDOMETRIUM. - MYOMETRIUM: LEIOMYOMATA. - SEROSA: ESSENTIALLY UNREMARKABLE. - ADNEXA: PARATUBAL CYST  COMMENT Immunohistochemical stains were performed on the malignant neoplasm with the following results. The controls stained appropriately.  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 If you  have any questions, ask your nurse or doctor.          STOP taking these medications    carboxymethylcellulose 0.5 % Soln Commonly known as: REFRESH PLUS Stopped by: Yvonna Alanis, NP   diclofenac sodium 1 % Gel Commonly known as: VOLTAREN Stopped by: Yvonna Alanis, NP   gabapentin 100 MG capsule Commonly known as: NEURONTIN Stopped by: Yvonna Alanis, NP   HYDROcodone-acetaminophen 5-325 MG tablet Commonly known as: NORCO/VICODIN Stopped by: Yvonna Alanis, NP   lidocaine-prilocaine cream Commonly known as: EMLA Stopped by: Yvonna Alanis, NP   multivitamin with minerals Tabs tablet Stopped by: Yvonna Alanis, NP   ondansetron 4 MG disintegrating tablet Commonly known as: Zofran ODT Stopped by: Yvonna Alanis, NP   polyethylene glycol 17 g packet Commonly known as: MIRALAX / GLYCOLAX Stopped by: Yvonna Alanis, NP   PROBIOTIC DAILY PO Stopped by: Yvonna Alanis, NP   simethicone 80 MG chewable tablet Commonly known as: Gas-X Stopped by: Yvonna Alanis, NP   Vitamin D3 25 MCG (1000 UT) Caps Stopped by: Warren Lacy  Adria Dill, NP       TAKE these medications    atorvastatin 20 MG tablet Commonly known as: LIPITOR Take 10-20 mg by mouth daily. Alternate 10 mg (1/2 tablet) with 20 mg (1 tablet ) every other day        Review of Systems  Unable to perform ROS: Dementia   Immunization History  Administered Date(s) Administered   Influenza, High Dose Seasonal PF 07/26/2017   Influenza-Unspecified 08/06/2018, 07/08/2019, 07/22/2020   PFIZER(Purple Top)SARS-COV-2 Vaccination 12/25/2019, 01/17/2020, 08/14/2020, 03/24/2021   Pneumococcal Conjugate-13 10/01/2014   Pneumococcal Polysaccharide-23 07/02/2004   Td 07/02/2008   Tdap 09/02/2018   Zoster Recombinat (Shingrix) 10/17/2020, 01/17/2021   Zoster, Live 07/02/2008   Pertinent  Health Maintenance Due  Topic Date Due   DEXA SCAN  Never done   INFLUENZA VACCINE  06/12/2021   PNA vac Low Risk Adult  Completed   No flowsheet data  found. Functional Status Survey:    Vitals:   05/10/21 0946  BP: 140/81  Pulse: 73  Resp: 20  Temp: (!) 97.3 F (36.3 C)  SpO2: 96%   There is no height or weight on file to calculate BMI. Physical Exam Vitals reviewed.  Constitutional:      General: She is not in acute distress. HENT:     Head: Normocephalic.     Right Ear: There is no impacted cerumen.     Left Ear: There is no impacted cerumen.     Nose: Nose normal.     Mouth/Throat:     Mouth: Mucous membranes are moist.  Eyes:     General:        Right eye: No discharge.        Left eye: No discharge.  Neck:     Vascular: No carotid bruit.  Cardiovascular:     Rate and Rhythm: Normal rate and regular rhythm.     Pulses: Normal pulses.     Heart sounds: Normal heart sounds. No murmur heard. Pulmonary:     Effort: Pulmonary effort is normal. No respiratory distress.     Breath sounds: Normal breath sounds. No wheezing.  Abdominal:     General: Abdomen is flat. Bowel sounds are normal. There is no distension.     Palpations: Abdomen is soft.     Tenderness: There is no abdominal tenderness.  Musculoskeletal:     Cervical back: Normal range of motion.     Right lower leg: No edema.     Left lower leg: No edema.  Lymphadenopathy:     Cervical: No cervical adenopathy.  Skin:    General: Skin is warm and dry.     Capillary Refill: Capillary refill takes less than 2 seconds.  Neurological:     General: No focal deficit present.     Mental Status: She is alert. Mental status is at baseline.     Motor: Weakness present.     Gait: Gait abnormal.  Psychiatric:        Mood and Affect: Mood normal.        Behavior: Behavior normal.        Cognition and Memory: Memory is impaired.    Labs reviewed: No results for input(s): NA, K, CL, CO2, GLUCOSE, BUN, CREATININE, CALCIUM, MG, PHOS in the last 8760 hours. No results for input(s): AST, ALT, ALKPHOS, BILITOT, PROT, ALBUMIN in the last 8760 hours. No results for  input(s): WBC, NEUTROABS, HGB, HCT, MCV, PLT in the last 8760 hours. Lab Results  Component Value Date   TSH 3.900 08/14/2018   Lab Results  Component Value Date   HGBA1C 5.4 08/14/2018   No results found for: CHOL, HDL, LDLCALC, LDLDIRECT, TRIG, CHOLHDL  Significant Diagnostic Results in last 30 days:  No results found.  Assessment/Plan 1. Late onset Alzheimer's dementia with behavioral disturbance ([place]) - onset 2010, exacerbated by chemotherapy treatments due to uterine cancer - MRI brain without contrast 2011- generalized atrophy and small vessel disease - unsuccessful trial of Aricept and Namenda d/t nightmares and dizziness - she has been packing her belongings every night since more to AL - depakote 125 mg daily - PT/OT/ST consult - cbc/diff - cmp  2. Other polyneuropathy - ongoing, believed to be secondary to chemotherapy - past history of using gabapentin - reports numbness and burning today - start gabapentin 100 mg po bid  3. History of uterine cancer - s/p vaginal hysterectomy and BSO for pelvic prolapse in 2010  4. History of neoplasm of breast - involved left and right later sides  5. Pure hypercholesterolemia - LDL unknown - cont atorvastatin 40 mg daily - lipid panel  6. Anxiety - increased anxiety at night  - ativan 0.5 qhs prn x 14 days    Family/ staff Communication: plan discussed with husband, patient and nurse  Labs/tests ordered:  cbc/diff, cmp, lipid panel

## 2021-05-11 ENCOUNTER — Other Ambulatory Visit: Payer: Self-pay | Admitting: Orthopedic Surgery

## 2021-05-11 ENCOUNTER — Telehealth: Payer: Self-pay

## 2021-05-11 DIAGNOSIS — F0390 Unspecified dementia without behavioral disturbance: Secondary | ICD-10-CM | POA: Diagnosis not present

## 2021-05-11 DIAGNOSIS — G309 Alzheimer's disease, unspecified: Secondary | ICD-10-CM

## 2021-05-11 DIAGNOSIS — R451 Restlessness and agitation: Secondary | ICD-10-CM

## 2021-05-11 DIAGNOSIS — E039 Hypothyroidism, unspecified: Secondary | ICD-10-CM | POA: Diagnosis not present

## 2021-05-11 DIAGNOSIS — D649 Anemia, unspecified: Secondary | ICD-10-CM | POA: Diagnosis not present

## 2021-05-11 LAB — COMPREHENSIVE METABOLIC PANEL
Albumin: 3.9 (ref 3.5–5.0)
Calcium: 9.2 (ref 8.7–10.7)
Globulin: 2.2

## 2021-05-11 LAB — CBC AND DIFFERENTIAL
HCT: 39 (ref 36–46)
Hemoglobin: 12.7 (ref 12.0–16.0)
Neutrophils Absolute: 2425
Platelets: 327 (ref 150–399)
WBC: 5

## 2021-05-11 LAB — BASIC METABOLIC PANEL
BUN: 12 (ref 4–21)
CO2: 27 — AB (ref 13–22)
Chloride: 109 — AB (ref 99–108)
Creatinine: 0.6 (ref 0.5–1.1)
Glucose: 78
Potassium: 3.9 (ref 3.4–5.3)
Sodium: 142 (ref 137–147)

## 2021-05-11 LAB — HEPATIC FUNCTION PANEL
ALT: 11 (ref 7–35)
AST: 14 (ref 13–35)
Alkaline Phosphatase: 72 (ref 25–125)
Bilirubin, Total: 0.5

## 2021-05-11 LAB — CBC: RBC: 4.06 (ref 3.87–5.11)

## 2021-05-11 LAB — VITAMIN B12: Vitamin B-12: 212

## 2021-05-11 LAB — TSH: TSH: 2.71 (ref 0.41–5.90)

## 2021-05-11 MED ORDER — LORAZEPAM 0.5 MG PO TABS: 0.5000 mg | ORAL_TABLET | Freq: Every evening | ORAL | 0 refills | Status: DC | PRN

## 2021-05-11 NOTE — Telephone Encounter (Signed)
Incoming refill request received via fax for Lorazepam 0.5 mg take 1 tablet by mouth every night at bedtime as needed for 14 days   Patient was seen yesterday and medication requested is not on active medication list.  I will send request to Yvonna Alanis, NP for review.   Please advise

## 2021-05-11 NOTE — Telephone Encounter (Signed)
Script sent  

## 2021-05-16 ENCOUNTER — Non-Acute Institutional Stay: Payer: Medicare HMO | Admitting: Nurse Practitioner

## 2021-05-16 DIAGNOSIS — G309 Alzheimer's disease, unspecified: Secondary | ICD-10-CM

## 2021-05-16 DIAGNOSIS — F0391 Unspecified dementia with behavioral disturbance: Secondary | ICD-10-CM | POA: Diagnosis not present

## 2021-05-16 DIAGNOSIS — E538 Deficiency of other specified B group vitamins: Secondary | ICD-10-CM | POA: Diagnosis not present

## 2021-05-16 DIAGNOSIS — E7800 Pure hypercholesterolemia, unspecified: Secondary | ICD-10-CM

## 2021-05-16 DIAGNOSIS — F0280 Dementia in other diseases classified elsewhere without behavioral disturbance: Secondary | ICD-10-CM

## 2021-05-16 DIAGNOSIS — I10 Essential (primary) hypertension: Secondary | ICD-10-CM

## 2021-05-16 DIAGNOSIS — G6289 Other specified polyneuropathies: Secondary | ICD-10-CM | POA: Diagnosis not present

## 2021-05-16 DIAGNOSIS — F0394 Unspecified dementia, unspecified severity, with anxiety: Secondary | ICD-10-CM | POA: Insufficient documentation

## 2021-05-16 NOTE — Assessment & Plan Note (Signed)
Takes Atorvastatin.

## 2021-05-16 NOTE — Assessment & Plan Note (Signed)
treated with Depakote 125mg  qd, Loraezpam 0.5 hs prn x 14 days since 05/10/21

## 2021-05-16 NOTE — Progress Notes (Signed)
Location:   [place] Room Number: 68 Place of Service:  ALF (13) Provider: Lennie Odor Chenel Wernli NP  Yvonna Alanis, NP  Patient Care Team: Yvonna Alanis, NP as PCP - General (Adult Health Nurse Practitioner) Christophe Louis, MD as Consulting Physician (Obstetrics and Gynecology) Dennie Bible, NP as Nurse Practitioner (Neurology) Polly Cobia, [place] Bailiff, MD as Consulting Physician (Gynecologic Oncology)  Extended Emergency Contact Information Primary Emergency Contact: Ashby,Paul Address: [address]          [place], [place] [address] Johnnette Litter of [place] Phone: [phone] Mobile Phone: [phone] Relation: Spouse Secondary Emergency Contact: O'Keeffe,Joanne Mobile Phone: [phone] Relation: Daughter  Code Status:  DNR Goals of care: Advanced Directive information Advanced Directives 05/10/2021  Does Patient Have a Medical Advance Directive? Yes  Type of Paramedic of [place];Living will;Out of facility DNR (pink MOST or yellow form)  Does patient want to make changes to medical advance directive? No - Patient declined  Copy of [place] in Chart? Yes - validated most recent copy scanned in chart (See row information)  Would patient like information on creating a medical advance directive? -  Pre-existing out of facility DNR order (yellow form or pink MOST form) Pink MOST form placed in chart (order not valid for inpatient use)     Chief Complaint  Patient presents with   Acute Visit    Vitamin B12 deficiency.      HPI:  Pt is a 86 y.o. female seen today for an acute visit for Vitamin B12 deficiency, Vit B12 level 212 05/11/21.   Alzheimer's dementia, onset 2010, exacerbated by chemotherapy for uterine cancer, MRI brain 2011 atrophy, small  vessel dx, under Neurology evaluation, paranoia, agitation, packing, anxious at night, did not tolerate Memantine, Donepezil.   Anxiety: treated with Depakote 125mg  qd,  Loraezpam 0.5 hs prn x 14 days since 05/10/21  HTN, no meds.   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 Lyndon Code, MD DOB/Age 02/19/1935 (Age: 47) Gender: F Date Taken: 07/28/2009 Date Received: 07/28/2009  FINAL DIAGNOSIS  MICROSCOPIC EXAMINATION AND DIAGNOSIS  UTERUS, OVARIES AND FALLOPIAN TUBES, HYSTERECTOMY AND BILATERAL SALPINGO-OOPHORECTOMY: - CARCINOSARCOMA, ARISING FROM THE LOWER UTERINE SEGMENT, SPANNING 4.5 CM. - CARCINOSARCOMA IS 0.1 CM TO THE DEEP SOFT TISSUE RESECTION MARGIN. - CERVIX: ATROPHY AND CHRONIC INFLAMMATION. - ENDOMETRIUM: ATROPHIC APPEARING ENDOMETRIUM. - MYOMETRIUM: LEIOMYOMATA. - SEROSA: ESSENTIALLY UNREMARKABLE. - ADNEXA: PARATUBAL CYST  COMMENT Immunohistochemical stains were performed on the malignant neoplasm with the following results. The controls stained appropriately.  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)    Past Surgical History:  Procedure Laterality Date   ABDOMINAL HYSTERECTOMY  2010   Dr Christophe Louis   ANKLE SURGERY Left    HERNIA REPAIR     as a child   MASTECTOMY  PARTIAL / LUMPECTOMY Left 1990   Breast sarcoma    Allergies  Allergen Reactions   Sulfa Antibiotics Anaphylaxis    Allergies as of 05/16/2021       Reactions   Sulfa Antibiotics Anaphylaxis        Medication List        Accurate as of May 16, 2021 11:59 PM. If you have any questions, ask your nurse or doctor.          atorvastatin 20 MG tablet Commonly known as: LIPITOR Take 10-20 mg by mouth daily. Alternate 10 mg (1/2 tablet) with 20 mg (1 tablet ) every other day   LORazepam 0.5 MG tablet Commonly known as: ATIVAN Take 1 tablet (0.5 mg total) by mouth at bedtime as needed for anxiety.        Review of Systems  Unable to perform ROS: Dementia   Immunization History  Administered Date(s) Administered   Influenza, High Dose Seasonal PF 07/26/2017   Influenza-Unspecified 08/06/2018, 07/08/2019, 07/22/2020   PFIZER(Purple Top)SARS-COV-2 Vaccination 12/25/2019, 01/17/2020, 08/14/2020, 03/24/2021   Pneumococcal Conjugate-13 10/01/2014   Pneumococcal Polysaccharide-23 07/02/2004   Td 07/02/2008   Tdap 09/02/2018   Zoster Recombinat (Shingrix) 10/17/2020, 01/17/2021   Zoster, Live 07/02/2008   Pertinent  Health Maintenance Due  Topic Date Due   DEXA SCAN  Never done   INFLUENZA VACCINE  06/12/2021   PNA vac Low Risk Adult  Completed   No flowsheet data found. Functional Status Survey:    Vitals:   05/16/21 1326  BP: 137/77  Pulse: 72  Resp: 16  Temp: 97.8 F (36.6 C)  SpO2: 97%   There is no height or weight on file to calculate BMI. Physical Exam Constitutional:      Appearance: Normal appearance.  HENT:     Head: Normocephalic and atraumatic.     Nose: Nose normal.     Mouth/Throat:     Mouth: Mucous membranes are moist.  Eyes:     Extraocular Movements: Extraocular movements intact.     Conjunctiva/sclera: Conjunctivae normal.     Pupils: Pupils are equal, round, and reactive to light.  Cardiovascular:     Rate and Rhythm: Normal rate  and regular rhythm.     Heart sounds: No murmur heard. Pulmonary:     Effort: Pulmonary effort is normal.     Breath sounds: No wheezing, rhonchi or rales.  Abdominal:     General: Bowel sounds are normal.     Palpations: Abdomen is soft.     Tenderness: There is no right CVA tenderness, left CVA tenderness, guarding or rebound.  Musculoskeletal:     Cervical back: Normal range of motion and neck supple.     Right lower leg: No edema.     Left lower leg: No edema.  Skin:    General: Skin is warm and dry.  Neurological:     General: No focal deficit present.     Mental Status: She is alert. Mental status is at baseline.     Gait: Gait abnormal.     Comments: Oriented to person   Psychiatric:     Comments: Followed directions during my examination    Labs reviewed:  No results for input(s): NA, K, CL, CO2, GLUCOSE, BUN, CREATININE, CALCIUM, MG, PHOS in the last 8760 hours. No results for input(s): AST, ALT, ALKPHOS, BILITOT, PROT, ALBUMIN in the last 8760 hours. No results for input(s): WBC, NEUTROABS, HGB, HCT, MCV, PLT in the last 8760 hours. Lab Results  Component Value Date   TSH 3.900 08/14/2018   Lab Results  Component Value Date   HGBA1C 5.4 08/14/2018   No results found for: CHOL, HDL, LDLCALC, LDLDIRECT, TRIG, CHOLHDL  Significant Diagnostic Results in last 30 days:  No results found.  Assessment/Plan Vitamin B12 deficiency Vit B12 level 212 05/11/21. (5-10% of patients with values between 200-400 pg/ml may experience neuropsychiatric and hematologic abnormalities due to occult B12 deficiency) will start Vit B12 1052mcg po daily. Consider injectable Vit B12 if not better in  8months.   Alzheimer's disease with memory loss Alzheimer's dementia, onset 2010, exacerbated by chemotherapy for uterine cancer, MRI brain 2011 atrophy, small  vessel dx, under Neurology evaluation, paranoia, agitation, packing, anxious at night, did not tolerate Memantine, Donepezil.    Anxiety due to dementia [facility]) treated with Depakote 125mg  qd, Loraezpam 0.5 hs prn x 14 days since 05/10/21  HTN (hypertension) Controlled, no meds/   Peripheral neuropathy Peripheral neuropathy pain, numbness, burning, restarted Gabapentin,  Onset 2010  Hypercholesterolemia Takes Atorvastatin.     Family/ staff Communication: plan of care reviewed with the patient and charge nurse.   Labs/tests ordered:  Vitamin B12 3 mos.   Time spend 40 minutes.

## 2021-05-16 NOTE — Assessment & Plan Note (Signed)
Peripheral neuropathy pain, numbness, burning, restarted Gabapentin,  Onset 2010

## 2021-05-16 NOTE — Assessment & Plan Note (Signed)
Controlled, no meds/

## 2021-05-16 NOTE — Assessment & Plan Note (Signed)
Vit B12 level 212 05/11/21. (5-10% of patients with values between 200-400 pg/ml may experience neuropsychiatric and hematologic abnormalities due to occult B12 deficiency) will start Vit B12 1067mcg po daily. Consider injectable Vit B12 if not better in  63months.

## 2021-05-16 NOTE — Assessment & Plan Note (Signed)
Alzheimer's dementia, onset 2010, exacerbated by chemotherapy for uterine cancer, MRI brain 2011 atrophy, small  vessel dx, under Neurology evaluation, paranoia, agitation, packing, anxious at night, did not tolerate Memantine, Donepezil.

## 2021-05-18 ENCOUNTER — Encounter: Payer: Self-pay | Admitting: Nurse Practitioner

## 2021-06-01 DIAGNOSIS — C541 Malignant neoplasm of endometrium: Secondary | ICD-10-CM | POA: Diagnosis not present

## 2021-06-01 DIAGNOSIS — K6289 Other specified diseases of anus and rectum: Secondary | ICD-10-CM | POA: Diagnosis not present

## 2021-06-05 ENCOUNTER — Non-Acute Institutional Stay: Payer: Medicare HMO | Admitting: Orthopedic Surgery

## 2021-06-05 ENCOUNTER — Encounter: Payer: Self-pay | Admitting: Orthopedic Surgery

## 2021-06-05 DIAGNOSIS — F0280 Dementia in other diseases classified elsewhere without behavioral disturbance: Secondary | ICD-10-CM | POA: Diagnosis not present

## 2021-06-05 DIAGNOSIS — K6289 Other specified diseases of anus and rectum: Secondary | ICD-10-CM

## 2021-06-05 DIAGNOSIS — F0391 Unspecified dementia with behavioral disturbance: Secondary | ICD-10-CM | POA: Diagnosis not present

## 2021-06-05 DIAGNOSIS — F03911 Unspecified dementia, unspecified severity, with agitation: Secondary | ICD-10-CM

## 2021-06-05 DIAGNOSIS — G309 Alzheimer's disease, unspecified: Secondary | ICD-10-CM

## 2021-06-05 NOTE — Progress Notes (Signed)
Location:   [facility] [place] Room Number: [place] of Service:  ALF [phone]) Provider:  Landon Bassford Eusebio Friendly, Gerhart Ruggieri Johnette Abraham, NP  Patient Care Team: Yvonna Alanis, NP as PCP - General (Adult Health Nurse Practitioner) Christophe Louis, MD as Consulting Physician (Obstetrics and Gynecology) Dennie Bible, NP as Nurse Practitioner (Neurology) Polly Cobia, [place] Bailiff, MD as Consulting Physician (Gynecologic Oncology)  Extended Emergency Contact Information Primary Emergency Contact: Ashby,Paul Address: [address]          [place], [place] [address] [place] Phone: [phone] Mobile Phone: [phone] Relation: Spouse Secondary Emergency Contact: O'Keeffe,Joanne Mobile Phone: [phone] Relation: Daughter  Code Status:  DNR Goals of care: Advanced Directive information Advanced Directives 06/05/2021  Does Patient Have a Medical Advance Directive? Yes  Type of Advance Directive Living will;Healthcare Power of [place];Out of facility DNR (pink MOST or yellow form)  Does patient want to make changes to medical advance directive? No - Patient declined  Copy of [place] in Chart? Yes - validated most recent copy scanned in chart (See row information)  Would patient like information on creating a medical advance directive? -  Pre-existing out of facility DNR order (yellow form or pink MOST form) Pink MOST form placed in chart (order not valid for inpatient use)     Chief Complaint  Patient presents with   Acute Visit    Patient has been having increased agitation.    HPI:  Pt is a 86 y.o. female seen today for an acute visit for increased agitation.   She currently resides in assisted living at [facility]. Past medical history includes: hypertension, Alzheimer's dementia, peripheral neuropathy, history of SBO, hyperlipidemia.  Husband present during encounter.   Alzheimer's- in the past 2 weeks she has become more  agitated in the late afternoon and evening. She has been noted to call her husband in a panic and ask "when are you coming to get me." Depakote 125 mg daily. Very pleasant today, followed commands.   Uterine cancer- s/p vaginal hysterectomy and BSO for pelvic prolapse 2010. 07/21 seen by Dr. Polly Cobia, rectovaginal nodule noted. Referral to [facility] placed for colonoscopy. She has not had procedure done in 17 years. Pelvic MRI also recommended.   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   No recent falls or injuries. Ambulates on her own.   Nurse does not report any other concerns, vital stable.     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 Lyndon Code, MD DOB/Age November 15, 1934 (Age: 89) Gender: F Date Taken: 07/28/2009 Date Received: 07/28/2009  FINAL DIAGNOSIS  MICROSCOPIC EXAMINATION AND DIAGNOSIS  UTERUS, OVARIES AND FALLOPIAN TUBES, HYSTERECTOMY AND BILATERAL SALPINGO-OOPHORECTOMY: - CARCINOSARCOMA, ARISING FROM THE LOWER UTERINE SEGMENT, SPANNING 4.5 CM. - CARCINOSARCOMA IS 0.1 CM TO THE DEEP SOFT TISSUE RESECTION MARGIN. - CERVIX: ATROPHY AND CHRONIC INFLAMMATION. - ENDOMETRIUM: ATROPHIC APPEARING ENDOMETRIUM. - MYOMETRIUM: LEIOMYOMATA. - SEROSA: ESSENTIALLY UNREMARKABLE. - ADNEXA: PARATUBAL CYST  COMMENT Immunohistochemical stains were performed on the malignant neoplasm with the following results. The controls stained appropriately.  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 If you have any questions, ask your nurse or doctor.          STOP taking these medications    LORazepam 0.5 MG tablet Commonly known as: ATIVAN Stopped by: Yvonna Alanis, NP       TAKE these medications    atorvastatin 10 MG tablet Commonly known as: LIPITOR Take 10 mg by mouth every other day.   atorvastatin 20 MG tablet Commonly known as: LIPITOR Take 20 mg by mouth every other day. Alternate 10 mg (1/2 tablet) with 20 mg (1 tablet ) every other day   cyanocobalamin 1000 MCG tablet Take 1,000 mcg by mouth daily.   divalproex 125 MG DR tablet Commonly known as: DEPAKOTE Take 125 mg by mouth daily.   gabapentin 100 MG capsule Commonly known as: NEURONTIN Take 100 mg by mouth 2 (two) times daily.        Review of Systems  Unable to perform ROS: Dementia   Immunization History  Administered Date(s) Administered   Influenza, High Dose Seasonal PF 07/26/2017   Influenza-Unspecified 08/06/2018, 07/08/2019, 07/22/2020   PFIZER(Purple Top)SARS-COV-2 Vaccination 12/25/2019, 01/17/2020, 08/14/2020, 03/24/2021   Pneumococcal Conjugate-13 10/01/2014   Pneumococcal Polysaccharide-23 07/02/2004   Td 07/02/2008   Tdap 09/02/2018    Zoster Recombinat (Shingrix) 10/17/2020, 01/17/2021   Zoster, Live 07/02/2008   Pertinent  Health Maintenance Due  Topic Date Due   DEXA SCAN  Never done   INFLUENZA VACCINE  06/12/2021   PNA vac Low Risk Adult  Completed   No flowsheet data found. Functional Status Survey:    Vitals:   06/05/21 1111  BP: (!) 152/81  Pulse: 73  Resp: 20  Temp: 97.9 F (36.6 C)  SpO2: 97%  Height: '5\' 5"'$  (1.651 m)   Body mass index is 19.89 kg/m. Physical Exam Vitals reviewed.  Constitutional:      General: She is not in acute distress. HENT:     Head: Normocephalic.  Cardiovascular:     Rate and Rhythm: Normal rate.     Pulses: Normal pulses.     Heart sounds: Normal heart sounds. No murmur heard. Pulmonary:     Effort: Pulmonary effort is normal. No respiratory distress.     Breath sounds: Normal breath sounds. No wheezing.  Abdominal:     Palpations: There is no mass.  Musculoskeletal:  Right lower leg: No edema.     Left lower leg: No edema.  Skin:    General: Skin is warm.     Capillary Refill: Capillary refill takes less than 2 seconds.  Neurological:     General: No focal deficit present.     Mental Status: She is alert. Mental status is at baseline.  Psychiatric:        Mood and Affect: Mood normal.        Behavior: Behavior normal.        Cognition and Memory: Memory is impaired.    Labs reviewed: No results for input(s): NA, K, CL, CO2, GLUCOSE, BUN, CREATININE, CALCIUM, MG, PHOS in the last 8760 hours. No results for input(s): AST, ALT, ALKPHOS, BILITOT, PROT, ALBUMIN in the last 8760 hours. No results for input(s): WBC, NEUTROABS, HGB, HCT, MCV, PLT in the last 8760 hours. Lab Results  Component Value Date   TSH 3.900 08/14/2018   Lab Results  Component Value Date   HGBA1C 5.4 08/14/2018   No results found for: CHOL, HDL, LDLCALC, LDLDIRECT, TRIG, CHOLHDL  Significant Diagnostic Results in last 30 days:  No results found.  Assessment/Plan 1.  Alzheimer's disease with memory loss - increased agitation within the past 2 weeks in evening - increase depakote 125 mg po bid - cmp in 2 weeks - ativan 0.5 mg po daily prn x 14 days - report ativan use to provider in 2 weeks  2. Agitation due to dementia [facility]) - see above  3. Nodule of rectum - followed by Dr. Polly Cobia for uterine cancer - rectovaginal nodule noted 07/21 - referral to [facility] made for colonoscopy - MRI pelvis recommended per oncology    Family/ staff Communication: plan discussed with patient, husband and nurse  Labs/tests ordered:  cmp in 2 weeks

## 2021-06-08 DIAGNOSIS — Z9181 History of falling: Secondary | ICD-10-CM | POA: Diagnosis not present

## 2021-06-08 DIAGNOSIS — F0280 Dementia in other diseases classified elsewhere without behavioral disturbance: Secondary | ICD-10-CM | POA: Diagnosis not present

## 2021-06-08 DIAGNOSIS — G3184 Mild cognitive impairment, so stated: Secondary | ICD-10-CM | POA: Diagnosis not present

## 2021-06-08 DIAGNOSIS — G309 Alzheimer's disease, unspecified: Secondary | ICD-10-CM | POA: Diagnosis not present

## 2021-06-08 DIAGNOSIS — M6281 Muscle weakness (generalized): Secondary | ICD-10-CM | POA: Diagnosis not present

## 2021-06-08 DIAGNOSIS — R4789 Other speech disturbances: Secondary | ICD-10-CM | POA: Diagnosis not present

## 2021-06-08 DIAGNOSIS — R2681 Unsteadiness on feet: Secondary | ICD-10-CM | POA: Diagnosis not present

## 2021-06-09 DIAGNOSIS — G3184 Mild cognitive impairment, so stated: Secondary | ICD-10-CM | POA: Diagnosis not present

## 2021-06-09 DIAGNOSIS — G309 Alzheimer's disease, unspecified: Secondary | ICD-10-CM | POA: Diagnosis not present

## 2021-06-09 DIAGNOSIS — Z9181 History of falling: Secondary | ICD-10-CM | POA: Diagnosis not present

## 2021-06-09 DIAGNOSIS — R4789 Other speech disturbances: Secondary | ICD-10-CM | POA: Diagnosis not present

## 2021-06-09 DIAGNOSIS — M6281 Muscle weakness (generalized): Secondary | ICD-10-CM | POA: Diagnosis not present

## 2021-06-09 DIAGNOSIS — R2681 Unsteadiness on feet: Secondary | ICD-10-CM | POA: Diagnosis not present

## 2021-06-09 DIAGNOSIS — F0280 Dementia in other diseases classified elsewhere without behavioral disturbance: Secondary | ICD-10-CM | POA: Diagnosis not present

## 2021-06-12 DIAGNOSIS — Z9181 History of falling: Secondary | ICD-10-CM | POA: Diagnosis not present

## 2021-06-12 DIAGNOSIS — M6281 Muscle weakness (generalized): Secondary | ICD-10-CM | POA: Diagnosis not present

## 2021-06-12 DIAGNOSIS — G309 Alzheimer's disease, unspecified: Secondary | ICD-10-CM | POA: Diagnosis not present

## 2021-06-12 DIAGNOSIS — R2681 Unsteadiness on feet: Secondary | ICD-10-CM | POA: Diagnosis not present

## 2021-06-12 DIAGNOSIS — G3184 Mild cognitive impairment, so stated: Secondary | ICD-10-CM | POA: Diagnosis not present

## 2021-06-12 DIAGNOSIS — R4789 Other speech disturbances: Secondary | ICD-10-CM | POA: Diagnosis not present

## 2021-06-12 DIAGNOSIS — F0280 Dementia in other diseases classified elsewhere without behavioral disturbance: Secondary | ICD-10-CM | POA: Diagnosis not present

## 2021-06-13 DIAGNOSIS — G3184 Mild cognitive impairment, so stated: Secondary | ICD-10-CM | POA: Diagnosis not present

## 2021-06-13 DIAGNOSIS — F0280 Dementia in other diseases classified elsewhere without behavioral disturbance: Secondary | ICD-10-CM | POA: Diagnosis not present

## 2021-06-13 DIAGNOSIS — R4789 Other speech disturbances: Secondary | ICD-10-CM | POA: Diagnosis not present

## 2021-06-13 DIAGNOSIS — Z9181 History of falling: Secondary | ICD-10-CM | POA: Diagnosis not present

## 2021-06-13 DIAGNOSIS — R2681 Unsteadiness on feet: Secondary | ICD-10-CM | POA: Diagnosis not present

## 2021-06-13 DIAGNOSIS — M6281 Muscle weakness (generalized): Secondary | ICD-10-CM | POA: Diagnosis not present

## 2021-06-13 DIAGNOSIS — G309 Alzheimer's disease, unspecified: Secondary | ICD-10-CM | POA: Diagnosis not present

## 2021-06-14 DIAGNOSIS — R4789 Other speech disturbances: Secondary | ICD-10-CM | POA: Diagnosis not present

## 2021-06-14 DIAGNOSIS — M6281 Muscle weakness (generalized): Secondary | ICD-10-CM | POA: Diagnosis not present

## 2021-06-14 DIAGNOSIS — Z9181 History of falling: Secondary | ICD-10-CM | POA: Diagnosis not present

## 2021-06-14 DIAGNOSIS — R2681 Unsteadiness on feet: Secondary | ICD-10-CM | POA: Diagnosis not present

## 2021-06-14 DIAGNOSIS — F0280 Dementia in other diseases classified elsewhere without behavioral disturbance: Secondary | ICD-10-CM | POA: Diagnosis not present

## 2021-06-14 DIAGNOSIS — G309 Alzheimer's disease, unspecified: Secondary | ICD-10-CM | POA: Diagnosis not present

## 2021-06-14 DIAGNOSIS — G3184 Mild cognitive impairment, so stated: Secondary | ICD-10-CM | POA: Diagnosis not present

## 2021-06-15 ENCOUNTER — Encounter: Payer: Self-pay | Admitting: Internal Medicine

## 2021-06-15 ENCOUNTER — Non-Acute Institutional Stay: Payer: Medicare HMO | Admitting: Internal Medicine

## 2021-06-15 DIAGNOSIS — E7800 Pure hypercholesterolemia, unspecified: Secondary | ICD-10-CM | POA: Diagnosis not present

## 2021-06-15 DIAGNOSIS — I10 Essential (primary) hypertension: Secondary | ICD-10-CM

## 2021-06-15 DIAGNOSIS — G309 Alzheimer's disease, unspecified: Secondary | ICD-10-CM

## 2021-06-15 DIAGNOSIS — F0391 Unspecified dementia with behavioral disturbance: Secondary | ICD-10-CM

## 2021-06-15 DIAGNOSIS — E538 Deficiency of other specified B group vitamins: Secondary | ICD-10-CM

## 2021-06-15 DIAGNOSIS — F0280 Dementia in other diseases classified elsewhere without behavioral disturbance: Secondary | ICD-10-CM | POA: Diagnosis not present

## 2021-06-15 DIAGNOSIS — G6289 Other specified polyneuropathies: Secondary | ICD-10-CM

## 2021-06-15 DIAGNOSIS — F03911 Unspecified dementia, unspecified severity, with agitation: Secondary | ICD-10-CM

## 2021-06-15 DIAGNOSIS — R112 Nausea with vomiting, unspecified: Secondary | ICD-10-CM | POA: Diagnosis not present

## 2021-06-15 DIAGNOSIS — F0394 Unspecified dementia, unspecified severity, with anxiety: Secondary | ICD-10-CM

## 2021-06-15 NOTE — Progress Notes (Signed)
Location: [place] Room Number: 32 Place of Service:  ALF [phone])  Provider: Veleta Miners MD  Code Status: Managed Care Goals of Care:  Advanced Directives 06/15/2021  Does Patient Have a Medical Advance Directive? Yes  Type of Advance Directive Living will;Healthcare Power of [place];Out of facility DNR (pink MOST or yellow form)  Does patient want to make changes to medical advance directive? No - Patient declined  Copy of [place] in Chart? Yes - validated most recent copy scanned in chart (See row information)  Would patient like information on creating a medical advance directive? -  Pre-existing out of facility DNR order (yellow form or pink MOST form) Pink MOST form placed in chart (order not valid for inpatient use)     Chief Complaint  Patient presents with   Acute Visit    Nausea    HPI: Patient is a 86 y.o. female seen today for an acute visit for Nausea and Vomiting for past 2 days  Patient has a history of breast cancer DCIS,  stage II uterine carcinosarcoma diagnosed at the time of vaginal hysterectomy,s/p chemo and radiation therapy, peripheral neuropathy, hypertension, HLD  History of progressive dementia most likely Alzheimer's has not tolerated Aricept and Namenda per neurology note. History of B12 deficiency Patient also has a history of episodic recurrent partial SBO/enteritis Possibly due to abdominal adhesions  Was noticed to have nausea  since tomorrow with abdominal discomfort.  Had 2 episodes of vomiting yesterday.  Has been in her bed since this morning.  Has been just tolerating clear liquids.  Unable to get much history from her but she states that she feels little better.  Feels weak and tired denies any abdominal pain.  Past Medical History:  Diagnosis Date   Abdominal adhesions    Alzheimer's disease with memory loss 06/25/2013   Back ache    Breast cancer [facility])    Carcinosarcoma of uterus s/p TAH BSO  2010 11/22/2012   REPORT OF SURGICAL PATHOLOGY  Case #: [id number] Patient Name: Laura Perkins Office Chart Number: N/A  MRN: [id number] Pathologist: Vonna Kotyk B. Lyndon Code, MD DOB/Age [birth year] (Age: 86) Gender: F Date Taken: 07/28/2009 Date Received: 07/28/2009  FINAL DIAGNOSIS  MICROSCOPIC EXAMINATION AND DIAGNOSIS  UTERUS, OVARIES AND FALLOPIAN TUBES, HYSTERECTOMY AND BILATERAL SALPINGO-OOPHORECTOMY: - CARCINOSARCOMA, ARISING FROM THE LOWER UTERINE SEGMENT, SPANNING 4.5 CM. - CARCINOSARCOMA IS 0.1 CM TO THE DEEP SOFT TISSUE RESECTION MARGIN. - CERVIX: ATROPHY AND CHRONIC INFLAMMATION. - ENDOMETRIUM: ATROPHIC APPEARING ENDOMETRIUM. - MYOMETRIUM: LEIOMYOMATA. - SEROSA: ESSENTIALLY UNREMARKABLE. - ADNEXA: PARATUBAL CYST  COMMENT Immunohistochemical stains were performed on the malignant neoplasm with the following results. The controls stained appropriately.  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   atorvastatin (LIPITOR) 20 MG tablet Take 20 mg by mouth every other day. Alternate 10 mg (1/2 tablet) with  20 mg (1 tablet ) every other day   cyanocobalamin 1000 MCG tablet Take 1,000 mcg by mouth daily.   divalproex (DEPAKOTE) 125 MG DR tablet Take 125 mg by mouth daily.   gabapentin (NEURONTIN) 100 MG capsule Take 100 mg by mouth 2 (two) times daily.   LORazepam (ATIVAN) 0.5 MG tablet Take 0.5 mg by mouth as needed for anxiety.   ondansetron (ZOFRAN-ODT) 4 MG disintegrating tablet Take 4 mg by mouth every 4 (four) hours as needed for nausea or vomiting.   No facility-administered encounter medications on file as of 06/15/2021.    Review of Systems:  Review of Systems  Constitutional:  Positive for activity change and appetite change.  HENT: Negative.    Respiratory: Negative.    Cardiovascular: Negative.   Gastrointestinal:  Positive for nausea.  Genitourinary: Negative.   Musculoskeletal: Negative.   Skin: Negative.   Neurological:  Positive for weakness.  Psychiatric/Behavioral:  Positive for confusion.    Health Maintenance  Topic Date Due   DEXA SCAN  Never done   INFLUENZA VACCINE  06/12/2021   COVID-19 Vaccine (5 - Booster for Pfizer series) 07/25/2021   TETANUS/TDAP  09/02/2028   PNA vac Low Risk Adult  Completed   Zoster Vaccines- Shingrix  Completed   HPV VACCINES  Aged Out    Physical Exam: Vitals:   06/15/21 1551  BP: 134/70  Pulse: 73  Resp: 18  Temp: (!) 97.5 F (36.4 C)  SpO2: 94%   There is no height or weight on file to calculate BMI. Physical Exam Vitals reviewed.  Constitutional:      Comments: Was sleepy and tired but woke up   HENT:     Head: Normocephalic.     Mouth/Throat:     Mouth: Mucous membranes are moist.     Pharynx: Oropharynx is clear.  Eyes:     Pupils: Pupils are equal, round, and reactive to light.  Cardiovascular:     Rate and Rhythm: Normal rate and regular rhythm.     Pulses: Normal pulses.     Heart sounds: Normal heart sounds.  Pulmonary:     Effort: Pulmonary effort is normal.     Breath sounds: Normal breath sounds.   Abdominal:     General: Abdomen is flat. There is no distension.     Palpations: Abdomen is soft.     Comments: C/o Mild Discomfort BS diminished  Musculoskeletal:        General: No swelling.     Cervical back: Neck supple.  Skin:    General: Skin is warm.  Neurological:     General: No focal deficit present.  Psychiatric:        Mood and Affect: Mood normal.        Thought Content: Thought content normal.    Labs reviewed: Basic Metabolic Panel: Recent Labs    05/11/21 0000  NA 142  K 3.9  CL 109*  CO2 27*  BUN 12  CREATININE 0.6  CALCIUM 9.2  TSH 2.71   Liver Function Tests:  Recent Labs    05/11/21 0000  AST 14  ALT 11  ALKPHOS 72  ALBUMIN 3.9   No results for input(s): LIPASE, AMYLASE in the last 8760 hours. No results for input(s): AMMONIA in the last 8760 hours. CBC: Recent Labs    05/11/21 0000  WBC 5.0  NEUTROABS 2,425.00  HGB 12.7  HCT 39  PLT 327   Lipid Panel: No results for input(s): CHOL, HDL, LDLCALC, TRIG, CHOLHDL, LDLDIRECT in the last 8760 hours. Lab Results  Component Value Date   HGBA1C 5.4 08/14/2018    Procedures since last visit: No results found.  Assessment/Plan 1. Nausea and vomiting, intractability of vomiting not specified, unspecified vomiting type Has h/o Intermittent SBO / Enteritis  Keep her on Clear Liquids today and increase as tolerated Hold her meds PRN Zofran 4 mg PRN for Nausea  Labs CBC, Hepatic Panel and BMP ordered  Later in the day patient was up and about asking to eat I have cancelled Abdominal Xray and will keep monitor   2. Alzheimer's disease with memory loss Has not tolerated Namenda and Aricept Supportive care for now  3. Agitation due to dementia [facility]) Recently Depakote was increased Dont think it is causing this Nausea Will continue  to monitor Repeat Heatic Panel  4. Vitamin B12 deficiency On Supplement  5. 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 Primary  hypertension Not on any Meds  7. Other polyneuropathy On Gabapentin  8. Hypercholesterolemia On Lipitor 9 Uterine Cancer Post Chemo and radiation in 2011 Follows with Dr Polly Cobia No Recurent disease per her note CA 125 in normal levels  10 Recta Vaginal Nodule Is suppose to see [facility] for possible further work up   Labs/tests ordered:   CBC,CMP,Hepatic panel  Next appt:  Visit date not found

## 2021-06-16 DIAGNOSIS — R2681 Unsteadiness on feet: Secondary | ICD-10-CM | POA: Diagnosis not present

## 2021-06-16 DIAGNOSIS — G3184 Mild cognitive impairment, so stated: Secondary | ICD-10-CM | POA: Diagnosis not present

## 2021-06-16 DIAGNOSIS — F0280 Dementia in other diseases classified elsewhere without behavioral disturbance: Secondary | ICD-10-CM | POA: Diagnosis not present

## 2021-06-16 DIAGNOSIS — E785 Hyperlipidemia, unspecified: Secondary | ICD-10-CM | POA: Diagnosis not present

## 2021-06-16 DIAGNOSIS — G309 Alzheimer's disease, unspecified: Secondary | ICD-10-CM | POA: Diagnosis not present

## 2021-06-16 DIAGNOSIS — R4789 Other speech disturbances: Secondary | ICD-10-CM | POA: Diagnosis not present

## 2021-06-16 DIAGNOSIS — M6281 Muscle weakness (generalized): Secondary | ICD-10-CM | POA: Diagnosis not present

## 2021-06-16 DIAGNOSIS — Z9181 History of falling: Secondary | ICD-10-CM | POA: Diagnosis not present

## 2021-06-16 LAB — HEPATIC FUNCTION PANEL
ALT: 8 U/L (ref 7–35)
AST: 13 (ref 13–35)
Alkaline Phosphatase: 63 (ref 25–125)
Bilirubin, Direct: 0.2 (ref 0.01–0.4)
Bilirubin, Total: 0.8

## 2021-06-16 LAB — COMPREHENSIVE METABOLIC PANEL: Albumin: 3.3 — AB (ref 3.5–5.0)

## 2021-06-19 DIAGNOSIS — G309 Alzheimer's disease, unspecified: Secondary | ICD-10-CM | POA: Diagnosis not present

## 2021-06-19 DIAGNOSIS — D649 Anemia, unspecified: Secondary | ICD-10-CM | POA: Diagnosis not present

## 2021-06-19 DIAGNOSIS — F0280 Dementia in other diseases classified elsewhere without behavioral disturbance: Secondary | ICD-10-CM | POA: Diagnosis not present

## 2021-06-19 DIAGNOSIS — Z9181 History of falling: Secondary | ICD-10-CM | POA: Diagnosis not present

## 2021-06-19 DIAGNOSIS — M6281 Muscle weakness (generalized): Secondary | ICD-10-CM | POA: Diagnosis not present

## 2021-06-19 DIAGNOSIS — R4789 Other speech disturbances: Secondary | ICD-10-CM | POA: Diagnosis not present

## 2021-06-19 DIAGNOSIS — K7290 Hepatic failure, unspecified without coma: Secondary | ICD-10-CM | POA: Diagnosis not present

## 2021-06-19 DIAGNOSIS — G3184 Mild cognitive impairment, so stated: Secondary | ICD-10-CM | POA: Diagnosis not present

## 2021-06-19 DIAGNOSIS — R2681 Unsteadiness on feet: Secondary | ICD-10-CM | POA: Diagnosis not present

## 2021-06-19 DIAGNOSIS — I10 Essential (primary) hypertension: Secondary | ICD-10-CM | POA: Diagnosis not present

## 2021-06-19 LAB — BASIC METABOLIC PANEL
BUN: 11 (ref 4–21)
CO2: 26 — AB (ref 13–22)
Chloride: 106 (ref 99–108)
Creatinine: 0.8 (ref 0.5–1.1)
Glucose: 76
Potassium: 4.1 mEq/L (ref 3.5–5.1)
Sodium: 144 (ref 137–147)

## 2021-06-19 LAB — COMPREHENSIVE METABOLIC PANEL
Albumin: 4 (ref 3.5–5.0)
Calcium: 9.4 (ref 8.7–10.7)
Globulin: 2.6
eGFR: 73

## 2021-06-19 LAB — CBC: RBC: 4.11 (ref 3.87–5.11)

## 2021-06-19 LAB — CBC AND DIFFERENTIAL
HCT: 41 (ref 36–46)
Hemoglobin: 13.2 (ref 12.0–16.0)
Neutrophils Absolute: 2415
Platelets: 332 10*3/uL (ref 150–400)
WBC: 4.6

## 2021-06-19 LAB — HEPATIC FUNCTION PANEL
ALT: 9 U/L (ref 7–35)
AST: 13 (ref 13–35)
Alkaline Phosphatase: 77 (ref 25–125)

## 2021-06-20 DIAGNOSIS — F0280 Dementia in other diseases classified elsewhere without behavioral disturbance: Secondary | ICD-10-CM | POA: Diagnosis not present

## 2021-06-20 DIAGNOSIS — R2681 Unsteadiness on feet: Secondary | ICD-10-CM | POA: Diagnosis not present

## 2021-06-20 DIAGNOSIS — M6281 Muscle weakness (generalized): Secondary | ICD-10-CM | POA: Diagnosis not present

## 2021-06-20 DIAGNOSIS — R4789 Other speech disturbances: Secondary | ICD-10-CM | POA: Diagnosis not present

## 2021-06-20 DIAGNOSIS — G3184 Mild cognitive impairment, so stated: Secondary | ICD-10-CM | POA: Diagnosis not present

## 2021-06-20 DIAGNOSIS — G309 Alzheimer's disease, unspecified: Secondary | ICD-10-CM | POA: Diagnosis not present

## 2021-06-20 DIAGNOSIS — Z9181 History of falling: Secondary | ICD-10-CM | POA: Diagnosis not present

## 2021-06-21 DIAGNOSIS — F0280 Dementia in other diseases classified elsewhere without behavioral disturbance: Secondary | ICD-10-CM | POA: Diagnosis not present

## 2021-06-21 DIAGNOSIS — M6281 Muscle weakness (generalized): Secondary | ICD-10-CM | POA: Diagnosis not present

## 2021-06-21 DIAGNOSIS — Z9181 History of falling: Secondary | ICD-10-CM | POA: Diagnosis not present

## 2021-06-21 DIAGNOSIS — G3184 Mild cognitive impairment, so stated: Secondary | ICD-10-CM | POA: Diagnosis not present

## 2021-06-21 DIAGNOSIS — R2681 Unsteadiness on feet: Secondary | ICD-10-CM | POA: Diagnosis not present

## 2021-06-21 DIAGNOSIS — G309 Alzheimer's disease, unspecified: Secondary | ICD-10-CM | POA: Diagnosis not present

## 2021-06-21 DIAGNOSIS — R4789 Other speech disturbances: Secondary | ICD-10-CM | POA: Diagnosis not present

## 2021-06-22 DIAGNOSIS — R4789 Other speech disturbances: Secondary | ICD-10-CM | POA: Diagnosis not present

## 2021-06-22 DIAGNOSIS — R2681 Unsteadiness on feet: Secondary | ICD-10-CM | POA: Diagnosis not present

## 2021-06-22 DIAGNOSIS — G3184 Mild cognitive impairment, so stated: Secondary | ICD-10-CM | POA: Diagnosis not present

## 2021-06-22 DIAGNOSIS — Z9181 History of falling: Secondary | ICD-10-CM | POA: Diagnosis not present

## 2021-06-22 DIAGNOSIS — F0280 Dementia in other diseases classified elsewhere without behavioral disturbance: Secondary | ICD-10-CM | POA: Diagnosis not present

## 2021-06-22 DIAGNOSIS — G309 Alzheimer's disease, unspecified: Secondary | ICD-10-CM | POA: Diagnosis not present

## 2021-06-22 DIAGNOSIS — M6281 Muscle weakness (generalized): Secondary | ICD-10-CM | POA: Diagnosis not present

## 2021-06-23 DIAGNOSIS — R4789 Other speech disturbances: Secondary | ICD-10-CM | POA: Diagnosis not present

## 2021-06-23 DIAGNOSIS — G309 Alzheimer's disease, unspecified: Secondary | ICD-10-CM | POA: Diagnosis not present

## 2021-06-23 DIAGNOSIS — G3184 Mild cognitive impairment, so stated: Secondary | ICD-10-CM | POA: Diagnosis not present

## 2021-06-23 DIAGNOSIS — M6281 Muscle weakness (generalized): Secondary | ICD-10-CM | POA: Diagnosis not present

## 2021-06-23 DIAGNOSIS — F0280 Dementia in other diseases classified elsewhere without behavioral disturbance: Secondary | ICD-10-CM | POA: Diagnosis not present

## 2021-06-23 DIAGNOSIS — R2681 Unsteadiness on feet: Secondary | ICD-10-CM | POA: Diagnosis not present

## 2021-06-23 DIAGNOSIS — Z9181 History of falling: Secondary | ICD-10-CM | POA: Diagnosis not present

## 2021-06-26 DIAGNOSIS — G3184 Mild cognitive impairment, so stated: Secondary | ICD-10-CM | POA: Diagnosis not present

## 2021-06-26 DIAGNOSIS — G309 Alzheimer's disease, unspecified: Secondary | ICD-10-CM | POA: Diagnosis not present

## 2021-06-26 DIAGNOSIS — Z9181 History of falling: Secondary | ICD-10-CM | POA: Diagnosis not present

## 2021-06-26 DIAGNOSIS — M6281 Muscle weakness (generalized): Secondary | ICD-10-CM | POA: Diagnosis not present

## 2021-06-26 DIAGNOSIS — R4789 Other speech disturbances: Secondary | ICD-10-CM | POA: Diagnosis not present

## 2021-06-26 DIAGNOSIS — F0280 Dementia in other diseases classified elsewhere without behavioral disturbance: Secondary | ICD-10-CM | POA: Diagnosis not present

## 2021-06-26 DIAGNOSIS — R2681 Unsteadiness on feet: Secondary | ICD-10-CM | POA: Diagnosis not present

## 2021-06-28 DIAGNOSIS — R2681 Unsteadiness on feet: Secondary | ICD-10-CM | POA: Diagnosis not present

## 2021-06-28 DIAGNOSIS — M6281 Muscle weakness (generalized): Secondary | ICD-10-CM | POA: Diagnosis not present

## 2021-06-28 DIAGNOSIS — R4789 Other speech disturbances: Secondary | ICD-10-CM | POA: Diagnosis not present

## 2021-06-28 DIAGNOSIS — F0280 Dementia in other diseases classified elsewhere without behavioral disturbance: Secondary | ICD-10-CM | POA: Diagnosis not present

## 2021-06-28 DIAGNOSIS — Z9181 History of falling: Secondary | ICD-10-CM | POA: Diagnosis not present

## 2021-06-28 DIAGNOSIS — G3184 Mild cognitive impairment, so stated: Secondary | ICD-10-CM | POA: Diagnosis not present

## 2021-06-28 DIAGNOSIS — G309 Alzheimer's disease, unspecified: Secondary | ICD-10-CM | POA: Diagnosis not present

## 2021-06-29 DIAGNOSIS — G309 Alzheimer's disease, unspecified: Secondary | ICD-10-CM | POA: Diagnosis not present

## 2021-06-29 DIAGNOSIS — G3184 Mild cognitive impairment, so stated: Secondary | ICD-10-CM | POA: Diagnosis not present

## 2021-06-29 DIAGNOSIS — M6281 Muscle weakness (generalized): Secondary | ICD-10-CM | POA: Diagnosis not present

## 2021-06-29 DIAGNOSIS — Z9181 History of falling: Secondary | ICD-10-CM | POA: Diagnosis not present

## 2021-06-29 DIAGNOSIS — R2681 Unsteadiness on feet: Secondary | ICD-10-CM | POA: Diagnosis not present

## 2021-06-29 DIAGNOSIS — F0280 Dementia in other diseases classified elsewhere without behavioral disturbance: Secondary | ICD-10-CM | POA: Diagnosis not present

## 2021-06-29 DIAGNOSIS — R4789 Other speech disturbances: Secondary | ICD-10-CM | POA: Diagnosis not present

## 2021-06-30 DIAGNOSIS — H26493 Other secondary cataract, bilateral: Secondary | ICD-10-CM | POA: Diagnosis not present

## 2021-06-30 DIAGNOSIS — H04123 Dry eye syndrome of bilateral lacrimal glands: Secondary | ICD-10-CM | POA: Diagnosis not present

## 2021-06-30 DIAGNOSIS — R2681 Unsteadiness on feet: Secondary | ICD-10-CM | POA: Diagnosis not present

## 2021-06-30 DIAGNOSIS — M6281 Muscle weakness (generalized): Secondary | ICD-10-CM | POA: Diagnosis not present

## 2021-06-30 DIAGNOSIS — Z9181 History of falling: Secondary | ICD-10-CM | POA: Diagnosis not present

## 2021-06-30 DIAGNOSIS — G309 Alzheimer's disease, unspecified: Secondary | ICD-10-CM | POA: Diagnosis not present

## 2021-06-30 DIAGNOSIS — R4789 Other speech disturbances: Secondary | ICD-10-CM | POA: Diagnosis not present

## 2021-06-30 DIAGNOSIS — F0280 Dementia in other diseases classified elsewhere without behavioral disturbance: Secondary | ICD-10-CM | POA: Diagnosis not present

## 2021-06-30 DIAGNOSIS — H43813 Vitreous degeneration, bilateral: Secondary | ICD-10-CM | POA: Diagnosis not present

## 2021-06-30 DIAGNOSIS — G3184 Mild cognitive impairment, so stated: Secondary | ICD-10-CM | POA: Diagnosis not present

## 2021-06-30 DIAGNOSIS — Z961 Presence of intraocular lens: Secondary | ICD-10-CM | POA: Diagnosis not present

## 2021-06-30 DIAGNOSIS — H532 Diplopia: Secondary | ICD-10-CM | POA: Diagnosis not present

## 2021-07-03 DIAGNOSIS — Z9181 History of falling: Secondary | ICD-10-CM | POA: Diagnosis not present

## 2021-07-03 DIAGNOSIS — R2681 Unsteadiness on feet: Secondary | ICD-10-CM | POA: Diagnosis not present

## 2021-07-03 DIAGNOSIS — R4789 Other speech disturbances: Secondary | ICD-10-CM | POA: Diagnosis not present

## 2021-07-03 DIAGNOSIS — F0280 Dementia in other diseases classified elsewhere without behavioral disturbance: Secondary | ICD-10-CM | POA: Diagnosis not present

## 2021-07-03 DIAGNOSIS — G3184 Mild cognitive impairment, so stated: Secondary | ICD-10-CM | POA: Diagnosis not present

## 2021-07-03 DIAGNOSIS — M6281 Muscle weakness (generalized): Secondary | ICD-10-CM | POA: Diagnosis not present

## 2021-07-03 DIAGNOSIS — G309 Alzheimer's disease, unspecified: Secondary | ICD-10-CM | POA: Diagnosis not present

## 2021-07-04 DIAGNOSIS — R4789 Other speech disturbances: Secondary | ICD-10-CM | POA: Diagnosis not present

## 2021-07-04 DIAGNOSIS — Z9181 History of falling: Secondary | ICD-10-CM | POA: Diagnosis not present

## 2021-07-04 DIAGNOSIS — G3184 Mild cognitive impairment, so stated: Secondary | ICD-10-CM | POA: Diagnosis not present

## 2021-07-04 DIAGNOSIS — G309 Alzheimer's disease, unspecified: Secondary | ICD-10-CM | POA: Diagnosis not present

## 2021-07-04 DIAGNOSIS — F0280 Dementia in other diseases classified elsewhere without behavioral disturbance: Secondary | ICD-10-CM | POA: Diagnosis not present

## 2021-07-04 DIAGNOSIS — R2681 Unsteadiness on feet: Secondary | ICD-10-CM | POA: Diagnosis not present

## 2021-07-04 DIAGNOSIS — M6281 Muscle weakness (generalized): Secondary | ICD-10-CM | POA: Diagnosis not present

## 2021-07-05 DIAGNOSIS — G3184 Mild cognitive impairment, so stated: Secondary | ICD-10-CM | POA: Diagnosis not present

## 2021-07-05 DIAGNOSIS — Z9181 History of falling: Secondary | ICD-10-CM | POA: Diagnosis not present

## 2021-07-05 DIAGNOSIS — R2681 Unsteadiness on feet: Secondary | ICD-10-CM | POA: Diagnosis not present

## 2021-07-05 DIAGNOSIS — F0280 Dementia in other diseases classified elsewhere without behavioral disturbance: Secondary | ICD-10-CM | POA: Diagnosis not present

## 2021-07-05 DIAGNOSIS — R4789 Other speech disturbances: Secondary | ICD-10-CM | POA: Diagnosis not present

## 2021-07-05 DIAGNOSIS — G309 Alzheimer's disease, unspecified: Secondary | ICD-10-CM | POA: Diagnosis not present

## 2021-07-05 DIAGNOSIS — M6281 Muscle weakness (generalized): Secondary | ICD-10-CM | POA: Diagnosis not present

## 2021-07-06 DIAGNOSIS — R4789 Other speech disturbances: Secondary | ICD-10-CM | POA: Diagnosis not present

## 2021-07-06 DIAGNOSIS — Z9181 History of falling: Secondary | ICD-10-CM | POA: Diagnosis not present

## 2021-07-06 DIAGNOSIS — R2681 Unsteadiness on feet: Secondary | ICD-10-CM | POA: Diagnosis not present

## 2021-07-06 DIAGNOSIS — M6281 Muscle weakness (generalized): Secondary | ICD-10-CM | POA: Diagnosis not present

## 2021-07-06 DIAGNOSIS — G309 Alzheimer's disease, unspecified: Secondary | ICD-10-CM | POA: Diagnosis not present

## 2021-07-06 DIAGNOSIS — G3184 Mild cognitive impairment, so stated: Secondary | ICD-10-CM | POA: Diagnosis not present

## 2021-07-06 DIAGNOSIS — F0280 Dementia in other diseases classified elsewhere without behavioral disturbance: Secondary | ICD-10-CM | POA: Diagnosis not present

## 2021-07-07 DIAGNOSIS — F0280 Dementia in other diseases classified elsewhere without behavioral disturbance: Secondary | ICD-10-CM | POA: Diagnosis not present

## 2021-07-07 DIAGNOSIS — R4789 Other speech disturbances: Secondary | ICD-10-CM | POA: Diagnosis not present

## 2021-07-07 DIAGNOSIS — Z9181 History of falling: Secondary | ICD-10-CM | POA: Diagnosis not present

## 2021-07-07 DIAGNOSIS — G3184 Mild cognitive impairment, so stated: Secondary | ICD-10-CM | POA: Diagnosis not present

## 2021-07-07 DIAGNOSIS — M6281 Muscle weakness (generalized): Secondary | ICD-10-CM | POA: Diagnosis not present

## 2021-07-07 DIAGNOSIS — G309 Alzheimer's disease, unspecified: Secondary | ICD-10-CM | POA: Diagnosis not present

## 2021-07-07 DIAGNOSIS — R2681 Unsteadiness on feet: Secondary | ICD-10-CM | POA: Diagnosis not present

## 2021-07-10 ENCOUNTER — Non-Acute Institutional Stay: Payer: Medicare HMO | Admitting: Orthopedic Surgery

## 2021-07-10 ENCOUNTER — Encounter: Payer: Self-pay | Admitting: Orthopedic Surgery

## 2021-07-10 ENCOUNTER — Telehealth: Payer: Self-pay

## 2021-07-10 DIAGNOSIS — G309 Alzheimer's disease, unspecified: Secondary | ICD-10-CM | POA: Diagnosis not present

## 2021-07-10 DIAGNOSIS — M6281 Muscle weakness (generalized): Secondary | ICD-10-CM | POA: Diagnosis not present

## 2021-07-10 DIAGNOSIS — F0280 Dementia in other diseases classified elsewhere without behavioral disturbance: Secondary | ICD-10-CM | POA: Diagnosis not present

## 2021-07-10 DIAGNOSIS — F0391 Unspecified dementia with behavioral disturbance: Secondary | ICD-10-CM

## 2021-07-10 DIAGNOSIS — R4789 Other speech disturbances: Secondary | ICD-10-CM | POA: Diagnosis not present

## 2021-07-10 DIAGNOSIS — R2681 Unsteadiness on feet: Secondary | ICD-10-CM | POA: Diagnosis not present

## 2021-07-10 DIAGNOSIS — F03911 Unspecified dementia, unspecified severity, with agitation: Secondary | ICD-10-CM

## 2021-07-10 DIAGNOSIS — F0394 Unspecified dementia, unspecified severity, with anxiety: Secondary | ICD-10-CM

## 2021-07-10 DIAGNOSIS — Z9181 History of falling: Secondary | ICD-10-CM | POA: Diagnosis not present

## 2021-07-10 DIAGNOSIS — G3184 Mild cognitive impairment, so stated: Secondary | ICD-10-CM | POA: Diagnosis not present

## 2021-07-10 NOTE — Telephone Encounter (Signed)
Patient's husband called stating that patient is having more confusion and increased anxiety. He states that this is unusual for her. Please advise. Message routed to Windell Moulding, NP.

## 2021-07-10 NOTE — Telephone Encounter (Signed)
Ok, thanks.

## 2021-07-10 NOTE — Progress Notes (Signed)
Location:   [facility] Room Number: [place] of Service:  [id number] [phone]) Provider:  Windell Moulding, NP  Yvonna Alanis, NP  Patient Care Team: Yvonna Alanis, NP as PCP - General (Adult Health Nurse Practitioner) Christophe Louis, MD as Consulting Physician (Obstetrics and Gynecology) Dennie Bible, NP as Nurse Practitioner (Neurology) Polly Cobia, [place] Bailiff, MD as Consulting Physician (Gynecologic Oncology)  Extended Emergency Contact Information Primary Emergency Contact: Ashby,Paul Address: [address]          [place], [place] [address] Johnnette Litter of [place] Phone: [phone] Mobile Phone: [phone] Relation: Spouse Secondary Emergency Contact: O'Keeffe,Joanne Mobile Phone: [phone] Relation: Daughter  Code Status:  DNR Goals of care: Advanced Directive information Advanced Directives 07/10/2021  Does Patient Have a Medical Advance Directive? Yes  Type of Paramedic of [place];Living will;Out of facility DNR (pink MOST or yellow form)  Does patient want to make changes to medical advance directive? No - Patient declined  Copy of [place] in Chart? Yes - validated most recent copy scanned in chart (See row information)  Would patient like information on creating a medical advance directive? -  Pre-existing out of facility DNR order (yellow form or pink MOST form) Pink MOST form placed in chart (order not valid for inpatient use)     Chief Complaint  Patient presents with   Acute Visit    Patient presents for increased confusion and anxiety    HPI:  Pt is a 86 y.o. female seen today for an acute visit for due to increased anxiety.   She currently resides in assisted living at [facility] due to Alzheimer's dementia. Past medical history includes: hypertension, peripheral neuropathy, history of SBO, hyperlipidemia.  Husband reports increased anxiety around dinnertime. He has been receiving  phone calls multiple times at night from her. She will cry on the phone because she does not understand where she is. She will often ask her husband " where am I" or " come and get me." Today, she is very pleasant during our encounter. She is alert to self only. She takes depakote 125 mg bid.   Nursing staff reports intermittent episodes of anxiety and increased confusion in the past few weeks. Most episodes occurring in the evening.  No recent falls or injuries, ambulates on her own.    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 Lyndon Code, MD DOB/Age 09/20/1935 (Age: 71) Gender: F Date Taken: 07/28/2009 Date Received: 07/28/2009  FINAL DIAGNOSIS  MICROSCOPIC EXAMINATION AND DIAGNOSIS  UTERUS, OVARIES AND FALLOPIAN TUBES, HYSTERECTOMY AND BILATERAL SALPINGO-OOPHORECTOMY: - CARCINOSARCOMA, ARISING FROM THE LOWER UTERINE SEGMENT, SPANNING 4.5 CM. - CARCINOSARCOMA IS 0.1 CM TO THE DEEP SOFT TISSUE RESECTION MARGIN. - CERVIX: ATROPHY AND CHRONIC INFLAMMATION. - ENDOMETRIUM: ATROPHIC APPEARING ENDOMETRIUM. - MYOMETRIUM: LEIOMYOMATA. - SEROSA: ESSENTIALLY UNREMARKABLE. - ADNEXA: PARATUBAL CYST  COMMENT Immunohistochemical stains were performed on the malignant neoplasm with the following results. The controls stained appropriately.  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 If you have any questions, ask your nurse or doctor.          atorvastatin 10 MG tablet Commonly known as: LIPITOR Take 10 mg by mouth every other day.   atorvastatin 20 MG tablet Commonly known as: LIPITOR Take 20 mg by mouth every other day. Alternate 10 mg (1/2 tablet) with 20 mg (1 tablet ) every other day   cyanocobalamin 1000 MCG tablet Take 1,000 mcg by mouth daily.   divalproex 125 MG DR tablet Commonly known as: DEPAKOTE Take 125 mg by mouth 2 (two) times daily.   gabapentin 100 MG capsule Commonly known as: NEURONTIN Take 100 mg by mouth 2 (two) times daily.   LORazepam 0.5 MG tablet Commonly known as: ATIVAN Take 0.5 mg by mouth as needed for anxiety.   ondansetron 4 MG disintegrating tablet Commonly known as: ZOFRAN-ODT Take 4 mg by mouth every 4 (four) hours as needed for nausea or vomiting.        Review of Systems  Unable to perform ROS: Dementia   Immunization History  Administered Date(s) Administered   Influenza, High Dose Seasonal PF 07/26/2017   Influenza-Unspecified 08/06/2018, 07/08/2019, 07/22/2020   PFIZER(Purple Top)SARS-COV-2 Vaccination 12/25/2019, 01/17/2020, 08/14/2020, 03/24/2021   Pneumococcal Conjugate-13 10/01/2014   Pneumococcal Polysaccharide-23 07/02/2004   Td 07/02/2008   Tdap 09/02/2018   Zoster Recombinat (Shingrix) 10/17/2020, 01/17/2021    Zoster, Live 07/02/2008   Pertinent  Health Maintenance Due  Topic Date Due   DEXA SCAN  Never done   INFLUENZA VACCINE  06/12/2021   PNA vac Low Risk Adult  Completed   No flowsheet data found. Functional Status Survey:    Vitals:   07/10/21 1434  BP: 124/74  Pulse: 65  Resp: 16  Temp: (!) 97 F (36.1 C)  SpO2: 98%  Height: '5\' 5"'$  (1.651 m)   Body mass index is 19.89 kg/m. Physical Exam Vitals reviewed.  Constitutional:      General: She is not in acute distress. HENT:     Head: Normocephalic.  Cardiovascular:     Rate and Rhythm: Normal rate and regular rhythm.     Pulses: Normal pulses.     Heart sounds: Normal heart sounds. No murmur heard. Pulmonary:     Effort: Pulmonary effort is normal. No respiratory distress.     Breath sounds: Normal breath sounds. No wheezing.  Skin:    General: Skin is warm and dry.  Neurological:     General:  No focal deficit present.     Mental Status: She is alert. Mental status is at baseline.     Motor: No weakness.  Psychiatric:        Mood and Affect: Mood is anxious.        Behavior: Behavior normal.        Cognition and Memory: Memory is impaired.    Labs reviewed: Recent Labs    05/11/21 0000  NA 142  K 3.9  CL 109*  CO2 27*  BUN 12  CREATININE 0.6  CALCIUM 9.2   Recent Labs    05/11/21 0000  AST 14  ALT 11  ALKPHOS 72  ALBUMIN 3.9   Recent Labs    05/11/21 0000  WBC 5.0  NEUTROABS 2,425.00  HGB 12.7  HCT 39  PLT 327   Lab Results  Component Value Date   TSH 2.71 05/11/2021   Lab Results  Component Value Date   HGBA1C 5.4 08/14/2018   No results found for: CHOL, HDL, LDLCALC, LDLDIRECT, TRIG, CHOLHDL  Significant Diagnostic Results in last 30 days:  No results found.  Assessment/Plan 1. Anxiety due to dementia [facility]) - ongoing, behaviors beginning to occur earlier in the day - she becomes very upset when she does not understand where she is  - she has tried ativan with come success -  start sertraline 25 mg po daily  2. Agitation due to dementia [facility]) - behaviors beginning earlier in the day - will trial ativan 0.5 mg qhs x 2 weeks    Family/ staff Communication: plan discussed with husband and nurse  Labs/tests ordered:  none

## 2021-07-10 NOTE — Telephone Encounter (Signed)
I have spoken with husband, Eddie Dibbles, about symptoms. I plan to see her today.

## 2021-07-11 DIAGNOSIS — Z9181 History of falling: Secondary | ICD-10-CM | POA: Diagnosis not present

## 2021-07-11 DIAGNOSIS — R2681 Unsteadiness on feet: Secondary | ICD-10-CM | POA: Diagnosis not present

## 2021-07-11 DIAGNOSIS — R4789 Other speech disturbances: Secondary | ICD-10-CM | POA: Diagnosis not present

## 2021-07-11 DIAGNOSIS — G309 Alzheimer's disease, unspecified: Secondary | ICD-10-CM | POA: Diagnosis not present

## 2021-07-11 DIAGNOSIS — G3184 Mild cognitive impairment, so stated: Secondary | ICD-10-CM | POA: Diagnosis not present

## 2021-07-11 DIAGNOSIS — F0280 Dementia in other diseases classified elsewhere without behavioral disturbance: Secondary | ICD-10-CM | POA: Diagnosis not present

## 2021-07-11 DIAGNOSIS — M6281 Muscle weakness (generalized): Secondary | ICD-10-CM | POA: Diagnosis not present

## 2021-07-12 DIAGNOSIS — G3184 Mild cognitive impairment, so stated: Secondary | ICD-10-CM | POA: Diagnosis not present

## 2021-07-12 DIAGNOSIS — Z9181 History of falling: Secondary | ICD-10-CM | POA: Diagnosis not present

## 2021-07-12 DIAGNOSIS — R2681 Unsteadiness on feet: Secondary | ICD-10-CM | POA: Diagnosis not present

## 2021-07-12 DIAGNOSIS — M6281 Muscle weakness (generalized): Secondary | ICD-10-CM | POA: Diagnosis not present

## 2021-07-12 DIAGNOSIS — G309 Alzheimer's disease, unspecified: Secondary | ICD-10-CM | POA: Diagnosis not present

## 2021-07-12 DIAGNOSIS — F0280 Dementia in other diseases classified elsewhere without behavioral disturbance: Secondary | ICD-10-CM | POA: Diagnosis not present

## 2021-07-12 DIAGNOSIS — R4789 Other speech disturbances: Secondary | ICD-10-CM | POA: Diagnosis not present

## 2021-07-13 DIAGNOSIS — G309 Alzheimer's disease, unspecified: Secondary | ICD-10-CM | POA: Diagnosis not present

## 2021-07-13 DIAGNOSIS — R4789 Other speech disturbances: Secondary | ICD-10-CM | POA: Diagnosis not present

## 2021-07-13 DIAGNOSIS — F0280 Dementia in other diseases classified elsewhere without behavioral disturbance: Secondary | ICD-10-CM | POA: Diagnosis not present

## 2021-07-13 DIAGNOSIS — Z9181 History of falling: Secondary | ICD-10-CM | POA: Diagnosis not present

## 2021-07-13 DIAGNOSIS — R2681 Unsteadiness on feet: Secondary | ICD-10-CM | POA: Diagnosis not present

## 2021-07-13 DIAGNOSIS — G3184 Mild cognitive impairment, so stated: Secondary | ICD-10-CM | POA: Diagnosis not present

## 2021-07-13 DIAGNOSIS — M6281 Muscle weakness (generalized): Secondary | ICD-10-CM | POA: Diagnosis not present

## 2021-07-14 DIAGNOSIS — M6281 Muscle weakness (generalized): Secondary | ICD-10-CM | POA: Diagnosis not present

## 2021-07-14 DIAGNOSIS — F0280 Dementia in other diseases classified elsewhere without behavioral disturbance: Secondary | ICD-10-CM | POA: Diagnosis not present

## 2021-07-14 DIAGNOSIS — R4789 Other speech disturbances: Secondary | ICD-10-CM | POA: Diagnosis not present

## 2021-07-14 DIAGNOSIS — Z9181 History of falling: Secondary | ICD-10-CM | POA: Diagnosis not present

## 2021-07-14 DIAGNOSIS — R2681 Unsteadiness on feet: Secondary | ICD-10-CM | POA: Diagnosis not present

## 2021-07-14 DIAGNOSIS — G3184 Mild cognitive impairment, so stated: Secondary | ICD-10-CM | POA: Diagnosis not present

## 2021-07-14 DIAGNOSIS — G309 Alzheimer's disease, unspecified: Secondary | ICD-10-CM | POA: Diagnosis not present

## 2021-07-17 DIAGNOSIS — G309 Alzheimer's disease, unspecified: Secondary | ICD-10-CM | POA: Diagnosis not present

## 2021-07-17 DIAGNOSIS — R2681 Unsteadiness on feet: Secondary | ICD-10-CM | POA: Diagnosis not present

## 2021-07-17 DIAGNOSIS — F0280 Dementia in other diseases classified elsewhere without behavioral disturbance: Secondary | ICD-10-CM | POA: Diagnosis not present

## 2021-07-17 DIAGNOSIS — Z9181 History of falling: Secondary | ICD-10-CM | POA: Diagnosis not present

## 2021-07-17 DIAGNOSIS — R4789 Other speech disturbances: Secondary | ICD-10-CM | POA: Diagnosis not present

## 2021-07-17 DIAGNOSIS — G3184 Mild cognitive impairment, so stated: Secondary | ICD-10-CM | POA: Diagnosis not present

## 2021-07-17 DIAGNOSIS — M6281 Muscle weakness (generalized): Secondary | ICD-10-CM | POA: Diagnosis not present

## 2021-07-18 DIAGNOSIS — R4789 Other speech disturbances: Secondary | ICD-10-CM | POA: Diagnosis not present

## 2021-07-18 DIAGNOSIS — M6281 Muscle weakness (generalized): Secondary | ICD-10-CM | POA: Diagnosis not present

## 2021-07-18 DIAGNOSIS — F0280 Dementia in other diseases classified elsewhere without behavioral disturbance: Secondary | ICD-10-CM | POA: Diagnosis not present

## 2021-07-18 DIAGNOSIS — R2681 Unsteadiness on feet: Secondary | ICD-10-CM | POA: Diagnosis not present

## 2021-07-18 DIAGNOSIS — Z9181 History of falling: Secondary | ICD-10-CM | POA: Diagnosis not present

## 2021-07-18 DIAGNOSIS — G309 Alzheimer's disease, unspecified: Secondary | ICD-10-CM | POA: Diagnosis not present

## 2021-07-18 DIAGNOSIS — G3184 Mild cognitive impairment, so stated: Secondary | ICD-10-CM | POA: Diagnosis not present

## 2021-07-19 DIAGNOSIS — K648 Other hemorrhoids: Secondary | ICD-10-CM | POA: Diagnosis not present

## 2021-07-20 DIAGNOSIS — G309 Alzheimer's disease, unspecified: Secondary | ICD-10-CM | POA: Diagnosis not present

## 2021-07-20 DIAGNOSIS — R2681 Unsteadiness on feet: Secondary | ICD-10-CM | POA: Diagnosis not present

## 2021-07-20 DIAGNOSIS — M6281 Muscle weakness (generalized): Secondary | ICD-10-CM | POA: Diagnosis not present

## 2021-07-20 DIAGNOSIS — Z9181 History of falling: Secondary | ICD-10-CM | POA: Diagnosis not present

## 2021-07-20 DIAGNOSIS — F0280 Dementia in other diseases classified elsewhere without behavioral disturbance: Secondary | ICD-10-CM | POA: Diagnosis not present

## 2021-07-20 DIAGNOSIS — G3184 Mild cognitive impairment, so stated: Secondary | ICD-10-CM | POA: Diagnosis not present

## 2021-07-20 DIAGNOSIS — R4789 Other speech disturbances: Secondary | ICD-10-CM | POA: Diagnosis not present

## 2021-07-21 DIAGNOSIS — G3184 Mild cognitive impairment, so stated: Secondary | ICD-10-CM | POA: Diagnosis not present

## 2021-07-21 DIAGNOSIS — M6281 Muscle weakness (generalized): Secondary | ICD-10-CM | POA: Diagnosis not present

## 2021-07-21 DIAGNOSIS — F0280 Dementia in other diseases classified elsewhere without behavioral disturbance: Secondary | ICD-10-CM | POA: Diagnosis not present

## 2021-07-21 DIAGNOSIS — L602 Onychogryphosis: Secondary | ICD-10-CM | POA: Diagnosis not present

## 2021-07-21 DIAGNOSIS — Q6689 Other  specified congenital deformities of feet: Secondary | ICD-10-CM | POA: Diagnosis not present

## 2021-07-21 DIAGNOSIS — G309 Alzheimer's disease, unspecified: Secondary | ICD-10-CM | POA: Diagnosis not present

## 2021-07-21 DIAGNOSIS — R2681 Unsteadiness on feet: Secondary | ICD-10-CM | POA: Diagnosis not present

## 2021-07-21 DIAGNOSIS — Z9181 History of falling: Secondary | ICD-10-CM | POA: Diagnosis not present

## 2021-07-21 DIAGNOSIS — R4789 Other speech disturbances: Secondary | ICD-10-CM | POA: Diagnosis not present

## 2021-07-26 ENCOUNTER — Non-Acute Institutional Stay (INDEPENDENT_AMBULATORY_CARE_PROVIDER_SITE_OTHER): Payer: Medicare HMO | Admitting: Orthopedic Surgery

## 2021-07-26 ENCOUNTER — Encounter: Payer: Self-pay | Admitting: Orthopedic Surgery

## 2021-07-26 DIAGNOSIS — Z0000 Encounter for general adult medical examination without abnormal findings: Secondary | ICD-10-CM

## 2021-07-26 NOTE — Progress Notes (Signed)
Subjective:   LAVETTE HAFEN is a 86 y.o. female who presents for Medicare Annual (Subsequent) preventive examination.  Review of Systems     Cardiac Risk Factors include: advanced age (>56mn, >>48women);hypertension     Objective:    Today's Vitals   07/26/21 1100  BP: 134/90  Pulse: 66  Resp: 18  Temp: 97.6 F (36.4 C)  SpO2: 95%  Height: '5\' 5"'$  (1.651 m)   Body mass index is 19.89 kg/m.  Advanced Directives 07/26/2021 07/10/2021 06/15/2021 06/05/2021 05/10/2021 05/23/2016 01/17/2016  Does Patient Have a Medical Advance Directive? Yes Yes Yes Yes Yes No No  Type of AParamedicof [place]ione[place]Living will [place] will;Out of facility DNR (pink MOST or yellow form) Living will;Healthcare Power of ASpring[place] of facility DNR (pink MOST or yellow form) Living will;Healthcare Power of [place] of facility DNR (pink MOST or yellow form) H[place]Living will;Out of facility DNR (pink MOST or yellow form) - -  Does patient want to make changes to medical advance directive? No - Patient declined No - Patient declined No - Patient declined No - Patient declined No - Patient declined - -  Copy of [place] Chart? Yes - validated most recent copy scanned in chart (See row information) Yes - validated most recent copy scanned in chart (See row information) Yes - validated most recent copy scanned in chart (See row information) Yes - validated most recent copy scanned in chart (See row information) Yes - validated most recent copy scanned in chart (See row information) - -  Would patient like information on creating a medical advance directive? - - - - - No - patient declined information No - patient declined information  Pre-existing out of facility DNR order (yellow form or pink MOST form) - Pink MOST form placed in chart (order not valid for inpatient use) Pink MOST form placed in chart (order not valid  for inpatient use) Pink MOST form placed in chart (order not valid for inpatient use) Pink MOST form placed in chart (order not valid for inpatient use) - -    Current Medications (verified) Outpatient Encounter Medications as of 07/26/2021  Medication Sig   atorvastatin (LIPITOR) 10 MG tablet Take 10 mg by mouth every other day.   atorvastatin (LIPITOR) 20 MG tablet Take 20 mg by mouth every other day. Alternate 10 mg (1/2 tablet) with 20 mg (1 tablet ) every other day   cyanocobalamin 1000 MCG tablet Take 1,000 mcg by mouth daily.   divalproex (DEPAKOTE) 125 MG DR tablet Take 125 mg by mouth 2 (two) times daily.   gabapentin (NEURONTIN) 100 MG capsule Take 100 mg by mouth 2 (two) times daily.   LORazepam (ATIVAN) 0.5 MG tablet Take 0.5 mg by mouth as needed for anxiety.   ondansetron (ZOFRAN-ODT) 4 MG disintegrating tablet Take 4 mg by mouth every 4 (four) hours as needed for nausea or vomiting.   sertraline (ZOLOFT) 25 MG tablet Take 25 mg by mouth every morning.   Wheat Dextrin (BENEFIBER PO) Take 15 mLs by mouth 2 (two) times daily.   No facility-administered encounter medications on file as of 07/26/2021.    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 Lyndon Code, MD DOB/Age [birth year] (Age: 25) Gender: F Date Taken: 07/28/2009 Date Received: 07/28/2009  FINAL DIAGNOSIS  MICROSCOPIC EXAMINATION AND DIAGNOSIS  UTERUS, OVARIES AND FALLOPIAN TUBES, HYSTERECTOMY AND BILATERAL SALPINGO-OOPHORECTOMY: - CARCINOSARCOMA, ARISING FROM THE LOWER UTERINE SEGMENT, SPANNING 4.5 CM. - CARCINOSARCOMA IS 0.1 CM TO THE DEEP SOFT TISSUE RESECTION MARGIN. - CERVIX:  ATROPHY AND CHRONIC INFLAMMATION. - ENDOMETRIUM: ATROPHIC APPEARING ENDOMETRIUM. - MYOMETRIUM: LEIOMYOMATA. - SEROSA: ESSENTIALLY UNREMARKABLE. - ADNEXA: PARATUBAL CYST  COMMENT Immunohistochemical stains were performed on the malignant neoplasm with the following results. The controls stained appropriately.  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)    Seasonal allergies    Shoulder joint pain    Uterine cancer ([place])    Past Surgical History:  Procedure Laterality Date   ABDOMINAL HYSTERECTOMY  2010   Dr Christophe Louis   ANKLE SURGERY Left    HERNIA REPAIR     as a child   MASTECTOMY PARTIAL / LUMPECTOMY Left 1990   Breast sarcoma   Family History  Problem Relation Age of Onset   Cancer Father        pancreatic    Other Mother        "natural causes"   Social History   Socioeconomic History   Marital status: Married    Spouse name: Eddie Dibbles    Number of children: 1   Years of education: HS    Highest education level: Not on file  Occupational History   Occupation: retired   Tobacco Use   Smoking status: Never   Smokeless tobacco: Never  Vaping Use   Vaping Use: Never used  Substance and Sexual Activity   Alcohol use: No   Drug use: No   Sexual activity: Never    Comment: married  Other Topics Concern   Not on file  Social History Narrative   Patient lives at home with home [place] her husband.    Patient has 1 child.    Patient is retired.    Patient has a high school education.    Patient is right handed.   2-3 cups coffee per day.   Social Determinants of Health    Financial Resource Strain: Low Risk    Difficulty of Paying Living Expenses: Not hard at all  Food Insecurity: No Food Insecurity   Worried About Charity fundraiser in the Last Year: Never true   [place] in the Last Year: Never true  Transportation Needs: No Transportation Needs   Lack of Transportation (Medical): No   Lack of Transportation (Non-Medical): No  Physical Activity: Sufficiently Active   Days of Exercise per Week: 7 days   Minutes of Exercise per Session: 30 min  Stress: No Stress Concern Present   Feeling of Stress : Only a little  Social Connections: Moderately Isolated   Frequency of Communication with Friends and Family: More than three times a week  Frequency of Social Gatherings with Friends and Family: More than three times a week   Attends Religious Services: Never   Marine scientist or Organizations: No   Attends Music therapist: Never   Marital Status: Married    Tobacco Counseling Counseling given: Not Answered   Clinical Intake:  Pre-visit preparation completed: Yes  Pain : No/denies pain     BMI - recorded: 19.8 Nutritional Status: BMI of 19-24  Normal Nutritional Risks: None Diabetes: No  How often do you need to have someone help you when you read instructions, pamphlets, or other written materials from your doctor or pharmacy?: 5 - Always What is the last grade level you completed in school?: High school  Diabetic?No  Interpreter Needed?: No      Activities of Daily Living In your present state of health, do you have any difficulty performing the following activities: 07/26/2021  Hearing? N  Vision? N  Difficulty concentrating or making decisions? Y  Comment Alzheimers dementia  Walking or climbing stairs? N  Dressing or bathing? N  Doing errands, shopping? Y  Preparing Food and eating ? Y  Using the Toilet? N  In the past six months, have you accidently leaked urine? N  Do you have problems  with loss of bowel control? N  Managing your Medications? Y  Managing your Finances? Y  Housekeeping or managing your Housekeeping? Y  Some recent data might be hidden    Patient Care Team: Yvonna Alanis, NP as PCP - General (Adult Health Nurse Practitioner) Christophe Louis, MD as Consulting Physician (Obstetrics and Gynecology) Dennie Bible, NP as Nurse Practitioner (Neurology) Polly Cobia, [place] Bailiff, MD as Consulting Physician (Gynecologic Oncology)  Indicate any recent Medical Services you may have received from other than [facility] providers in the past year (date may be approximate).     Assessment:   This is a routine wellness examination for [facility].  Hearing/Vision screen No results found.  Dietary issues and exercise activities discussed: Current Exercise Habits: Home exercise routine [facility] with husband daily), Type of exercise: walking, Time (Minutes): 30, Frequency (Times/Week): 7, Weekly Exercise (Minutes/Week): 210, Intensity: Moderate, Exercise limited by: None identified   Goals Addressed             This Visit's Progress    DIET - INCREASE WATER INTAKE   On track      Depression Screen PHQ 2/9 Scores 07/26/2021  Exception Documentation Patient refusal    Fall Risk Fall Risk  07/26/2021  Falls in the past year? 1  Number falls in past yr: 0  Injury with Fall? 0  Risk for fall due to : Other (Comment)  Risk for fall due to: Comment Alzheimers dementia  Follow up Falls evaluation completed;Education provided;Falls prevention discussed    FALL RISK PREVENTION PERTAINING TO THE HOME:  Any stairs in or around the home? No  If so, are there any without handrails? No  Home free of loose throw rugs in walkways, pet beds, electrical cords, etc? Yes  Adequate lighting in your home to reduce risk of falls? Yes   ASSISTIVE DEVICES UTILIZED TO PREVENT FALLS:  Life alert? No  Use of a cane, walker or w/c? No  Grab bars in the bathroom? Yes  Shower chair or  bench in shower? Yes  Elevated toilet seat or a handicapped toilet? Yes   TIMED UP AND GO:  Was the test performed? No .  Length of time to ambulate 10 feet: N/A  sec.   Gait steady and fast without use of assistive device  Cognitive Function: MMSE - Mini Mental State Exam 07/26/2021 08/14/2018 01/10/2015 10/11/2014  Not completed: Refused - - -  Orientation to time - '2 5 5  '$ Orientation to Place - '3 5 5  '$ Registration - '3 3 3  '$ Attention/ Calculation - '5 5 5  '$ Recall - 0 2 3  Language- name 2 objects - '2 2 2  '$ Language- repeat - '1 1 1  '$ Language- follow 3 step command - '3 3 2  '$ Language- read & follow direction - '1 1 1  '$ Write a sentence - '1 1 1  '$ Copy design - '1 1 1  '$ Total score - '22 29 29     '$ 6CIT Screen 07/26/2021  What Year? 4 points  What month? 3 points  What time? 3 points  Count back from 20 2 points  Months in reverse 2 points  Repeat phrase 6 points  Total Score 20    Immunizations Immunization History  Administered Date(s) Administered   Influenza, High Dose Seasonal PF 07/26/2017   Influenza-Unspecified 08/06/2018, 07/08/2019, 07/22/2020   PFIZER(Purple Top)SARS-COV-2 Vaccination 12/25/2019, 01/17/2020, 08/14/2020, 03/24/2021   Pneumococcal Conjugate-13 10/01/2014   Pneumococcal Polysaccharide-23 07/02/2004   Td 07/02/2008   Tdap 09/02/2018   Zoster Recombinat (Shingrix) 10/17/2020, 01/17/2021   Zoster, Live 07/02/2008    TDAP status: Up to date  Flu Vaccine status: Due, Education has been provided regarding the importance of this vaccine. Advised may receive this vaccine at local pharmacy or Health Dept. Aware to provide a copy of the vaccination record if obtained from local pharmacy or Health Dept. Verbalized acceptance and understanding.  Pneumococcal vaccine status: Up to date  Covid-19 vaccine status: Completed vaccines  Qualifies for Shingles Vaccine? Yes   Zostavax completed Yes   Shingrix Completed?: Yes  Screening Tests Health Maintenance   Topic Date Due   DEXA SCAN  Never done   INFLUENZA VACCINE  06/12/2021   COVID-19 Vaccine (5 - Booster for Pfizer series) 07/25/2021   TETANUS/TDAP  09/02/2028   PNA vac Low Risk Adult  Completed   Zoster Vaccines- Shingrix  Completed   HPV VACCINES  Aged Out    Health Maintenance  Health Maintenance Due  Topic Date Due   DEXA SCAN  Never done   INFLUENZA VACCINE  06/12/2021   COVID-19 Vaccine (5 - Booster for [place] series) 07/25/2021    Colorectal cancer screening: No longer required.   Mammogram status: No longer required due to advanced age.  Bone Density status: Completed unknown. Results reflect: Bone density results: NORMAL. Repeat every unknown results years.  Lung Cancer Screening: (Low Dose CT Chest recommended if Age 30-80 years, 30 pack-year currently smoking OR have quit w/in 15years.) does not qualify.   Lung Cancer Screening Referral: No  Additional Screening:  Hepatitis C Screening: does qualify; Completed unknown  Vision Screening: Recommended annual ophthalmology exams for early detection of glaucoma and other disorders of the eye. Is the patient up to date with their annual eye exam?  Yes  Who is the provider or what is the name of the office in which the patient attends annual eye exams? N/A If pt is not established with a provider, would they like to be referred to a provider to establish care? No .   Dental Screening: Recommended annual dental exams for proper oral hygiene  Community Resource Referral / Chronic Care Management: CRR required this visit?  No   CCM required  this visit?  No      Plan:     I have personally reviewed and noted the following in the patient's chart:   Medical and social history Use of alcohol, tobacco or illicit drugs  Current medications and supplements including opioid prescriptions.  Functional ability and status Nutritional status Physical activity Advanced directives List of other  physicians Hospitalizations, surgeries, and ER visits in previous 12 months Vitals Screenings to include cognitive, depression, and falls Referrals and appointments  In addition, I have reviewed and discussed with patient certain preventive protocols, quality metrics, and best practice recommendations. A written personalized care plan for preventive services as well as general preventive health recommendations were provided to patient.     Yvonna Alanis, NP   07/26/2021

## 2021-07-26 NOTE — Patient Instructions (Signed)
  Laura Perkins , Thank you for taking time to come for your Medicare Wellness Visit. I appreciate your ongoing commitment to your health goals. Please review the following plan we discussed and let me know if I can assist you in the future.   These are the goals we discussed:  Goals      DIET - INCREASE WATER INTAKE        This is a list of the screening recommended for you and due dates:  Health Maintenance  Topic Date Due   DEXA scan (bone density measurement)  Never done   Flu Shot  06/12/2021   COVID-19 Vaccine (5 - Booster for Pfizer series) 07/25/2021   Tetanus Vaccine  09/02/2028   Pneumonia vaccines  Completed   Zoster (Shingles) Vaccine  Completed   HPV Vaccine  Aged Out   Flu vaccine and latest covid booster to be offered to residents within the next month. Unknown when last bone density test was done. Recommend having one in near future to determine bone health.

## 2021-07-26 NOTE — Progress Notes (Signed)
Location:   [place] Room Number: 32-A Place of Service:  ALF [phone]) Provider:  Windell Moulding, NP    Patient Care Team: Yvonna Alanis, NP as PCP - General (Adult Health Nurse Practitioner) Christophe Louis, MD as Consulting Physician (Obstetrics and Gynecology) Dennie Bible, NP as Nurse Practitioner (Neurology) Polly Cobia, [place] Bailiff, MD as Consulting Physician (Gynecologic Oncology)  Extended Emergency Contact Information Primary Emergency Contact: Ashby,Paul Address: [address]          [place], [place] [address] Johnnette Litter of [place] Phone: [phone] Mobile Phone: [phone] Relation: Spouse Secondary Emergency Contact: O'Keeffe,Joanne Mobile Phone: [phone] Relation: Daughter  Code Status:  FULL CODE Goals of care: Advanced Directive information Advanced Directives 07/26/2021  Does Patient Have a Medical Advance Directive? Yes  Type of Paramedic of [place];Living will  Does patient want to make changes to medical advance directive? No - Patient declined  Copy of [place] in Chart? Yes - validated most recent copy scanned in chart (See row information)  Would patient like information on creating a medical advance directive? -  Pre-existing out of facility DNR order (yellow form or pink MOST form) -     Chief Complaint  Patient presents with   Medical Management of Chronic Issues    Routine Visit.   Health Maintenance    Discuss the need for Dexa Scan.   Immunizations    Discuss the need for Influenza vaccine, and 3rd Covid Booster.    HPI:  Pt is a 86 y.o. female seen today for medical management of chronic diseases.     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 Lyndon Code, MD DOB/Age [birth year] (Age: 89) Gender: F Date Taken: 07/28/2009 Date Received: 07/28/2009  FINAL DIAGNOSIS  MICROSCOPIC EXAMINATION AND DIAGNOSIS  UTERUS, OVARIES AND FALLOPIAN TUBES, HYSTERECTOMY AND BILATERAL SALPINGO-OOPHORECTOMY: - CARCINOSARCOMA, ARISING FROM THE LOWER UTERINE SEGMENT, SPANNING 4.5 CM. - CARCINOSARCOMA IS 0.1 CM TO THE DEEP SOFT TISSUE RESECTION MARGIN. - CERVIX: ATROPHY AND CHRONIC INFLAMMATION. - ENDOMETRIUM: ATROPHIC APPEARING ENDOMETRIUM. - MYOMETRIUM: LEIOMYOMATA. - SEROSA: ESSENTIALLY UNREMARKABLE. - ADNEXA: PARATUBAL CYST  COMMENT Immunohistochemical stains were performed on the malignant neoplasm with the following results. The controls stained appropriately.  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 If you have any questions, ask your nurse or doctor.          atorvastatin 10 MG tablet Commonly known as: LIPITOR Take 10 mg by mouth every other day.    atorvastatin 20 MG tablet Commonly known as: LIPITOR Take 20 mg by mouth every other day. Alternate 10 mg (1/2 tablet) with 20 mg (1 tablet ) every other day   BENEFIBER PO Take 15 mLs by mouth 2 (two) times daily.   cyanocobalamin 1000 MCG tablet Take 1,000 mcg by mouth daily.   divalproex 125 MG DR tablet Commonly known as: DEPAKOTE Take 125 mg by mouth 2 (two) times daily.   gabapentin 100 MG capsule Commonly known as: NEURONTIN Take 100 mg by mouth 2 (two) times daily.   LORazepam 0.5 MG tablet Commonly known as: ATIVAN Take 0.5 mg by mouth as needed for anxiety.   ondansetron 4 MG disintegrating tablet Commonly known as: ZOFRAN-ODT Take 4 mg by mouth every 4 (four) hours as needed for nausea or vomiting.   sertraline 25 MG tablet Commonly known as: ZOLOFT Take 25 mg by mouth every morning.        Review of Systems  Immunization History  Administered Date(s) Administered   Influenza, High Dose Seasonal PF 07/26/2017   Influenza-Unspecified 08/06/2018, 07/08/2019, 07/22/2020   PFIZER(Purple Top)SARS-COV-2 Vaccination 12/25/2019, 01/17/2020, 08/14/2020, 03/24/2021   Pneumococcal Conjugate-13 10/01/2014   Pneumococcal Polysaccharide-23 07/02/2004   Td 07/02/2008   Tdap 09/02/2018   Zoster Recombinat (Shingrix) 10/17/2020, 01/17/2021   Zoster, Live 07/02/2008   Pertinent  Health Maintenance Due  Topic Date Due   DEXA SCAN  Never done   INFLUENZA VACCINE  06/12/2021   PNA vac Low Risk Adult  Completed   No flowsheet data found. Functional Status Survey:    Vitals:   07/26/21 1100  BP: 134/90  Pulse: 66  Resp: 18  Temp: 97.6 F (36.4 C)  SpO2: 95%  Height: '5\' 5"'$  (1.651 m)   Body mass index is 19.89 kg/m. Physical Exam  Labs reviewed: Recent Labs    05/11/21 0000  NA 142  K 3.9  CL 109*  CO2 27*  BUN 12  CREATININE 0.6  CALCIUM 9.2   Recent Labs    05/11/21 0000  AST 14  ALT 11  ALKPHOS 72  ALBUMIN 3.9   Recent Labs     05/11/21 0000  WBC 5.0  NEUTROABS 2,425.00  HGB 12.7  HCT 39  PLT 327   Lab Results  Component Value Date   TSH 2.71 05/11/2021   Lab Results  Component Value Date   HGBA1C 5.4 08/14/2018   No results found for: CHOL, HDL, LDLCALC, LDLDIRECT, TRIG, CHOLHDL  Significant Diagnostic Results in last 30 days:  No results found.  Assessment/Plan There are no diagnoses linked to this encounter.   Family/ staff Communication:   Labs/tests ordered:

## 2021-07-27 DIAGNOSIS — F419 Anxiety disorder, unspecified: Secondary | ICD-10-CM | POA: Diagnosis not present

## 2021-07-27 DIAGNOSIS — G309 Alzheimer's disease, unspecified: Secondary | ICD-10-CM | POA: Diagnosis not present

## 2021-08-14 DIAGNOSIS — Z13220 Encounter for screening for lipoid disorders: Secondary | ICD-10-CM | POA: Diagnosis not present

## 2021-08-14 DIAGNOSIS — E785 Hyperlipidemia, unspecified: Secondary | ICD-10-CM | POA: Diagnosis not present

## 2021-08-14 DIAGNOSIS — E538 Deficiency of other specified B group vitamins: Secondary | ICD-10-CM | POA: Diagnosis not present

## 2021-08-14 DIAGNOSIS — D519 Vitamin B12 deficiency anemia, unspecified: Secondary | ICD-10-CM | POA: Diagnosis not present

## 2021-08-14 LAB — LIPID PANEL
Cholesterol: 121 (ref 0–200)
HDL: 36 (ref 35–70)
LDL Cholesterol: 66
Triglycerides: 105 (ref 40–160)

## 2021-08-14 LAB — VITAMIN B12: Vitamin B-12: 686

## 2021-08-21 ENCOUNTER — Non-Acute Institutional Stay: Payer: Medicare HMO | Admitting: Orthopedic Surgery

## 2021-08-21 DIAGNOSIS — M542 Cervicalgia: Secondary | ICD-10-CM | POA: Diagnosis not present

## 2021-08-22 ENCOUNTER — Encounter: Payer: Self-pay | Admitting: Orthopedic Surgery

## 2021-08-22 NOTE — Progress Notes (Signed)
Location:  [place] Room Number: 32-A Place of Service:  ALF [phone]) Provider:  Windell Moulding, [place], [place], NP  Patient Care Team: Yvonna Alanis, NP as PCP - General (Adult Health Nurse Practitioner) Christophe Louis, MD as Consulting Physician (Obstetrics and Gynecology) Dennie Bible, NP as Nurse Practitioner (Neurology) Polly Cobia, [place] Bailiff, MD as Consulting Physician (Gynecologic Oncology)  Extended Emergency Contact Information Primary Emergency Contact: Ashby,Paul Address: [address]          [place], [place] [address] Johnnette Litter of [place] Phone: [phone] Mobile Phone: [phone] Relation: Spouse Secondary Emergency Contact: O'Keeffe,Joanne Mobile Phone: [phone] Relation: Daughter  Code Status:  DNR Goals of care: Advanced Directive information Advanced Directives 07/26/2021  Does Patient Have a Medical Advance Directive? Yes  Type of Paramedic of [place];Living will  Does patient want to make changes to medical advance directive? No - Patient declined  Copy of [place] in Chart? Yes - validated most recent copy scanned in chart (See row information)  Would patient like information on creating a medical advance directive? -  Pre-existing out of facility DNR order (yellow form or pink MOST form) -     Chief Complaint  Patient presents with   Acute Visit    Neck pain    HPI:  Pt is a 86 y.o. female seen today for acute visit due to pain.   Husband present during encounter.   Nursing staff reports she woke up this morning and c/o neck pain. She is a poor historian due to advanced dementia. She is able to confirm pain in her neck but cannot describe pain. States" left side hurts more."   Past Medical History:  Diagnosis Date   Abdominal adhesions    Alzheimer's disease with memory loss 06/25/2013   Back ache    Breast cancer ([place])    Carcinosarcoma of uterus s/p TAH  BSO 2010 11/22/2012   REPORT OF SURGICAL PATHOLOGY  Case #: [id number] Patient Name: CENDY, OCONNOR Office Chart Number: N/A  MRN: [id number] Pathologist: Vonna Kotyk B. Lyndon Code, MD DOB/Age Feb 20, 1935 (Age: 42) Gender: F Date Taken: 07/28/2009 Date Received: 07/28/2009  FINAL DIAGNOSIS  MICROSCOPIC EXAMINATION AND DIAGNOSIS  UTERUS, OVARIES AND FALLOPIAN TUBES, HYSTERECTOMY AND BILATERAL SALPINGO-OOPHORECTOMY: - CARCINOSARCOMA, ARISING FROM THE LOWER UTERINE SEGMENT, SPANNING 4.5 CM. - CARCINOSARCOMA IS 0.1 CM TO THE DEEP SOFT TISSUE RESECTION MARGIN. - CERVIX: ATROPHY AND CHRONIC INFLAMMATION. - ENDOMETRIUM: ATROPHIC APPEARING ENDOMETRIUM. - MYOMETRIUM: LEIOMYOMATA. - SEROSA: ESSENTIALLY UNREMARKABLE. - ADNEXA: PARATUBAL CYST  COMMENT Immunohistochemical stains were performed on the malignant neoplasm with the following results. The controls stained appropriately.  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   atorvastatin (LIPITOR) 20 MG tablet Take 20 mg by mouth every other day. Alternate 10 mg (1/2 tablet)  with 20 mg (1 tablet ) every other day   cyanocobalamin 1000 MCG tablet Take 1,000 mcg by mouth daily.   divalproex (DEPAKOTE) 125 MG DR tablet Take 125 mg by mouth 2 (two) times daily.   gabapentin (NEURONTIN) 100 MG capsule Take 100 mg by mouth 2 (two) times daily.   LORazepam (ATIVAN) 0.5 MG tablet Take 0.5 mg by mouth as needed for anxiety.   ondansetron (ZOFRAN-ODT) 4 MG disintegrating tablet Take 4 mg by mouth every 4 (four) hours as needed for nausea or vomiting.   sertraline (ZOLOFT) 25 MG tablet Take 25 mg by mouth every morning.   Wheat Dextrin (BENEFIBER PO) Take 15 mLs by mouth 2 (two) times daily.   No facility-administered encounter medications on file as of 08/21/2021.    Review of Systems  Unable to perform ROS: Dementia   Immunization History  Administered Date(s) Administered   Influenza, High Dose Seasonal PF 07/26/2017   Influenza-Unspecified 08/06/2018, 07/08/2019, 07/22/2020   PFIZER(Purple Top)SARS-COV-2 Vaccination 12/25/2019, 01/17/2020, 08/14/2020, 03/24/2021   Pneumococcal Conjugate-13 10/01/2014   Pneumococcal Polysaccharide-23 07/02/2004   Td 07/02/2008   Tdap 09/02/2018   Zoster Recombinat (Shingrix) 10/17/2020, 01/17/2021   Zoster, Live 07/02/2008   Pertinent  Health Maintenance Due  Topic Date Due   DEXA SCAN  Never done   INFLUENZA VACCINE  06/12/2021   Fall Risk  07/26/2021  Falls in the past year? 1  Number falls in past yr: 0  Injury with Fall? 0  Risk for fall due to : Other (Comment)  Risk for fall due to: Comment Alzheimers dementia  Follow up Falls evaluation completed;Education provided;Falls prevention discussed   Functional Status Survey:    Vitals:   08/22/21 1532  BP: 111/75  Pulse: 68  Resp: 16  Temp: (!) 97.1 F (36.2 C)  SpO2: 96%  Weight: 104 lb (47.2 kg)   Body mass index is 17.31 kg/m. Physical Exam Vitals reviewed.  Constitutional:      General: She is not in acute distress. HENT:     Head: Normocephalic.   Eyes:     General:        Right eye: No discharge.        Left eye: No discharge.  Neck:     Comments: Unable to move head from left to right for forward and backwards, pain with palpation over sternocleidomastoid-mastoid muscle on left neck.  Cardiovascular:     Rate and Rhythm: Normal rate and regular rhythm.     Pulses: Normal pulses.     Heart sounds: Normal heart sounds. No murmur heard. Pulmonary:     Effort: Pulmonary effort is normal. No respiratory distress.     Breath sounds: Normal breath sounds. No wheezing.  Musculoskeletal:     Cervical back: Rigidity present. No erythema or signs of trauma. Pain with movement and muscular tenderness present. Decreased range of motion.  Neurological:     General: No focal deficit present.     Mental Status: She is alert. Mental status is at baseline.     Motor: Weakness present.     Gait: Gait  abnormal.  Psychiatric:        Mood and Affect: Mood normal.        Behavior: Behavior normal.        Cognition and Memory: Memory is impaired.    Labs reviewed: Recent Labs    05/11/21 0000  NA 142  K 3.9  CL 109*  CO2 27*  BUN 12  CREATININE 0.6  CALCIUM 9.2   Recent Labs    05/11/21 0000  AST 14  ALT 11  ALKPHOS 72  ALBUMIN 3.9   Recent Labs    05/11/21 0000  WBC 5.0  NEUTROABS 2,425.00  HGB 12.7  HCT 39  PLT 327   Lab Results  Component Value Date   TSH 2.71 05/11/2021   Lab Results  Component Value Date   HGBA1C 5.4 08/14/2018   No results found for: CHOL, HDL, LDLCALC, LDLDIRECT, TRIG, CHOLHDL  Significant Diagnostic Results in last 30 days:  No results found.  Assessment/Plan 1. Cervicalgia - suspect due to sleeping awkward - limited neck ROM, tenderness over sternocleidomastoid muscles on palpation - ibuprofen 800 mg po bid x 2 days- please give with food - voltaren gel 1 % - apply to left neck and shoulder TID x 3 days, then TID prn - recommend warm shower and hot packs to area prn   Family/  staff Communication: plan discussed with patient, husband and nurse  Labs/tests ordered:  none

## 2021-08-24 DIAGNOSIS — G309 Alzheimer's disease, unspecified: Secondary | ICD-10-CM | POA: Diagnosis not present

## 2021-08-24 DIAGNOSIS — F419 Anxiety disorder, unspecified: Secondary | ICD-10-CM | POA: Diagnosis not present

## 2021-09-21 DIAGNOSIS — R440 Auditory hallucinations: Secondary | ICD-10-CM | POA: Diagnosis not present

## 2021-09-21 DIAGNOSIS — G309 Alzheimer's disease, unspecified: Secondary | ICD-10-CM | POA: Diagnosis not present

## 2021-09-21 DIAGNOSIS — F411 Generalized anxiety disorder: Secondary | ICD-10-CM | POA: Diagnosis not present

## 2021-09-21 DIAGNOSIS — R441 Visual hallucinations: Secondary | ICD-10-CM | POA: Diagnosis not present

## 2021-09-26 ENCOUNTER — Other Ambulatory Visit: Payer: Self-pay | Admitting: *Deleted

## 2021-09-26 MED ORDER — LORAZEPAM 0.5 MG PO TABS: 0.5000 mg | ORAL_TABLET | Freq: Every evening | ORAL | 5 refills | Status: DC | PRN

## 2021-09-26 NOTE — Telephone Encounter (Signed)
Received fax from [facility] Requesting refill.  Pended Rx and sent to Amy for approval.

## 2021-10-20 DIAGNOSIS — F411 Generalized anxiety disorder: Secondary | ICD-10-CM | POA: Diagnosis not present

## 2021-10-20 DIAGNOSIS — R440 Auditory hallucinations: Secondary | ICD-10-CM | POA: Diagnosis not present

## 2021-10-20 DIAGNOSIS — G309 Alzheimer's disease, unspecified: Secondary | ICD-10-CM | POA: Diagnosis not present

## 2021-10-20 DIAGNOSIS — R441 Visual hallucinations: Secondary | ICD-10-CM | POA: Diagnosis not present

## 2021-11-23 ENCOUNTER — Encounter: Payer: Self-pay | Admitting: Internal Medicine

## 2021-11-23 ENCOUNTER — Non-Acute Institutional Stay: Payer: Medicare HMO | Admitting: Internal Medicine

## 2021-11-23 DIAGNOSIS — F0280 Dementia in other diseases classified elsewhere without behavioral disturbance: Secondary | ICD-10-CM

## 2021-11-23 DIAGNOSIS — G6289 Other specified polyneuropathies: Secondary | ICD-10-CM | POA: Diagnosis not present

## 2021-11-23 DIAGNOSIS — G309 Alzheimer's disease, unspecified: Secondary | ICD-10-CM

## 2021-11-23 DIAGNOSIS — E7800 Pure hypercholesterolemia, unspecified: Secondary | ICD-10-CM | POA: Diagnosis not present

## 2021-11-23 DIAGNOSIS — F03911 Unspecified dementia, unspecified severity, with agitation: Secondary | ICD-10-CM

## 2021-11-23 NOTE — Progress Notes (Signed)
Location:   [place] Room Number: 32 Place of Service:  ALF [phone]) Provider:  Veleta Miners MD  Yvonna Alanis, NP  Patient Care Team: Yvonna Alanis, NP as PCP - General (Adult Health Nurse Practitioner) Christophe Louis, MD as Consulting Physician (Obstetrics and Gynecology) Dennie Bible, NP as Nurse Practitioner (Neurology) Polly Cobia, [place] Bailiff, MD as Consulting Physician (Gynecologic Oncology)  Extended Emergency Contact Information Primary Emergency Contact: Ashby,Paul Address: [address]          [place], [place] [address] Johnnette Litter of [place] Phone: [phone] Mobile Phone: [phone] Relation: Spouse Secondary Emergency Contact: O'Keeffe,Joanne Mobile Phone: [phone] Relation: Daughter  Code Status:  Managed Care Goals of care: Advanced Directive information Advanced Directives 07/26/2021  Does Patient Have a Medical Advance Directive? Yes  Type of Paramedic of [place];Living will  Does patient want to make changes to medical advance directive? No - Patient declined  Copy of [place] in Chart? Yes - validated most recent copy scanned in chart (See row information)  Would patient like information on creating a medical advance directive? -  Pre-existing out of facility DNR order (yellow form or pink MOST form) -     Chief Complaint  Patient presents with   Medical Management of Chronic Issues   Quality Metric Gaps    Dexa     HPI:  Pt is a 87 y.o. female seen today for medical management of chronic diseases.     Patient has a history of breast cancer DCIS,  stage II uterine carcinosarcoma diagnosed at the time of vaginal hysterectomy,s/p chemo and radiation therapy, peripheral neuropathy, hypertension, HLD   History of progressive dementia most likely Alzheimer's has not tolerated Aricept and Namenda per neurology note. History of B12 deficiency Patient also has a history of  episodic recurrent partial SBO/enteritis Possibly due to abdominal adhesions  Lives in [place]  Husband very supportive She is stable. No new Nursing issues. 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 Lyndon Code, MD DOB/Age June 26, 1935 (Age: 44) Gender: F Date Taken: 07/28/2009 Date Received: 07/28/2009  FINAL DIAGNOSIS  MICROSCOPIC EXAMINATION AND DIAGNOSIS  UTERUS, OVARIES AND FALLOPIAN TUBES, HYSTERECTOMY AND BILATERAL SALPINGO-OOPHORECTOMY: - CARCINOSARCOMA, ARISING FROM THE LOWER UTERINE SEGMENT, SPANNING 4.5 CM. - CARCINOSARCOMA IS 0.1 CM TO THE DEEP SOFT TISSUE RESECTION MARGIN. - CERVIX: ATROPHY AND CHRONIC INFLAMMATION. - ENDOMETRIUM: ATROPHIC APPEARING ENDOMETRIUM. - MYOMETRIUM: LEIOMYOMATA. - SEROSA: ESSENTIALLY UNREMARKABLE. - ADNEXA: PARATUBAL CYST  COMMENT Immunohistochemical stains were performed on the malignant neoplasm with the following results. The controls stained appropriately.  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/2014   High cholesterol    History of mitral valve prolapse    Hypertension    Hypothyroidism    Ileus (HCC)    Memory loss    Neuropathy    Overactive bladder    Rosacea    Sarcoma of  central portion of left female breast ([place])    Seasonal allergies    Shoulder joint pain    Uterine cancer [facility])    Past Surgical History:  Procedure Laterality Date   ABDOMINAL HYSTERECTOMY   2010   Dr Christophe Louis   ANKLE SURGERY Left    HERNIA REPAIR     as a child   MASTECTOMY PARTIAL / LUMPECTOMY Left 1990   Breast sarcoma    Allergies  Allergen Reactions   Sulfa Antibiotics Anaphylaxis    Allergies as of 11/23/2021       Reactions   Sulfa Antibiotics Anaphylaxis        Medication List        Accurate as of November 23, 2021 11:59 PM. If you have any questions, ask your nurse or doctor.          atorvastatin 10 MG tablet Commonly known as: LIPITOR Take 10 mg by mouth every other day.   atorvastatin 20 MG tablet Commonly known as: LIPITOR Take 20 mg by mouth every other day. Alternate 10 mg (1/2 tablet) with 20 mg (1 tablet ) every other day   BENEFIBER PO Take 15 mLs by mouth 2 (two) times daily.   cyanocobalamin 1000 MCG tablet Take 1,000 mcg by mouth daily.   diclofenac Sodium 1 % Gel Commonly known as: VOLTAREN Apply 1 g topically in the morning, at noon, and at bedtime.   divalproex 125 MG DR tablet Commonly known as: DEPAKOTE Take 125 mg by mouth 2 (two) times daily.   gabapentin 100 MG capsule Commonly known as: NEURONTIN Take 100 mg by mouth 2 (two) times daily.   LORazepam 0.5 MG tablet Commonly known as: ATIVAN Take 1 tablet (0.5 mg total) by mouth at bedtime as needed for anxiety.   ondansetron 4 MG disintegrating tablet Commonly known as: ZOFRAN-ODT Take 4 mg by mouth every 4 (four) hours as needed for nausea or vomiting.   sertraline 25 MG tablet Commonly known as: ZOLOFT Take 25 mg by mouth every morning.        Review of Systems  Constitutional:  Negative for activity change and appetite change.  HENT: Negative.    Respiratory:  Negative for cough and shortness of breath.   Cardiovascular:  Negative for leg swelling.  Gastrointestinal:  Negative for constipation.  Genitourinary: Negative.   Musculoskeletal:  Negative for arthralgias, gait problem and myalgias.  Skin: Negative.   Neurological:  Negative for  dizziness and weakness.  Psychiatric/Behavioral:  Positive for confusion. Negative for dysphoric mood and sleep disturbance.    Immunization History  Administered Date(s) Administered   Influenza, High Dose Seasonal PF 07/26/2017   Influenza-Unspecified 08/06/2018, 07/08/2019, 07/22/2020, 09/05/2021   PFIZER(Purple Top)SARS-COV-2 Vaccination 12/25/2019, 01/17/2020, 08/14/2020, 03/24/2021   Pfizer Covid-19 Vaccine Bivalent Booster 24yrs & up 08/02/2021   Pneumococcal Conjugate-13 10/01/2014   Pneumococcal Polysaccharide-23 07/02/2004   Td 07/02/2008   Tdap 09/02/2018   Zoster Recombinat (Shingrix) 10/17/2020, 01/17/2021   Zoster, Live 07/02/2008   Pertinent  Health Maintenance Due  Topic Date Due   DEXA SCAN  Never done   INFLUENZA VACCINE  Completed   Fall Risk 07/26/2021  Falls in the past year? 1  Was there an injury with Fall? 0  Fall Risk Category Calculator 1  Fall Risk Category Low  Patient Fall Risk Level Moderate fall risk  Patient at Risk for Falls Due to Other (Comment)  Patient at Risk for Falls Due to Alzheimers dementia  Fall risk  Follow up Falls evaluation completed;Education provided;Falls prevention discussed   Functional Status Survey:    Vitals:   11/23/21 1143  BP: (!) 132/53  Pulse: 61  Resp: 18  Temp: (!) 97.5 F (36.4 C)  SpO2: 95%  Weight: 99 lb 9.6 oz (45.2 kg)  Height: 5\' 5"  (1.651 m)   Body mass index is 16.57 kg/m. Physical Exam Vitals reviewed.  Constitutional:      Appearance: Normal appearance.  HENT:     Head: Normocephalic.     Nose: Nose normal.     Mouth/Throat:     Mouth: Mucous membranes are moist.     Pharynx: Oropharynx is clear.  Eyes:     Pupils: Pupils are equal, round, and reactive to light.  Cardiovascular:     Rate and Rhythm: Normal rate and regular rhythm.     Pulses: Normal pulses.     Heart sounds: Normal heart sounds. No murmur heard. Pulmonary:     Effort: Pulmonary effort is normal.     Breath sounds:  Normal breath sounds.  Abdominal:     General: Abdomen is flat. Bowel sounds are normal.     Palpations: Abdomen is soft.  Musculoskeletal:        General: No swelling.     Cervical back: Neck supple.  Skin:    General: Skin is warm.  Neurological:     General: No focal deficit present.     Mental Status: She is alert.     Comments: Walks well Does have mild Aphasia  Psychiatric:        Mood and Affect: Mood normal.        Thought Content: Thought content normal.    Labs reviewed: Recent Labs    05/11/21 0000  NA 142  K 3.9  CL 109*  CO2 27*  BUN 12  CREATININE 0.6  CALCIUM 9.2   Recent Labs    05/11/21 0000  AST 14  ALT 11  ALKPHOS 72  ALBUMIN 3.9   Recent Labs    05/11/21 0000  WBC 5.0  NEUTROABS 2,425.00  HGB 12.7  HCT 39  PLT 327   Lab Results  Component Value Date   TSH 2.71 05/11/2021   Lab Results  Component Value Date   HGBA1C 5.4 08/14/2018   No results found for: CHOL, HDL, LDLCALC, LDLDIRECT, TRIG, CHOLHDL  Significant Diagnostic Results in last 30 days:  No results found.  Assessment/Plan 1. Alzheimer's disease with memory loss Has not tolerated Aricept and Namenda Doing well with supportive care  2. Agitation due to dementia Doing well with Depakote Ativan Prn in the evening Labs done in 8/22 were all normal  3. Hypercholesterolemia LDL Less then 100 in 10/22  4. Other polyneuropathy Continue Neurontin 5 Depression Stable on Zoloft 6 Vit B12 def Continue Supplement  Uterine Cancer Post Chemo and radiation in 2011 Follows with Dr Polly Cobia No Recurent disease per her note CA 125 in normal levels Family/ staff Communication:   Labs/tests ordered:

## 2021-11-25 ENCOUNTER — Encounter: Payer: Self-pay | Admitting: Internal Medicine

## 2021-12-19 ENCOUNTER — Encounter: Payer: Self-pay | Admitting: Nurse Practitioner

## 2021-12-19 ENCOUNTER — Non-Acute Institutional Stay: Payer: Medicare HMO | Admitting: Nurse Practitioner

## 2021-12-19 DIAGNOSIS — E538 Deficiency of other specified B group vitamins: Secondary | ICD-10-CM

## 2021-12-19 DIAGNOSIS — R634 Abnormal weight loss: Secondary | ICD-10-CM | POA: Diagnosis not present

## 2021-12-19 DIAGNOSIS — I10 Essential (primary) hypertension: Secondary | ICD-10-CM

## 2021-12-19 DIAGNOSIS — G309 Alzheimer's disease, unspecified: Secondary | ICD-10-CM

## 2021-12-19 DIAGNOSIS — E7800 Pure hypercholesterolemia, unspecified: Secondary | ICD-10-CM

## 2021-12-19 DIAGNOSIS — F0394 Unspecified dementia, unspecified severity, with anxiety: Secondary | ICD-10-CM

## 2021-12-19 DIAGNOSIS — G6289 Other specified polyneuropathies: Secondary | ICD-10-CM

## 2021-12-19 DIAGNOSIS — F0280 Dementia in other diseases classified elsewhere without behavioral disturbance: Secondary | ICD-10-CM

## 2021-12-19 NOTE — Assessment & Plan Note (Signed)
takes Atorvastatin, LDL 6610/2022

## 2021-12-19 NOTE — Assessment & Plan Note (Addendum)
weight loss #104Ibs>>#103Ibs>>#99Ibs>>#96Ibs in the past 4 months. Denied GI symptoms. Will update CBC/diff, CMP/eGFR, TSH, will try Mirtazapine 7.81m to help sleep and appetite. Dietary recommendation.  12/21/21 Na 142, K 3.7, Bun 8, creat 0.71, TSH 2.61, wbc 2.9, Hgb 11.4, plt 329, neutrophils 33.8

## 2021-12-19 NOTE — Assessment & Plan Note (Signed)
Vit B12 level 212 05/11/21. On Vit B12

## 2021-12-19 NOTE — Assessment & Plan Note (Signed)
Blood pressure is controlled, no meds.

## 2021-12-19 NOTE — Assessment & Plan Note (Signed)
onset 2010, exacerbated by chemotherapy for uterine cancer, MRI brain 2011 atrophy, small  vessel dx, under Neurology evaluation, paranoia, agitation, packing, anxious at night, did not tolerate Memantine, Donepezil. TSH 2.71 05/11/21

## 2021-12-19 NOTE — Progress Notes (Signed)
Location:   [facility] Room Number: 58 Place of Service:  ALF (13) Provider: Lennie Odor Eilene Voigt NP  Yvonna Alanis, NP  Patient Care Team: Yvonna Alanis, NP as PCP - General (Adult Health Nurse Practitioner) Christophe Louis, MD as Consulting Physician (Obstetrics and Gynecology) Dennie Bible, NP as Nurse Practitioner (Neurology) Polly Cobia, [place] Bailiff, MD as Consulting Physician (Gynecologic Oncology)  Extended Emergency Contact Information Primary Emergency Contact: Ashby,Paul Address: [address]          [place], [place] [address] [place] of [place] Phone: [phone] Mobile Phone: [phone] Relation: Spouse Secondary Emergency Contact: O'Keeffe,Joanne Mobile Phone: [phone] Relation: Daughter  Code Status:  DNR Goals of care: Advanced Directive information Advanced Directives 07/26/2021  Does Patient Have a Medical Advance Directive? Yes  Type of Paramedic of [place];Living will  Does patient want to make changes to medical advance directive? No - Patient declined  Copy of [place] in Chart? Yes - validated most recent copy scanned in chart (See row information)  Would patient like information on creating a medical advance directive? -  Pre-existing out of facility DNR order (yellow form or pink MOST form) -     Chief Complaint  Patient presents with   Acute Visit    Weight loss    HPI:  Pt is a 87 y.o. female seen today for an acute visit for weight loss #104Ibs>>#103Ibs>>#99Ibs>>#96Ibs in the past 4 months. Denied GI symptoms.     Vitamin B12 deficiency, Vit B12 level 212 05/11/21. On Vit B12             Alzheimer's dementia, onset 2010, exacerbated by chemotherapy for uterine cancer, MRI brain 2011 atrophy, small  vessel dx, under Neurology evaluation, paranoia, agitation, packing, anxious at night, did not tolerate Memantine, Donepezil. TSH 2.71 05/11/21             Anxiety: treated with Depakote  167m bid, Sertraline 267mqd, Loraezpam 0.5 hs prn              HTN, no meds.              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 KiLyndon CodeMD DOB/Age 85/August 01, 1936Age: [age]Gender: F Date Taken: 07/28/2009 Date Received: 07/28/2009  FINAL DIAGNOSIS  MICROSCOPIC EXAMINATION AND DIAGNOSIS  UTERUS, OVARIES AND FALLOPIAN TUBES, HYSTERECTOMY AND BILATERAL SALPINGO-OOPHORECTOMY: - CARCINOSARCOMA, ARISING FROM THE LOWER UTERINE SEGMENT, SPANNING 4.5 CM. - CARCINOSARCOMA IS 0.1 CM TO THE DEEP SOFT TISSUE RESECTION MARGIN. - CERVIX: ATROPHY AND CHRONIC INFLAMMATION. - ENDOMETRIUM: ATROPHIC APPEARING ENDOMETRIUM. - MYOMETRIUM: LEIOMYOMATA. - SEROSA: ESSENTIALLY UNREMARKABLE. - ADNEXA: PARATUBAL CYST  COMMENT Immunohistochemical stains were performed on the malignant neoplasm with the following results. The controls stained appropriately.  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 If you have any questions, ask your nurse or doctor.          atorvastatin 10 MG tablet Commonly known as: LIPITOR Take 10 mg by mouth every other day.   atorvastatin 20 MG tablet Commonly known as: LIPITOR Take 20 mg by mouth every other day. Alternate 10 mg (1/2 tablet) with 20 mg (1 tablet ) every other day   BENEFIBER PO Take 15 mLs by mouth 2 (two) times daily.   cyanocobalamin 1000 MCG tablet Take 1,000 mcg by mouth daily.   diclofenac Sodium 1 % Gel Commonly known as: VOLTAREN Apply 1 g topically in the morning, at noon, and at bedtime.   divalproex 125 MG DR tablet Commonly known as: DEPAKOTE Take 125 mg by mouth 2 (two) times daily.   gabapentin 100 MG capsule Commonly known as: NEURONTIN Take 100 mg by mouth 2 (two) times daily.   LORazepam 0.5 MG tablet Commonly known as: ATIVAN Take 1 tablet (0.5 mg total) by mouth at bedtime as needed for anxiety.   ondansetron 4 MG disintegrating tablet Commonly known as: ZOFRAN-ODT Take 4 mg by mouth every 4 (four) hours as needed for nausea or vomiting.   sertraline 25 MG tablet Commonly known as: ZOLOFT Take 25 mg by mouth every morning.        Review of Systems  Unable to perform ROS: Dementia   Immunization History  Administered Date(s) Administered   Influenza, High Dose Seasonal PF 07/26/2017   Influenza-Unspecified 08/06/2018, 07/08/2019, 07/22/2020, 09/05/2021   PFIZER(Purple Top)SARS-COV-2 Vaccination 12/25/2019, 01/17/2020, 08/14/2020, 03/24/2021   Pfizer Covid-19 Vaccine Bivalent Booster 53yr & up 08/02/2021   Pneumococcal Conjugate-13 10/01/2014   Pneumococcal  Polysaccharide-23 07/02/2004   Td 07/02/2008   Tdap 09/02/2018   Zoster Recombinat (Shingrix) 10/17/2020, 01/17/2021   Zoster, Live 07/02/2008   Pertinent  Health Maintenance Due  Topic Date Due   DEXA SCAN  Never done   INFLUENZA VACCINE  Completed   Fall Risk 07/26/2021  Falls in the past year? 1  Was there an injury with Fall? 0  Fall Risk Category Calculator 1  Fall Risk Category Low  Patient Fall Risk Level Moderate fall risk  Patient at Risk for Falls Due to Other (Comment)  Patient at Risk for Falls Due to Alzheimers dementia  Fall risk Follow up Falls evaluation completed;Education provided;Falls prevention discussed   Functional Status Survey:    Vitals:   12/19/21 1336  BP: 108/85  Pulse: 70  Resp: 18  Temp: 97.9 F (  36.6 C)  SpO2: 97%  Weight: 96 lb (43.5 kg)   Body mass index is 15.98 kg/m. Physical Exam Constitutional:      Appearance: Normal appearance.     Comments: Weight loss #8Ibs in the past 4 months.   HENT:     Head: Normocephalic and atraumatic.     Mouth/Throat:     Mouth: Mucous membranes are moist.  Eyes:     Extraocular Movements: Extraocular movements intact.     Conjunctiva/sclera: Conjunctivae normal.     Pupils: Pupils are equal, round, and reactive to light.  Cardiovascular:     Rate and Rhythm: Normal rate and regular rhythm.     Heart sounds: No murmur heard. Pulmonary:     Effort: Pulmonary effort is normal.     Breath sounds: No rales.  Abdominal:     General: Bowel sounds are normal.     Palpations: Abdomen is soft.     Tenderness: There is no abdominal tenderness.  Musculoskeletal:     Cervical back: Normal range of motion and neck supple.     Right lower leg: No edema.     Left lower leg: No edema.  Skin:    General: Skin is warm and dry.  Neurological:     General: No focal deficit present.     Mental Status: She is alert. Mental status is at baseline.     Gait: Gait abnormal.     Comments: Oriented to person    Psychiatric:     Comments: Followed directions during my examination    Labs reviewed: Recent Labs    05/11/21 0000  NA 142  K 3.9  CL 109*  CO2 27*  BUN 12  CREATININE 0.6  CALCIUM 9.2   Recent Labs    05/11/21 0000  AST 14  ALT 11  ALKPHOS 72  ALBUMIN 3.9   Recent Labs    05/11/21 0000  WBC 5.0  NEUTROABS 2,425.00  HGB 12.7  HCT 39  PLT 327   Lab Results  Component Value Date   TSH 2.71 05/11/2021   Lab Results  Component Value Date   HGBA1C 5.4 08/14/2018   No results found for: CHOL, HDL, LDLCALC, LDLDIRECT, TRIG, CHOLHDL  Significant Diagnostic Results in last 30 days:  No results found.  Assessment/Plan Weight loss weight loss #104Ibs>>#103Ibs>>#99Ibs>>#96Ibs in the past 4 months. Denied GI symptoms. Will update CBC/diff, CMP/eGFR, TSH, will try Mirtazapine 7.34m to help sleep and appetite. Dietary recommendation.   Vitamin B12 deficiency Vit B12 level 212 05/11/21. On Vit B12  Alzheimer's dementia without behavioral disturbance ([place], exacerbated by chemotherapy for uterine cancer, MRI brain 2011 atrophy, small  vessel dx, under Neurology evaluation, paranoia, agitation, packing, anxious at night, did not tolerate Memantine, Donepezil. TSH 2.71 05/11/21  Anxiety due to dementia ([facility] treated with Depakote 1211mbid, Sertraline 2570md, Loraezpam 0.5 hs prn   HTN (hypertension) Blood pressure is controlled, no meds.   Peripheral neuropathy pain, numbness, burning, restarted Gabapentin,  Onset 2010  Hypercholesterolemia takes Atorvastatin, LDL 6610/2022    Family/ staff Communication: plan of care reviewed with the patient and charge nurse.   Labs/tests ordered:  CBC/diff, CMP/eGFR, TSH  Time spend 40 minutes.

## 2021-12-19 NOTE — Assessment & Plan Note (Signed)
treated with Depakote 125mg  bid, Sertraline 25mg  qd, Loraezpam 0.5 hs prn

## 2021-12-19 NOTE — Assessment & Plan Note (Signed)
pain, numbness, burning, restarted Gabapentin,  Onset 2010

## 2021-12-21 DIAGNOSIS — E039 Hypothyroidism, unspecified: Secondary | ICD-10-CM | POA: Diagnosis not present

## 2021-12-21 DIAGNOSIS — F0390 Unspecified dementia without behavioral disturbance: Secondary | ICD-10-CM | POA: Diagnosis not present

## 2021-12-21 DIAGNOSIS — D649 Anemia, unspecified: Secondary | ICD-10-CM | POA: Diagnosis not present

## 2021-12-21 LAB — HEPATIC FUNCTION PANEL
ALT: 4 U/L — AB (ref 7–35)
AST: 10 — AB (ref 13–35)
Alkaline Phosphatase: 60 (ref 25–125)
Bilirubin, Total: 0.4

## 2021-12-21 LAB — CBC AND DIFFERENTIAL
HCT: 34 — AB (ref 36–46)
Hemoglobin: 11.4 — AB (ref 12.0–16.0)
Neutrophils Absolute: 980
Platelets: 329 10*3/uL (ref 150–400)
WBC: 2.9

## 2021-12-21 LAB — BASIC METABOLIC PANEL
BUN: 8 (ref 4–21)
CO2: 25 — AB (ref 13–22)
Chloride: 107 (ref 99–108)
Creatinine: 0.7 (ref 0.5–1.1)
Glucose: 62
Potassium: 3.7 mEq/L (ref 3.5–5.1)
Sodium: 142 (ref 137–147)

## 2021-12-21 LAB — COMPREHENSIVE METABOLIC PANEL
Albumin: 3 — AB (ref 3.5–5.0)
Calcium: 8.7 (ref 8.7–10.7)
eGFR: 95

## 2021-12-21 LAB — CBC: RBC: 3.58 — AB (ref 3.87–5.11)

## 2021-12-22 ENCOUNTER — Encounter: Payer: Self-pay | Admitting: Nurse Practitioner

## 2021-12-27 DIAGNOSIS — R2681 Unsteadiness on feet: Secondary | ICD-10-CM | POA: Diagnosis not present

## 2021-12-27 DIAGNOSIS — M6281 Muscle weakness (generalized): Secondary | ICD-10-CM | POA: Diagnosis not present

## 2021-12-27 DIAGNOSIS — F0280 Dementia in other diseases classified elsewhere without behavioral disturbance: Secondary | ICD-10-CM | POA: Diagnosis not present

## 2021-12-27 DIAGNOSIS — R2689 Other abnormalities of gait and mobility: Secondary | ICD-10-CM | POA: Diagnosis not present

## 2021-12-28 DIAGNOSIS — F0280 Dementia in other diseases classified elsewhere without behavioral disturbance: Secondary | ICD-10-CM | POA: Diagnosis not present

## 2021-12-28 DIAGNOSIS — R2681 Unsteadiness on feet: Secondary | ICD-10-CM | POA: Diagnosis not present

## 2021-12-28 DIAGNOSIS — M6281 Muscle weakness (generalized): Secondary | ICD-10-CM | POA: Diagnosis not present

## 2021-12-28 DIAGNOSIS — R2689 Other abnormalities of gait and mobility: Secondary | ICD-10-CM | POA: Diagnosis not present

## 2021-12-29 DIAGNOSIS — M6281 Muscle weakness (generalized): Secondary | ICD-10-CM | POA: Diagnosis not present

## 2021-12-29 DIAGNOSIS — R2681 Unsteadiness on feet: Secondary | ICD-10-CM | POA: Diagnosis not present

## 2021-12-29 DIAGNOSIS — R2689 Other abnormalities of gait and mobility: Secondary | ICD-10-CM | POA: Diagnosis not present

## 2021-12-29 DIAGNOSIS — F0280 Dementia in other diseases classified elsewhere without behavioral disturbance: Secondary | ICD-10-CM | POA: Diagnosis not present

## 2022-01-01 DIAGNOSIS — F0280 Dementia in other diseases classified elsewhere without behavioral disturbance: Secondary | ICD-10-CM | POA: Diagnosis not present

## 2022-01-01 DIAGNOSIS — M6281 Muscle weakness (generalized): Secondary | ICD-10-CM | POA: Diagnosis not present

## 2022-01-01 DIAGNOSIS — R2681 Unsteadiness on feet: Secondary | ICD-10-CM | POA: Diagnosis not present

## 2022-01-01 DIAGNOSIS — R2689 Other abnormalities of gait and mobility: Secondary | ICD-10-CM | POA: Diagnosis not present

## 2022-01-04 DIAGNOSIS — R2689 Other abnormalities of gait and mobility: Secondary | ICD-10-CM | POA: Diagnosis not present

## 2022-01-04 DIAGNOSIS — R2681 Unsteadiness on feet: Secondary | ICD-10-CM | POA: Diagnosis not present

## 2022-01-04 DIAGNOSIS — F0280 Dementia in other diseases classified elsewhere without behavioral disturbance: Secondary | ICD-10-CM | POA: Diagnosis not present

## 2022-01-04 DIAGNOSIS — M6281 Muscle weakness (generalized): Secondary | ICD-10-CM | POA: Diagnosis not present

## 2022-01-05 DIAGNOSIS — M6281 Muscle weakness (generalized): Secondary | ICD-10-CM | POA: Diagnosis not present

## 2022-01-05 DIAGNOSIS — F0280 Dementia in other diseases classified elsewhere without behavioral disturbance: Secondary | ICD-10-CM | POA: Diagnosis not present

## 2022-01-05 DIAGNOSIS — R2689 Other abnormalities of gait and mobility: Secondary | ICD-10-CM | POA: Diagnosis not present

## 2022-01-05 DIAGNOSIS — R2681 Unsteadiness on feet: Secondary | ICD-10-CM | POA: Diagnosis not present

## 2022-01-07 DIAGNOSIS — R2681 Unsteadiness on feet: Secondary | ICD-10-CM | POA: Diagnosis not present

## 2022-01-07 DIAGNOSIS — F0280 Dementia in other diseases classified elsewhere without behavioral disturbance: Secondary | ICD-10-CM | POA: Diagnosis not present

## 2022-01-07 DIAGNOSIS — M6281 Muscle weakness (generalized): Secondary | ICD-10-CM | POA: Diagnosis not present

## 2022-01-07 DIAGNOSIS — R2689 Other abnormalities of gait and mobility: Secondary | ICD-10-CM | POA: Diagnosis not present

## 2022-01-08 DIAGNOSIS — M6281 Muscle weakness (generalized): Secondary | ICD-10-CM | POA: Diagnosis not present

## 2022-01-08 DIAGNOSIS — R2681 Unsteadiness on feet: Secondary | ICD-10-CM | POA: Diagnosis not present

## 2022-01-08 DIAGNOSIS — F0280 Dementia in other diseases classified elsewhere without behavioral disturbance: Secondary | ICD-10-CM | POA: Diagnosis not present

## 2022-01-08 DIAGNOSIS — R2689 Other abnormalities of gait and mobility: Secondary | ICD-10-CM | POA: Diagnosis not present

## 2022-01-09 DIAGNOSIS — R2681 Unsteadiness on feet: Secondary | ICD-10-CM | POA: Diagnosis not present

## 2022-01-09 DIAGNOSIS — M6281 Muscle weakness (generalized): Secondary | ICD-10-CM | POA: Diagnosis not present

## 2022-01-09 DIAGNOSIS — F0280 Dementia in other diseases classified elsewhere without behavioral disturbance: Secondary | ICD-10-CM | POA: Diagnosis not present

## 2022-01-09 DIAGNOSIS — R2689 Other abnormalities of gait and mobility: Secondary | ICD-10-CM | POA: Diagnosis not present

## 2022-01-11 DIAGNOSIS — R2689 Other abnormalities of gait and mobility: Secondary | ICD-10-CM | POA: Diagnosis not present

## 2022-01-11 DIAGNOSIS — M6281 Muscle weakness (generalized): Secondary | ICD-10-CM | POA: Diagnosis not present

## 2022-01-11 DIAGNOSIS — R2681 Unsteadiness on feet: Secondary | ICD-10-CM | POA: Diagnosis not present

## 2022-01-12 DIAGNOSIS — M6281 Muscle weakness (generalized): Secondary | ICD-10-CM | POA: Diagnosis not present

## 2022-01-12 DIAGNOSIS — R2689 Other abnormalities of gait and mobility: Secondary | ICD-10-CM | POA: Diagnosis not present

## 2022-01-12 DIAGNOSIS — R2681 Unsteadiness on feet: Secondary | ICD-10-CM | POA: Diagnosis not present

## 2022-01-15 DIAGNOSIS — R2681 Unsteadiness on feet: Secondary | ICD-10-CM | POA: Diagnosis not present

## 2022-01-15 DIAGNOSIS — R2689 Other abnormalities of gait and mobility: Secondary | ICD-10-CM | POA: Diagnosis not present

## 2022-01-15 DIAGNOSIS — M6281 Muscle weakness (generalized): Secondary | ICD-10-CM | POA: Diagnosis not present

## 2022-01-16 DIAGNOSIS — R2681 Unsteadiness on feet: Secondary | ICD-10-CM | POA: Diagnosis not present

## 2022-01-16 DIAGNOSIS — M6281 Muscle weakness (generalized): Secondary | ICD-10-CM | POA: Diagnosis not present

## 2022-01-16 DIAGNOSIS — R2689 Other abnormalities of gait and mobility: Secondary | ICD-10-CM | POA: Diagnosis not present

## 2022-01-17 DIAGNOSIS — R2689 Other abnormalities of gait and mobility: Secondary | ICD-10-CM | POA: Diagnosis not present

## 2022-01-17 DIAGNOSIS — M6281 Muscle weakness (generalized): Secondary | ICD-10-CM | POA: Diagnosis not present

## 2022-01-17 DIAGNOSIS — R2681 Unsteadiness on feet: Secondary | ICD-10-CM | POA: Diagnosis not present

## 2022-01-18 DIAGNOSIS — M6281 Muscle weakness (generalized): Secondary | ICD-10-CM | POA: Diagnosis not present

## 2022-01-18 DIAGNOSIS — R2681 Unsteadiness on feet: Secondary | ICD-10-CM | POA: Diagnosis not present

## 2022-01-18 DIAGNOSIS — R2689 Other abnormalities of gait and mobility: Secondary | ICD-10-CM | POA: Diagnosis not present

## 2022-01-19 DIAGNOSIS — R2681 Unsteadiness on feet: Secondary | ICD-10-CM | POA: Diagnosis not present

## 2022-01-19 DIAGNOSIS — M6281 Muscle weakness (generalized): Secondary | ICD-10-CM | POA: Diagnosis not present

## 2022-01-19 DIAGNOSIS — R2689 Other abnormalities of gait and mobility: Secondary | ICD-10-CM | POA: Diagnosis not present

## 2022-01-23 DIAGNOSIS — R2689 Other abnormalities of gait and mobility: Secondary | ICD-10-CM | POA: Diagnosis not present

## 2022-01-23 DIAGNOSIS — M6281 Muscle weakness (generalized): Secondary | ICD-10-CM | POA: Diagnosis not present

## 2022-01-23 DIAGNOSIS — R2681 Unsteadiness on feet: Secondary | ICD-10-CM | POA: Diagnosis not present

## 2022-01-25 DIAGNOSIS — R2681 Unsteadiness on feet: Secondary | ICD-10-CM | POA: Diagnosis not present

## 2022-01-25 DIAGNOSIS — R2689 Other abnormalities of gait and mobility: Secondary | ICD-10-CM | POA: Diagnosis not present

## 2022-01-25 DIAGNOSIS — M6281 Muscle weakness (generalized): Secondary | ICD-10-CM | POA: Diagnosis not present

## 2022-01-26 DIAGNOSIS — R2689 Other abnormalities of gait and mobility: Secondary | ICD-10-CM | POA: Diagnosis not present

## 2022-01-26 DIAGNOSIS — R2681 Unsteadiness on feet: Secondary | ICD-10-CM | POA: Diagnosis not present

## 2022-01-26 DIAGNOSIS — M6281 Muscle weakness (generalized): Secondary | ICD-10-CM | POA: Diagnosis not present

## 2022-01-29 DIAGNOSIS — R2689 Other abnormalities of gait and mobility: Secondary | ICD-10-CM | POA: Diagnosis not present

## 2022-01-29 DIAGNOSIS — R2681 Unsteadiness on feet: Secondary | ICD-10-CM | POA: Diagnosis not present

## 2022-01-29 DIAGNOSIS — M6281 Muscle weakness (generalized): Secondary | ICD-10-CM | POA: Diagnosis not present

## 2022-01-30 DIAGNOSIS — R2681 Unsteadiness on feet: Secondary | ICD-10-CM | POA: Diagnosis not present

## 2022-01-30 DIAGNOSIS — M6281 Muscle weakness (generalized): Secondary | ICD-10-CM | POA: Diagnosis not present

## 2022-01-30 DIAGNOSIS — R2689 Other abnormalities of gait and mobility: Secondary | ICD-10-CM | POA: Diagnosis not present

## 2022-02-01 DIAGNOSIS — M6281 Muscle weakness (generalized): Secondary | ICD-10-CM | POA: Diagnosis not present

## 2022-02-01 DIAGNOSIS — R2689 Other abnormalities of gait and mobility: Secondary | ICD-10-CM | POA: Diagnosis not present

## 2022-02-01 DIAGNOSIS — R2681 Unsteadiness on feet: Secondary | ICD-10-CM | POA: Diagnosis not present

## 2022-02-02 DIAGNOSIS — R2681 Unsteadiness on feet: Secondary | ICD-10-CM | POA: Diagnosis not present

## 2022-02-02 DIAGNOSIS — R2689 Other abnormalities of gait and mobility: Secondary | ICD-10-CM | POA: Diagnosis not present

## 2022-02-02 DIAGNOSIS — M6281 Muscle weakness (generalized): Secondary | ICD-10-CM | POA: Diagnosis not present

## 2022-02-05 DIAGNOSIS — R2689 Other abnormalities of gait and mobility: Secondary | ICD-10-CM | POA: Diagnosis not present

## 2022-02-05 DIAGNOSIS — M6281 Muscle weakness (generalized): Secondary | ICD-10-CM | POA: Diagnosis not present

## 2022-02-05 DIAGNOSIS — R2681 Unsteadiness on feet: Secondary | ICD-10-CM | POA: Diagnosis not present

## 2022-02-06 ENCOUNTER — Encounter: Payer: Self-pay | Admitting: Nurse Practitioner

## 2022-02-06 ENCOUNTER — Non-Acute Institutional Stay: Payer: Medicare HMO | Admitting: Nurse Practitioner

## 2022-02-06 DIAGNOSIS — R0902 Hypoxemia: Secondary | ICD-10-CM | POA: Diagnosis not present

## 2022-02-06 DIAGNOSIS — G309 Alzheimer's disease, unspecified: Secondary | ICD-10-CM

## 2022-02-06 DIAGNOSIS — G6289 Other specified polyneuropathies: Secondary | ICD-10-CM

## 2022-02-06 DIAGNOSIS — I10 Essential (primary) hypertension: Secondary | ICD-10-CM

## 2022-02-06 DIAGNOSIS — F0394 Unspecified dementia, unspecified severity, with anxiety: Secondary | ICD-10-CM

## 2022-02-06 DIAGNOSIS — R2689 Other abnormalities of gait and mobility: Secondary | ICD-10-CM | POA: Diagnosis not present

## 2022-02-06 DIAGNOSIS — R197 Diarrhea, unspecified: Secondary | ICD-10-CM | POA: Diagnosis not present

## 2022-02-06 DIAGNOSIS — E7800 Pure hypercholesterolemia, unspecified: Secondary | ICD-10-CM

## 2022-02-06 DIAGNOSIS — M6281 Muscle weakness (generalized): Secondary | ICD-10-CM | POA: Diagnosis not present

## 2022-02-06 DIAGNOSIS — R2681 Unsteadiness on feet: Secondary | ICD-10-CM | POA: Diagnosis not present

## 2022-02-06 DIAGNOSIS — F0280 Dementia in other diseases classified elsewhere without behavioral disturbance: Secondary | ICD-10-CM

## 2022-02-06 LAB — BASIC METABOLIC PANEL
BUN: 19 (ref 4–21)
CO2: 26 — AB (ref 13–22)
Chloride: 108 (ref 99–108)
Creatinine: 0.7 (ref 0.5–1.1)
Glucose: 90
Potassium: 3.9 mEq/L (ref 3.5–5.1)
Sodium: 140 (ref 137–147)

## 2022-02-06 LAB — CBC AND DIFFERENTIAL
HCT: 36 (ref 36–46)
Hemoglobin: 11.9 — AB (ref 12.0–16.0)
Neutrophils Absolute: 4685
Platelets: 317 10*3/uL (ref 150–400)
WBC: 6.9

## 2022-02-06 LAB — HEPATIC FUNCTION PANEL
ALT: 9 U/L (ref 7–35)
AST: 14 (ref 13–35)
Alkaline Phosphatase: 63 (ref 25–125)
Bilirubin, Total: 0.5

## 2022-02-06 LAB — COMPREHENSIVE METABOLIC PANEL
Albumin: 3.7 (ref 3.5–5.0)
Calcium: 9.3 (ref 8.7–10.7)
Globulin: 2.6
eGFR: 83

## 2022-02-06 LAB — CBC: RBC: 3.69 — AB (ref 3.87–5.11)

## 2022-02-06 NOTE — Assessment & Plan Note (Signed)
takes Atorvastatin, LDL 66 08/2021 ? ?

## 2022-02-06 NOTE — Progress Notes (Signed)
?Location:   [facility] ?Nursing Home Room Number: 32 ?Place of Service:  ALF (13) ?Provider: Marlana Latus NP ? ?Yvonna Alanis, NP ? ?Patient Care Team: ?Yvonna Alanis, NP as PCP - General (Adult Health Nurse Practitioner) ?Christophe Louis, MD as Consulting Physician (Obstetrics and Gynecology) ?Dennie Bible, NP as Nurse Practitioner (Neurology) ?Hart Rochester, MD as Consulting Physician (Gynecologic Oncology) ? ?Extended Emergency Contact Information ?Primary Emergency Contact: Ashby,Paul ?Address: [address] ?         [place], [place] [address] [place] ?Home Phone: [phone] ?Mobile Phone: [phone] ?Relation: Spouse ?Secondary Emergency Contact: O'Keeffe,Joanne ?Mobile Phone: [phone] ?Relation: Daughter ? ?Code Status:  DNR ?Goals of care: Advanced Directive information ? ?  07/26/2021  ? 11:05 AM  ?Advanced Directives  ?Does Patient Have a Medical Advance Directive? Yes  ?Type of Paramedic of [place];Living will  ?Does patient want to make changes to medical advance directive? No - Patient declined  ?Copy of [place] in Chart? Yes - validated most recent copy scanned in chart (See row information)  ? ? ? ?Chief Complaint  ?Patient presents with  ? Acute Visit  ?  O2 desaturation, low Bp  ? ? ?HPI:  ?Pt is a 87 y.o. female seen today for an acute visit for low Bp, O2 Sat 77%>>91% when the patient's fingers warmed up. Denied dizziness, cough, wheezing, SOB, chest pain/pressures, or palpitation ?  ?Vitamin B12 deficiency, Vit B12 level 212 05/11/21. On Vit B12 ?            Alzheimer's dementia, onset 2010, exacerbated by chemotherapy for uterine cancer, MRI brain 2011 atrophy, small  vessel dx, under Neurology evaluation, paranoia, agitation, packing, anxious at night, did not tolerate Memantine, Donepezil. TSH 2.71 05/11/21 ?            Anxiety: treated with Depakote '125mg'$  bid, Sertraline '25mg'$  qd, Loraezpam 0.5 hs prn  ?            HTN,  no meds.  ?            Peripheral neuropathy pain, numbness, burning, restarted Gabapentin,  Onset 2010 ?            Hyperlipidemia, takes Atorvastatin, LDL 66 08/2021 ? ?Past Medical History:  ?Diagnosis Date  ? Abdominal adhesions   ? Alzheimer's disease with memory loss 06/25/2013  ? Back ache   ? Breast cancer ([place])   ? 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 REPORT OF SURGICAL PATHOLOGY  Case #: [id number] Patient Name: ARASELI, SHERRY Office Chart Number: N/A  MRN: [id number] Pathologist: Lennox Solders. Lyndon Code, MD DOB/Age [birth year] (Age: 55) Gender: F Date Taken: 07/28/2009 Date Received: 07/28/2009  FINAL DIAGNOSIS  MICROSCOPIC EXAMINATION AND DIAGNOSIS  UTERUS, OVARIES AND FALLOPIAN TUBES, HYSTERECTOMY AND BILATERAL SALPINGO-OOPHORECTOMY: - CARCINOSARCOMA, ARISING FROM THE LOWER UTERINE SEGMENT, SPANNING 4.5 CM. - CARCINOSARCOMA IS 0.1 CM TO THE DEEP SOFT TISSUE RESECTION MARGIN. - CERVIX: ATROPHY AND CHRONIC INFLAMMATION. - ENDOMETRIUM: ATROPHIC APPEARING ENDOMETRIUM. - MYOMETRIUM: LEIOMYOMATA. - SEROSA: ESSENTIALLY UNREMARKABLE. - ADNEXA: PARATUBAL CYST  COMMENT Immunohistochemical stains were performed on the malignant neoplasm with the following results. The controls stained appropriately.  P16 Positive Estrogen receptor Scattered positive cells Progesterone receptor Scattered positive cells Cytokeratin AE1/AE3 Positive in epithe  ? 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 High cholesterol   ? History of mitral valve prolapse   ? Hypertension   ?  Hypothyroidism   ? Ileus ([place])   ? Memory loss   ? Neuropathy   ? Overactive bladder   ? Rosacea   ? 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 Seasonal allergies   ? Shoulder joint pain   ? Uterine cancer ([place])   ? ?Past Surgical History:  ?Procedure Laterality Date  ? ABDOMINAL HYSTERECTOMY  2010  ? Dr Christophe Louis  ? ANKLE SURGERY Left   ? HERNIA REPAIR    ? as a child  ? MASTECTOMY PARTIAL /  LUMPECTOMY Left 1990  ? Breast sarcoma  ? ? ?Allergies  ?Allergen Reactions  ? Sulfa Antibiotics Anaphylaxis  ? ? ?Allergies as of 02/06/2022   ? ?   Reactions  ? Sulfa Antibiotics Anaphylaxis  ? ?  ? ?  ?Medication List  ?  ? ?  ? Accurate as of February 06, 2022  3:51 PM. If you have any questions, ask your nurse or doctor.  ?  ?  ? ?  ? ?atorvastatin 10 MG tablet ?Commonly known as: LIPITOR ?Take 10 mg by mouth every other day. ?  ?atorvastatin 20 MG tablet ?Commonly known as: LIPITOR ?Take 20 mg by mouth every other day. Alternate 10 mg (1/2 tablet) with 20 mg (1 tablet ) every other day ?  ?BENEFIBER PO ?Take 15 mLs by mouth 2 (two) times daily. ?  ?cyanocobalamin 1000 MCG tablet ?Take 1,000 mcg by mouth daily. ?  ?diclofenac Sodium 1 % Gel ?Commonly known as: VOLTAREN ?Apply 1 g topically in the morning, at noon, and at bedtime. ?  ?divalproex 125 MG DR tablet ?Commonly known as: DEPAKOTE ?Take 125 mg by mouth 2 (two) times daily. ?  ?gabapentin 100 MG capsule ?Commonly known as: NEURONTIN ?Take 100 mg by mouth 2 (two) times daily. ?  ?LORazepam 0.5 MG tablet ?Commonly known as: ATIVAN ?Take 1 tablet (0.5 mg total) by mouth at bedtime as needed for anxiety. ?  ?ondansetron 4 MG disintegrating tablet ?Commonly known as: ZOFRAN-ODT ?Take 4 mg by mouth every 4 (four) hours as needed for nausea or vomiting. ?  ?sertraline 25 MG tablet ?Commonly known as: ZOLOFT ?Take 25 mg by mouth every morning. ?  ? ?  ? ? ?Review of Systems  ?Unable to perform ROS: Dementia  ? ?Immunization History  ?Administered Date(s) Administered  ? Influenza, High Dose Seasonal PF 07/26/2017  ? Influenza-Unspecified 08/06/2018, 07/08/2019, 07/22/2020, 09/05/2021  ? PFIZER(Purple Top)SARS-COV-2 Vaccination 12/25/2019, 01/17/2020, 08/14/2020, 03/24/2021  ? Pension scheme manager 76yr & up 08/02/2021  ? Pneumococcal Conjugate-13 10/01/2014  ? Pneumococcal Polysaccharide-23 07/02/2004  ? Td 07/02/2008  ? Tdap 09/02/2018  ?  Zoster Recombinat (Shingrix) 10/17/2020, 01/17/2021  ? Zoster, Live 07/02/2008  ? ?Pertinent  Health Maintenance Due  ?Topic Date Due  ? DEXA SCAN  Never done  ? INFLUENZA VACCINE  Completed  ? ? ?  07/26/2021  ?  3:05 PM  ?Fall Risk  ?Falls in the past year? 1  ?Was there an injury with Fall? 0  ?Fall Risk Category Calculator 1  ?Fall Risk Category Low  ?Patient Fall Risk Level Moderate fall risk  ?Patient at Risk for Falls Due to Other (Comment)  ?Patient at Risk for Falls Due to - Comments Alzheimers dementia  ?Fall risk Follow up Falls evaluation completed;Education provided;Falls prevention discussed  ? ?Functional Status Survey: ?  ? ?Vitals:  ? 02/06/22 1245  ?BP: (!) 95/58  ?Pulse: 72  ?Resp: 16  ?  Temp: 98.2 ?F (36.8 ?C)  ?SpO2: 91%  ? ?There is no height or weight on file to calculate BMI. ?Physical Exam ?Constitutional:   ?   Appearance: Normal appearance.  ?HENT:  ?   Head: Normocephalic and atraumatic.  ?   Mouth/Throat:  ?   Mouth: Mucous membranes are moist.  ?Eyes:  ?   Extraocular Movements: Extraocular movements intact.  ?   Conjunctiva/sclera: Conjunctivae normal.  ?   Pupils: Pupils are equal, round, and reactive to light.  ?Cardiovascular:  ?   Rate and Rhythm: Normal rate and regular rhythm.  ?   Heart sounds: No murmur heard. ?Pulmonary:  ?   Effort: Pulmonary effort is normal.  ?   Breath sounds: No rales.  ?Abdominal:  ?   General: Bowel sounds are normal.  ?   Palpations: Abdomen is soft.  ?   Tenderness: There is no abdominal tenderness.  ?Musculoskeletal:  ?   Cervical back: Normal range of motion and neck supple.  ?   Right lower leg: No edema.  ?   Left lower leg: No edema.  ?Skin: ?   General: Skin is warm and dry.  ?Neurological:  ?   General: No focal deficit present.  ?   Mental Status: She is alert. Mental status is at baseline.  ?   Gait: Gait abnormal.  ?   Comments: Oriented to person   ?Psychiatric:  ?   Comments: Followed directions during my examination  ? ? ?Labs  reviewed: ?Recent Labs  ?  05/11/21 ?0000  ?NA 142  ?K 3.9  ?CL 109*  ?CO2 27*  ?BUN 12  ?CREATININE 0.6  ?CALCIUM 9.2  ? ?Recent Labs  ?  05/11/21 ?0000  ?AST 14  ?ALT 11  ?ALKPHOS 72  ?ALBUMIN 3.9  ? ?Recent Labs  ?  06/3

## 2022-02-06 NOTE — Assessment & Plan Note (Signed)
,   onset 2010, exacerbated by chemotherapy for uterine cancer, MRI brain 2011 atrophy, small  vessel dx, under Neurology evaluation, paranoia, agitation, packing, anxious at night, did not tolerate Memantine, Donepezil. TSH 2.71 05/11/21 ?

## 2022-02-06 NOTE — Assessment & Plan Note (Signed)
treated with Depakote '125mg'$  bid, Sertraline '25mg'$  qd, Loraezpam 0.5 hs prn  ?

## 2022-02-06 NOTE — Assessment & Plan Note (Signed)
Peripheral neuropathy pain, numbness, burning, restarted Gabapentin,  Onset 2010 ?

## 2022-02-06 NOTE — Assessment & Plan Note (Addendum)
O2 Sat 77%>>91% when the patient's fingers warmed up. Denied dizziness, cough, wheezing, SOB, chest pain/pressures, or palpitation ?CXR ap/lateral, CBC/diff, CMP/eGFR stat, ED eval if O2 sat persisted low.  ?

## 2022-02-06 NOTE — Assessment & Plan Note (Addendum)
Runs low, asymptomatic.  ?

## 2022-02-07 DIAGNOSIS — R0902 Hypoxemia: Secondary | ICD-10-CM | POA: Diagnosis not present

## 2022-02-08 DIAGNOSIS — M6281 Muscle weakness (generalized): Secondary | ICD-10-CM | POA: Diagnosis not present

## 2022-02-08 DIAGNOSIS — R2681 Unsteadiness on feet: Secondary | ICD-10-CM | POA: Diagnosis not present

## 2022-02-08 DIAGNOSIS — R2689 Other abnormalities of gait and mobility: Secondary | ICD-10-CM | POA: Diagnosis not present

## 2022-02-09 DIAGNOSIS — R2681 Unsteadiness on feet: Secondary | ICD-10-CM | POA: Diagnosis not present

## 2022-02-09 DIAGNOSIS — R2689 Other abnormalities of gait and mobility: Secondary | ICD-10-CM | POA: Diagnosis not present

## 2022-02-09 DIAGNOSIS — M6281 Muscle weakness (generalized): Secondary | ICD-10-CM | POA: Diagnosis not present

## 2022-02-12 DIAGNOSIS — R2681 Unsteadiness on feet: Secondary | ICD-10-CM | POA: Diagnosis not present

## 2022-02-12 DIAGNOSIS — R2689 Other abnormalities of gait and mobility: Secondary | ICD-10-CM | POA: Diagnosis not present

## 2022-02-12 DIAGNOSIS — F0280 Dementia in other diseases classified elsewhere without behavioral disturbance: Secondary | ICD-10-CM | POA: Diagnosis not present

## 2022-02-12 DIAGNOSIS — M6281 Muscle weakness (generalized): Secondary | ICD-10-CM | POA: Diagnosis not present

## 2022-02-13 DIAGNOSIS — M6281 Muscle weakness (generalized): Secondary | ICD-10-CM | POA: Diagnosis not present

## 2022-02-13 DIAGNOSIS — R2681 Unsteadiness on feet: Secondary | ICD-10-CM | POA: Diagnosis not present

## 2022-02-13 DIAGNOSIS — F0280 Dementia in other diseases classified elsewhere without behavioral disturbance: Secondary | ICD-10-CM | POA: Diagnosis not present

## 2022-02-13 DIAGNOSIS — R2689 Other abnormalities of gait and mobility: Secondary | ICD-10-CM | POA: Diagnosis not present

## 2022-02-15 DIAGNOSIS — R2689 Other abnormalities of gait and mobility: Secondary | ICD-10-CM | POA: Diagnosis not present

## 2022-02-15 DIAGNOSIS — M6281 Muscle weakness (generalized): Secondary | ICD-10-CM | POA: Diagnosis not present

## 2022-02-15 DIAGNOSIS — R2681 Unsteadiness on feet: Secondary | ICD-10-CM | POA: Diagnosis not present

## 2022-02-15 DIAGNOSIS — F0280 Dementia in other diseases classified elsewhere without behavioral disturbance: Secondary | ICD-10-CM | POA: Diagnosis not present

## 2022-02-19 DIAGNOSIS — F0280 Dementia in other diseases classified elsewhere without behavioral disturbance: Secondary | ICD-10-CM | POA: Diagnosis not present

## 2022-02-19 DIAGNOSIS — R2689 Other abnormalities of gait and mobility: Secondary | ICD-10-CM | POA: Diagnosis not present

## 2022-02-19 DIAGNOSIS — M6281 Muscle weakness (generalized): Secondary | ICD-10-CM | POA: Diagnosis not present

## 2022-02-19 DIAGNOSIS — R2681 Unsteadiness on feet: Secondary | ICD-10-CM | POA: Diagnosis not present

## 2022-02-21 DIAGNOSIS — F0280 Dementia in other diseases classified elsewhere without behavioral disturbance: Secondary | ICD-10-CM | POA: Diagnosis not present

## 2022-02-21 DIAGNOSIS — M6281 Muscle weakness (generalized): Secondary | ICD-10-CM | POA: Diagnosis not present

## 2022-02-21 DIAGNOSIS — R2689 Other abnormalities of gait and mobility: Secondary | ICD-10-CM | POA: Diagnosis not present

## 2022-02-21 DIAGNOSIS — R2681 Unsteadiness on feet: Secondary | ICD-10-CM | POA: Diagnosis not present

## 2022-02-22 DIAGNOSIS — R2689 Other abnormalities of gait and mobility: Secondary | ICD-10-CM | POA: Diagnosis not present

## 2022-02-22 DIAGNOSIS — R2681 Unsteadiness on feet: Secondary | ICD-10-CM | POA: Diagnosis not present

## 2022-02-22 DIAGNOSIS — F0280 Dementia in other diseases classified elsewhere without behavioral disturbance: Secondary | ICD-10-CM | POA: Diagnosis not present

## 2022-02-22 DIAGNOSIS — M6281 Muscle weakness (generalized): Secondary | ICD-10-CM | POA: Diagnosis not present

## 2022-02-23 ENCOUNTER — Non-Acute Institutional Stay: Payer: Medicare HMO | Admitting: Orthopedic Surgery

## 2022-02-23 ENCOUNTER — Encounter: Payer: Self-pay | Admitting: Orthopedic Surgery

## 2022-02-23 DIAGNOSIS — E538 Deficiency of other specified B group vitamins: Secondary | ICD-10-CM

## 2022-02-23 DIAGNOSIS — F0394 Unspecified dementia, unspecified severity, with anxiety: Secondary | ICD-10-CM

## 2022-02-23 DIAGNOSIS — R634 Abnormal weight loss: Secondary | ICD-10-CM

## 2022-02-23 DIAGNOSIS — G6289 Other specified polyneuropathies: Secondary | ICD-10-CM | POA: Diagnosis not present

## 2022-02-23 DIAGNOSIS — L602 Onychogryphosis: Secondary | ICD-10-CM | POA: Diagnosis not present

## 2022-02-23 DIAGNOSIS — E7800 Pure hypercholesterolemia, unspecified: Secondary | ICD-10-CM | POA: Diagnosis not present

## 2022-02-23 DIAGNOSIS — G309 Alzheimer's disease, unspecified: Secondary | ICD-10-CM

## 2022-02-23 DIAGNOSIS — F0280 Dementia in other diseases classified elsewhere without behavioral disturbance: Secondary | ICD-10-CM

## 2022-02-23 NOTE — Progress Notes (Signed)
?Location:  [facility] Room Number: AL32/A ?Place of Service:  ALF (13) ?Provider:  Yvonna Alanis, NP ? ?Patient Care Team: ?Yvonna Alanis, NP as PCP - General (Adult Health Nurse Practitioner) ?Christophe Louis, MD as Consulting Physician (Obstetrics and Gynecology) ?Dennie Bible, NP as Nurse Practitioner (Neurology) ?Hart Rochester, MD as Consulting Physician (Gynecologic Oncology) ? ?Extended Emergency Contact Information ?Primary Emergency Contact: Ashby,Paul ?Address: [address] ?         [place], [place] [address] [place] ?Home Phone: [phone] ?Mobile Phone: [phone] ?Relation: Spouse ?Secondary Emergency Contact: O'Keeffe,Joanne ?Mobile Phone: [phone] ?Relation: Daughter ? ?Code Status: DNR ?Goals of care: Advanced Directive information ? ?  02/23/2022  ? 11:00 AM  ?Advanced Directives  ?Does Patient Have a Medical Advance Directive? Yes  ?Type of Paramedic of [place];Living will  ?Does patient want to make changes to medical advance directive? No - Patient declined  ?Copy of [place] in Chart? Yes - validated most recent copy scanned in chart (See row information)  ? ? ? ?Chief Complaint  ?Patient presents with  ? Medical Management of Chronic Issues  ?  Routine visit.  ? Quality Metric Gaps  ?  Discuss the need for Dexa scan, or post pone if patient refuses.   ? ? ?HPI:  ?Pt is a 87 y.o. female seen today for medical management of chronic diseases.   ? ?Alzheimer's disease- onset 2010, exacerbated by chemotherapy for uterine cancer, poor tolerance to Aricept and Namenda in past, behaviors of increased anxiety/agitation- improved since starting Depakote, walking less, does not enjoy care rides anymore, wander guard placed last week, remains on ativan prn ?Weight loss- see weights below, Remeron started last month ?HLD- LDL 66 08/2021, remains on Lipitor ?Polyneuropathy- onset 2010, remains on  gabapentin ?Anxiety- remains on Zoloft ?Vitamin B12 deficiency-  Vit B12 686 08/2021, remains on cyanocobalamin ? ?No recent falls or injuries.  ? ?Husband reports she is eating less of her favorite foods. Drinks Boost well.  ? ?Recent blood pressures: ? 04/12- 93/60 ? 04/05- 102/62 ? 03/29- 122/76 ? ?Recent weights: ? 04/03- 90.8 lbs ? 03/01- 100.2 lbs ? 02/01- 96.2 lbs ? ? ? ? ? ?Past Medical History:  ?Diagnosis Date  ? Abdominal adhesions   ? Alzheimer's disease with memory loss 06/25/2013  ? Back ache   ? Breast cancer ([facility])   ? 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 REPORT OF SURGICAL PATHOLOGY  Case #: [id number] Patient Name: DIYA, GERVASI Office Chart Number: N/A  MRN: [id number] Pathologist: Lennox Solders. Lyndon Code, MD DOB/Age 06-29-1935 (Age: 70) Gender: F Date Taken: 07/28/2009 Date Received: 07/28/2009  FINAL DIAGNOSIS  MICROSCOPIC EXAMINATION AND DIAGNOSIS  UTERUS, OVARIES AND FALLOPIAN TUBES, HYSTERECTOMY AND BILATERAL SALPINGO-OOPHORECTOMY: - CARCINOSARCOMA, ARISING FROM THE LOWER UTERINE SEGMENT, SPANNING 4.5 CM. - CARCINOSARCOMA IS 0.1 CM TO THE DEEP SOFT TISSUE RESECTION MARGIN. - CERVIX: ATROPHY AND CHRONIC INFLAMMATION. - ENDOMETRIUM: ATROPHIC APPEARING ENDOMETRIUM. - MYOMETRIUM: LEIOMYOMATA. - SEROSA: ESSENTIALLY UNREMARKABLE. - ADNEXA: PARATUBAL CYST  COMMENT Immunohistochemical stains were performed on the malignant neoplasm with the following results. The controls stained appropriately.  P16 Positive Estrogen receptor Scattered positive cells Progesterone receptor Scattered positive cells Cytokeratin AE1/AE3 Positive in epithe  ? 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 High cholesterol   ? History of mitral valve prolapse   ? Hypertension   ? Hypothyroidism   ? Ileus (  [facility])   ? Memory loss   ? Neuropathy   ? Overactive bladder   ? Rosacea   ? Sarcoma of central portion of left female breast ([facility])   ? Seasonal allergies   ? Shoulder joint pain    ? Uterine cancer ([facility])   ? ?Past Surgical History:  ?Procedure Laterality Date  ? ABDOMINAL HYSTERECTOMY  2010  ? Dr Christophe Louis  ? ANKLE SURGERY Left   ? HERNIA REPAIR    ? as a child  ? MASTECTOMY PARTIAL / LUMPECTOMY Left 1990  ? Breast sarcoma  ? ? ?Allergies  ?Allergen Reactions  ? Sulfa Antibiotics Anaphylaxis  ? ? ?Outpatient Encounter Medications as of 02/23/2022  ?Medication Sig  ? atorvastatin (LIPITOR) 10 MG tablet Take 10 mg by mouth every other day.  ? atorvastatin (LIPITOR) 20 MG tablet Take 20 mg by mouth every other day. Alternate 10 mg (1/2 tablet) with 20 mg (1 tablet ) every other day  ? cyanocobalamin 1000 MCG tablet Take 1,000 mcg by mouth daily.  ? diclofenac Sodium (VOLTAREN) 1 % GEL Apply 1 g topically in the morning, at noon, and at bedtime.  ? divalproex (DEPAKOTE) 125 MG DR tablet Take 125 mg by mouth 2 (two) times daily.  ? gabapentin (NEURONTIN) 100 MG capsule Take 100 mg by mouth 2 (two) times daily.  ? LORazepam (ATIVAN) 0.5 MG tablet Take 1 tablet (0.5 mg total) by mouth at bedtime as needed for anxiety.  ? mirtazapine (REMERON) 7.5 MG tablet Take 7.5 mg by mouth daily.  ? ondansetron (ZOFRAN-ODT) 4 MG disintegrating tablet Take 4 mg by mouth every 4 (four) hours as needed for nausea or vomiting.  ? sertraline (ZOLOFT) 25 MG tablet Take 25 mg by mouth every morning.  ? Wheat Dextrin (BENEFIBER PO) Take 15 mLs by mouth 2 (two) times daily.  ? ?No facility-administered encounter medications on file as of 02/23/2022.  ? ? ?Review of Systems  ?Unable to perform ROS: Dementia  ? ?Immunization History  ?Administered Date(s) Administered  ? Influenza, High Dose Seasonal PF 07/26/2017  ? Influenza-Unspecified 08/06/2018, 07/08/2019, 07/22/2020, 09/05/2021  ? PFIZER(Purple Top)SARS-COV-2 Vaccination 12/25/2019, 01/17/2020, 08/14/2020, 03/24/2021  ? Pension scheme manager 58yr & up 08/02/2021  ? Pneumococcal Conjugate-13 10/01/2014  ? Pneumococcal Polysaccharide-23 07/02/2004  ?  Td 07/02/2008  ? Tdap 09/02/2018  ? Zoster Recombinat (Shingrix) 10/17/2020, 01/17/2021  ? Zoster, Live 07/02/2008  ? ?Pertinent  Health Maintenance Due  ?Topic Date Due  ? DEXA SCAN  Never done  ? INFLUENZA VACCINE  06/12/2022  ? ? ?  07/26/2021  ?  3:05 PM  ?Fall Risk  ?Falls in the past year? 1  ?Was there an injury with Fall? 0  ?Fall Risk Category Calculator 1  ?Fall Risk Category Low  ?Patient Fall Risk Level Moderate fall risk  ?Patient at Risk for Falls Due to Other (Comment)  ?Patient at Risk for Falls Due to - Comments Alzheimers dementia  ?Fall risk Follow up Falls evaluation completed;Education provided;Falls prevention discussed  ? ?Functional Status Survey: ?  ? ?Vitals:  ? 02/23/22 1055  ?BP: 93/60  ?Pulse: 76  ?Resp: 18  ?Temp: 97.8 ?F (36.6 ?C)  ?SpO2: 93%  ?Weight: 90 lb 12.8 oz (41.2 kg)  ?Height: '5\' 5"'$  (1.651 m)  ? ?Body mass index is 15.11 kg/m?.Marland Kitchen?Physical Exam ?Vitals reviewed.  ?Constitutional:   ?   General: She is not in acute distress. ?   Comments: Frail appearance  ?HENT:  ?  Head: Normocephalic.  ?   Right Ear: There is no impacted cerumen.  ?   Left Ear: There is no impacted cerumen.  ?   Nose: Nose normal.  ?   Mouth/Throat:  ?   Mouth: Mucous membranes are moist.  ?Eyes:  ?   General:     ?   Right eye: No discharge.     ?   Left eye: No discharge.  ?Neck:  ?   Vascular: No carotid bruit.  ?Cardiovascular:  ?   Rate and Rhythm: Regular rhythm.  ?   Pulses: Normal pulses.  ?   Heart sounds: Normal heart sounds.  ?Pulmonary:  ?   Effort: Pulmonary effort is normal. No respiratory distress.  ?   Breath sounds: Normal breath sounds. No wheezing.  ?Abdominal:  ?   General: Abdomen is flat. Bowel sounds are normal. There is no distension.  ?   Palpations: Abdomen is soft.  ?   Tenderness: There is no abdominal tenderness.  ?Musculoskeletal:  ?   Cervical back: Neck supple.  ?   Right lower leg: No edema.  ?   Left lower leg: No edema.  ?Lymphadenopathy:  ?   Cervical: No cervical  adenopathy.  ?Skin: ?   General: Skin is warm and dry.  ?   Capillary Refill: Capillary refill takes less than 2 seconds.  ?Neurological:  ?   General: No focal deficit present.  ?   Mental Status: She is alert. Mental

## 2022-03-30 ENCOUNTER — Non-Acute Institutional Stay: Payer: Medicare HMO | Admitting: Orthopedic Surgery

## 2022-03-30 ENCOUNTER — Encounter: Payer: Self-pay | Admitting: Orthopedic Surgery

## 2022-03-30 DIAGNOSIS — E7800 Pure hypercholesterolemia, unspecified: Secondary | ICD-10-CM

## 2022-03-30 DIAGNOSIS — R634 Abnormal weight loss: Secondary | ICD-10-CM | POA: Diagnosis not present

## 2022-03-30 DIAGNOSIS — G309 Alzheimer's disease, unspecified: Secondary | ICD-10-CM

## 2022-03-30 DIAGNOSIS — E538 Deficiency of other specified B group vitamins: Secondary | ICD-10-CM

## 2022-03-30 DIAGNOSIS — F0394 Unspecified dementia, unspecified severity, with anxiety: Secondary | ICD-10-CM

## 2022-03-30 DIAGNOSIS — G6289 Other specified polyneuropathies: Secondary | ICD-10-CM

## 2022-03-30 DIAGNOSIS — F0280 Dementia in other diseases classified elsewhere without behavioral disturbance: Secondary | ICD-10-CM

## 2022-03-30 NOTE — Progress Notes (Signed)
Location:  [facility] Room Number: AL32/A Place of Service:  ALF [phone]) Provider:  Yvonna Alanis, NP  Patient Care Team: Yvonna Alanis, NP as PCP - General (Adult Health Nurse Practitioner) Christophe Louis, MD as Consulting Physician (Obstetrics and Gynecology) Dennie Bible, NP as Nurse Practitioner (Neurology) Polly Cobia Dayton Bailiff, MD as Consulting Physician (Gynecologic Oncology)  Extended Emergency Contact Information Primary Emergency Contact: Ashby,Paul Address: [address]          [place], [place] [address] [place] Phone: [phone] Mobile Phone: [phone] Relation: Spouse Secondary Emergency Contact: O'Keeffe,Joanne Mobile Phone: [phone] Relation: Daughter  Code Status:  Full code Goals of care: Advanced Directive information    03/30/2022   10:26 AM  Advanced Directives  Does Patient Have a Medical Advance Directive? Yes  Type of Paramedic of [place];Living will  Does patient want to make changes to medical advance directive? No - Patient declined  Copy of [place] in Chart? Yes - validated most recent copy scanned in chart (See row information)     Chief Complaint  Patient presents with   Acute Visit    Weight loss    HPI:  Pt is a 87 y.o. female seen today for an acute visit for weight loss.   Weight loss- see weights below, Remeron increased to 15 mg 02/2022, only drinks Boost and will eat [place] candy Alzheimer's disease- onset 2010, exacerbated by chemotherapy for uterine cancer, poor tolerance to Aricept and Namenda in past, behaviors of increased anxiety/agitation- improved since starting Depakote, walking less, does not enjoy care rides anymore, remains on ativan prn HLD- LDL 66 08/2021, remains on Lipitor Polyneuropathy- onset 2010, remains on gabapentin Anxiety- remains on Zoloft Vitamin B12 deficiency-  Vit B12 686 08/2021, remains on  cyanocobalamin  Recent weights:  05/01- 90.4 lbs  04/13- 90.8 lbs  03/01- 100.2 lbs     Past Medical History:  Diagnosis Date   Abdominal adhesions    Alzheimer's disease with memory loss 06/25/2013   Back ache    Breast cancer ([facility])    Carcinosarcoma of uterus s/p TAH BSO 2010 11/22/2012   REPORT OF SURGICAL PATHOLOGY  Case #: [id number] Patient Name: Laura Perkins, Laura Perkins Office Chart Number: N/A  MRN: [id number] Pathologist: Vonna Kotyk B. Lyndon Code, MD DOB/Age 09-09-1935 (Age: 70) Gender: F Date Taken: 07/28/2009 Date Received: 07/28/2009  FINAL DIAGNOSIS  MICROSCOPIC EXAMINATION AND DIAGNOSIS  UTERUS, OVARIES AND FALLOPIAN TUBES, HYSTERECTOMY AND BILATERAL SALPINGO-OOPHORECTOMY: - CARCINOSARCOMA, ARISING FROM THE LOWER UTERINE SEGMENT, SPANNING 4.5 CM. - CARCINOSARCOMA IS 0.1 CM TO THE DEEP SOFT TISSUE RESECTION MARGIN. - CERVIX: ATROPHY AND CHRONIC INFLAMMATION. - ENDOMETRIUM: ATROPHIC APPEARING ENDOMETRIUM. - MYOMETRIUM: LEIOMYOMATA. - SEROSA: ESSENTIALLY UNREMARKABLE. - ADNEXA: PARATUBAL CYST  COMMENT Immunohistochemical stains were performed on the malignant neoplasm with the following results. The controls stained appropriately.  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)    Seasonal allergies    Shoulder joint pain    Uterine cancer [facility])    Past Surgical  History:  Procedure Laterality Date   ABDOMINAL HYSTERECTOMY  2010   Dr Christophe Louis   ANKLE SURGERY Left    HERNIA REPAIR     as a child   MASTECTOMY PARTIAL / LUMPECTOMY Left 1990   Breast sarcoma    Allergies  Allergen Reactions    Sulfa Antibiotics Anaphylaxis    Outpatient Encounter Medications as of 03/30/2022  Medication Sig   atorvastatin (LIPITOR) 10 MG tablet Take 10 mg by mouth every other day.   atorvastatin (LIPITOR) 20 MG tablet Take 20 mg by mouth every other day. Alternate 10 mg (1/2 tablet) with 20 mg (1 tablet ) every other day   cyanocobalamin 1000 MCG tablet Take 1,000 mcg by mouth daily.   diclofenac Sodium (VOLTAREN) 1 % GEL Apply 1 g topically in the morning, at noon, and at bedtime.   divalproex (DEPAKOTE) 125 MG DR tablet Take 125 mg by mouth 2 (two) times daily.   gabapentin (NEURONTIN) 100 MG capsule Take 100 mg by mouth 2 (two) times daily.   LORazepam (ATIVAN) 0.5 MG tablet Take 1 tablet (0.5 mg total) by mouth at bedtime as needed for anxiety.   mirtazapine (REMERON) 7.5 MG tablet Take 15 mg by mouth daily.   ondansetron (ZOFRAN-ODT) 4 MG disintegrating tablet Take 4 mg by mouth every 4 (four) hours as needed for nausea or vomiting.   sertraline (ZOLOFT) 25 MG tablet Take 25 mg by mouth every morning.   Wheat Dextrin (BENEFIBER PO) Take 15 mLs by mouth 2 (two) times daily.   No facility-administered encounter medications on file as of 03/30/2022.    Review of Systems  Unable to perform ROS: Dementia   Immunization History  Administered Date(s) Administered   Influenza, High Dose Seasonal PF 07/26/2017   Influenza-Unspecified 08/06/2018, 07/08/2019, 07/22/2020, 09/05/2021   PFIZER(Purple Top)SARS-COV-2 Vaccination 12/25/2019, 01/17/2020, 08/14/2020, 03/24/2021   Pfizer Covid-19 Vaccine Bivalent Booster 37yr & up 08/02/2021   Pneumococcal Conjugate-13 10/01/2014   Pneumococcal Polysaccharide-23 07/02/2004   Td 07/02/2008   Tdap 09/02/2018   Zoster Recombinat (Shingrix) 10/17/2020, 01/17/2021   Zoster, Live 07/02/2008   Pertinent  Health Maintenance Due  Topic Date Due   DEXA SCAN  Never done   INFLUENZA VACCINE  06/12/2022      07/26/2021    3:05 PM  FMontrosein the  past year? 1  Was there an injury with Fall? 0  Fall Risk Category Calculator 1  Fall Risk Category Low  Patient Fall Risk Level Moderate fall risk  Patient at Risk for Falls Due to Other (Comment)  Patient at Risk for Falls Due to - Comments Alzheimers dementia  Fall risk Follow up Falls evaluation completed;Education provided;Falls prevention discussed   Functional Status Survey:    Vitals:   03/30/22 1024  BP: (!) 127/91  Pulse: 69  Resp: 18  Temp: 97.8 F (36.6 C)  SpO2: 94%  Weight: 90 lb 6.4 oz (41 kg)  Height: '5\' 5"'$  (1.651 m)   Body mass index is 15.04 kg/m. Physical Exam Vitals reviewed.  Constitutional:      General: She is not in acute distress. Eyes:     General:        Right eye: No discharge.        Left eye: No discharge.  Cardiovascular:     Rate and Rhythm: Normal rate and regular rhythm.     Pulses: Normal pulses.     Heart sounds: Normal heart sounds. No murmur heard.  Pulmonary:     Effort: Pulmonary effort is normal. No respiratory distress.     Breath sounds: Normal breath sounds. No wheezing.  Abdominal:     General: Bowel sounds are normal. There is no distension.     Palpations: Abdomen is soft.     Tenderness: There is no abdominal tenderness.  Musculoskeletal:     Right lower leg: No edema.     Left lower leg: No edema.  Skin:    General: Skin is warm and dry.     Capillary Refill: Capillary refill takes less than 2 seconds.  Neurological:     General: No focal deficit present.     Mental Status: She is alert. Mental status is at baseline.     Motor: Weakness present.     Gait: Gait abnormal.  Psychiatric:        Mood and Affect: Mood normal.        Behavior: Behavior normal.        Cognition and Memory: Cognition is impaired. Memory is impaired.     Comments: Very pleasant, follows commands, alert to self and person    Labs reviewed: Recent Labs    06/19/21 0000 12/21/21 0000 02/06/22 0000  NA 144 142 140  K 4.1 3.7 3.9   CL 106 107 108  CO2 26* 25* 26*  BUN '11 8 19  '$ CREATININE 0.8 0.7 0.7  CALCIUM 9.4 8.7 9.3   Recent Labs    06/19/21 0000 12/21/21 0000 02/06/22 0000  AST 13 10* 14  ALT 9 4* 9  ALKPHOS 77 60 63  ALBUMIN 4.0 3.0* 3.7   Recent Labs    06/19/21 0000 12/21/21 0000 02/06/22 0000  WBC 4.6 2.9 6.9  NEUTROABS 2,415.00 980.00 4,685.00  HGB 13.2 11.4* 11.9*  HCT 41 34* 36  PLT 332 329 317   Lab Results  Component Value Date   TSH 2.71 05/11/2021   Lab Results  Component Value Date   HGBA1C 5.4 08/14/2018   Lab Results  Component Value Date   CHOL 121 08/14/2021   HDL 36 08/14/2021   LDLCALC 66 08/14/2021   TRIG 105 08/14/2021    Significant Diagnostic Results in last 30 days:  No results found.  Assessment/Plan 1. Weight loss - related to AD - BMI 15.04, albumin 3.7 02/06/2022 - will increase Boost shakes to TID- add scoop ice cream with shakes - dietary consult to consider med pass - cont Remeron - husband considering hospice enrollment if weight loss continues  2. Alzheimer's dementia without behavioral disturbance ([facility]) - progressing, see above - no behavioral outbursts - ambulates with walker- does not go outside anymore - cont Depakote and ativan  3. Hypercholesterolemia - LDL stable - cont Lipitor  4. Other polyneuropathy - cont gabapentin  5. Anxiety due to dementia ([facility]) - cont ativan   6. Vitamin B12 deficiency - cont B12 supplement    Family/ staff Communication: plan discussed with patient, husband, and nurse  Labs/tests ordered:  none

## 2022-05-24 ENCOUNTER — Encounter: Payer: Self-pay | Admitting: Orthopedic Surgery

## 2022-05-25 ENCOUNTER — Encounter: Payer: Self-pay | Admitting: Orthopedic Surgery

## 2022-05-25 ENCOUNTER — Non-Acute Institutional Stay: Payer: Medicare HMO | Admitting: Orthopedic Surgery

## 2022-05-25 DIAGNOSIS — F0394 Unspecified dementia, unspecified severity, with anxiety: Secondary | ICD-10-CM

## 2022-05-25 DIAGNOSIS — E7800 Pure hypercholesterolemia, unspecified: Secondary | ICD-10-CM | POA: Diagnosis not present

## 2022-05-25 DIAGNOSIS — R627 Adult failure to thrive: Secondary | ICD-10-CM

## 2022-05-25 DIAGNOSIS — E538 Deficiency of other specified B group vitamins: Secondary | ICD-10-CM

## 2022-05-25 DIAGNOSIS — E43 Unspecified severe protein-calorie malnutrition: Secondary | ICD-10-CM

## 2022-05-25 DIAGNOSIS — G309 Alzheimer's disease, unspecified: Secondary | ICD-10-CM

## 2022-05-25 DIAGNOSIS — F0280 Dementia in other diseases classified elsewhere without behavioral disturbance: Secondary | ICD-10-CM

## 2022-05-25 DIAGNOSIS — G6289 Other specified polyneuropathies: Secondary | ICD-10-CM | POA: Diagnosis not present

## 2022-05-25 NOTE — Progress Notes (Signed)
Location:  [facility] Room Number: AL32/A Place of Service:  ALF [phone]) Provider:  Yvonna Alanis, NP  Patient Care Team: Yvonna Alanis, NP as PCP - General (Adult Health Nurse Practitioner) Christophe Louis, MD as Consulting Physician (Obstetrics and Gynecology) Dennie Bible, NP as Nurse Practitioner (Neurology) Polly Cobia [place] Bailiff, MD as Consulting Physician (Gynecologic Oncology)  Extended Emergency Contact Information Primary Emergency Contact: Ashby,Paul Address: [address]          [place], [place] [address] Johnnette Litter of [place] Phone: [phone] Mobile Phone: [phone] Relation: Spouse Secondary Emergency Contact: O'Keeffe,Joanne Mobile Phone: [phone] Relation: Daughter  Code Status:  Full code Goals of care: Advanced Directive information    05/25/2022   10:09 AM  Advanced Directives  Does Patient Have a Medical Advance Directive? Yes  Type of Paramedic of [place];Living will  Does patient want to make changes to medical advance directive? No - Patient declined  Copy of [place] in Chart? Yes - validated most recent copy scanned in chart (See row information)     Chief Complaint  Patient presents with   Acute Visit    Weight loss    HPI:  Pt is a 87 y.o. female seen today for an acute visit for weight loss.   Husband present for encounter. She has had progressive weight loss the past few months. Recent weight 86 lbs 05/13/2022. See weight trends below. Remains on Remeron 15 mg daily. Offered Boost with ice cream TID. Husband comes every morning trying to get her to eat. Family and staff have tried promoting her favorite foods at mealtime. Discussed treatment options with husband, he would like to consult hospice.   Alzheimer's disease- onset 2010, exacerbated by chemotherapy for uterine cancer, poor tolerance to Aricept and Namenda in past, lives in [place],  walking only to nurses  station, speaking less, still able to perform some ADLs like toileting, remains on ativan prn HLD- LDL 66 08/2021, remains on Lipitor Polyneuropathy- onset 2010, remains on gabapentin Anxiety- remains on Zoloft Vitamin B12 deficiency-  Vit B12 686 08/2021, remains on cyanocobalamin   Recent weights:  07/02- 86 lbs  06/01- 88.2 lbs  05/01- 90.4 lbs  03/01- 100.2 lbs  Past Medical History:  Diagnosis Date   Abdominal adhesions    Alzheimer's disease with memory loss 06/25/2013   Back ache    Breast cancer ([place])    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 Lyndon Code, MD DOB/Age [birth year] (Age: 49) Gender: F Date Taken: 07/28/2009 Date Received: 07/28/2009  FINAL DIAGNOSIS  MICROSCOPIC EXAMINATION AND DIAGNOSIS  UTERUS, OVARIES AND FALLOPIAN TUBES, HYSTERECTOMY AND BILATERAL SALPINGO-OOPHORECTOMY: - CARCINOSARCOMA, ARISING FROM THE LOWER UTERINE SEGMENT, SPANNING 4.5 CM. - CARCINOSARCOMA IS 0.1 CM TO THE DEEP SOFT TISSUE RESECTION MARGIN. - CERVIX: ATROPHY AND CHRONIC INFLAMMATION. - ENDOMETRIUM: ATROPHIC APPEARING ENDOMETRIUM. - MYOMETRIUM: LEIOMYOMATA. - SEROSA: ESSENTIALLY UNREMARKABLE. - ADNEXA: PARATUBAL CYST  COMMENT Immunohistochemical stains were performed on the malignant neoplasm with the following results. The controls stained appropriately.  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   atorvastatin (LIPITOR) 20 MG tablet Take 20 mg by mouth every other day. Alternate 10 mg (1/2 tablet) with 20 mg (1 tablet ) every other day   cyanocobalamin 1000 MCG tablet Take 1,000 mcg by mouth daily.   diclofenac Sodium (VOLTAREN) 1 % GEL Apply 1 g topically in the morning, at noon, and at bedtime.   divalproex (DEPAKOTE) 125 MG DR tablet Take 125 mg by mouth 2 (two) times daily.   gabapentin (NEURONTIN) 100 MG capsule Take 100 mg by mouth 2 (two) times daily.   LORazepam (ATIVAN) 0.5 MG tablet Take 1 tablet (0.5 mg total) by mouth at bedtime as needed for anxiety.   mirtazapine (REMERON) 7.5 MG tablet Take 15 mg by mouth daily.   ondansetron (ZOFRAN-ODT) 4 MG disintegrating tablet Take 4 mg by mouth every 4 (four) hours as needed for nausea or vomiting.   sertraline (ZOLOFT) 25 MG tablet Take 25 mg by mouth every morning.   Wheat Dextrin (BENEFIBER PO) Take 15 mLs by mouth 2 (two) times daily.   No facility-administered encounter medications on file as of 05/25/2022.    Review of Systems  Unable to perform ROS: Dementia    Immunization History  Administered Date(s) Administered   Influenza, High Dose Seasonal PF 07/26/2017   Influenza-Unspecified 08/06/2018, 07/08/2019, 07/22/2020, 09/05/2021   PFIZER(Purple Top)SARS-COV-2 Vaccination 12/25/2019, 01/17/2020, 08/14/2020, 03/24/2021   Pfizer Covid-19 Vaccine Bivalent Booster 25yr & up 08/02/2021   Pneumococcal Conjugate-13 10/01/2014   Pneumococcal Polysaccharide-23 07/02/2004    Td 07/02/2008   Tdap 09/02/2018   Zoster Recombinat (Shingrix) 10/17/2020, 01/17/2021   Zoster, Live 07/02/2008   Pertinent  Health Maintenance Due  Topic Date Due   DEXA SCAN  Never done   INFLUENZA VACCINE  06/12/2022      07/26/2021    3:05 PM  [place]  Was there an injury with Fall? 0  Fall Risk Category Calculator 1  Fall Risk Category Low  Patient Fall Risk Level Moderate fall risk  Patient at Risk for Falls Due to Other (Comment)  Patient at Risk for Falls Due to - Comments Alzheimers dementia  Fall risk Follow up Falls evaluation completed;Education provided;Falls prevention discussed   Functional Status Survey:    Vitals:   05/25/22 1003  BP: 92/60  Pulse: 66  Resp: 16  Temp: (!) 97.2 F (36.2 C)  SpO2: 96%  Weight: 86 lb (39 kg)  Height: '5\' 5"'$  (1.651 m)   Body mass index is 14.31 kg/m. Physical Exam Vitals reviewed.  Constitutional:      General: She is not in acute distress.    Appearance: She is cachectic.  HENT:     Head: Normocephalic.  Eyes:     General:        Right eye: No discharge.        Left eye: No discharge.  Cardiovascular:     Rate and Rhythm: Normal rate and regular rhythm.     Pulses: Normal pulses.     Heart sounds: Normal heart sounds.  Pulmonary:     Effort: Pulmonary effort is normal. No respiratory distress.     Breath sounds: Normal breath sounds. No wheezing.  Abdominal:     General: Bowel sounds are normal. There is no distension.     Palpations: Abdomen is soft.     Tenderness: There is no abdominal tenderness.  Musculoskeletal:     Cervical back: Neck supple.     Right lower leg: No edema.     Left lower leg: No edema.  Skin:    General: Skin is warm and dry.     Capillary Refill: Capillary refill takes less than 2 seconds.  Neurological:     General: No focal deficit present.     Mental Status: She is alert. Mental status is at baseline.     Motor: Weakness present.     Gait: Gait  abnormal.  Psychiatric:        Mood and Affect: Mood normal.        Behavior: Behavior normal.     Comments: Gives short phrases, follows commands, alert to self and familiar face     Labs reviewed: Recent Labs    06/19/21 0000 12/21/21 0000 02/06/22 0000  NA 144 142 140  K 4.1 3.7 3.9  CL 106 107 108  CO2 26* 25* 26*  BUN '11 8 19  '$ CREATININE 0.8 0.7 0.7  CALCIUM 9.4 8.7 9.3   Recent Labs    06/19/21 0000 12/21/21 0000 02/06/22 0000  AST 13 10* 14  ALT 9 4* 9  ALKPHOS 77 60 63  ALBUMIN 4.0 3.0* 3.7   Recent Labs    06/19/21 0000 12/21/21 0000 02/06/22 0000  WBC 4.6 2.9 6.9  NEUTROABS 2,415.00 980.00 4,685.00  HGB 13.2 11.4* 11.9*  HCT 41 34* 36  PLT 332 329 317   Lab Results  Component Value Date   TSH 2.71 05/11/2021   Lab Results  Component Value Date   HGBA1C 5.4 08/14/2018   Lab Results  Component Value Date   CHOL 121 08/14/2021   HDL 36 08/14/2021   LDLCALC 66 08/14/2021   TRIG 105 08/14/2021    Significant Diagnostic Results in last 30 days:  No results found.  Assessment/Plan 1. Adult failure to thrive - current weight 86 lbs 05/13/2022 - BMI 14.31 - cachexia on exam - associated with advanced AD - cont Remeron and Boost  - hospice consult  2. Severe protein calorie malnutrition - see above  2. Alzheimer's dementia without behavioral disturbance ([place]) - no behaviors - still performing some ADLs like toileting - ambulating and talking less - cont Depakote and ativan prn  3. Hypercholesterolemia - cont statin  4. Other polyneuropathy - cont gabapentin  5. Anxiety due to dementia ([place]) - cont Zoloft and ativan prn  6. Vitamin B12 deficiency - cont cyanocobalamin   Family/ staff Communication: plan discussed with patient and nurse  Labs/tests ordered:  hospice consult

## 2022-05-31 DIAGNOSIS — F0280 Dementia in other diseases classified elsewhere without behavioral disturbance: Secondary | ICD-10-CM | POA: Diagnosis not present

## 2022-05-31 DIAGNOSIS — F0394 Unspecified dementia, unspecified severity, with anxiety: Secondary | ICD-10-CM | POA: Diagnosis not present

## 2022-05-31 DIAGNOSIS — G309 Alzheimer's disease, unspecified: Secondary | ICD-10-CM | POA: Diagnosis not present

## 2022-06-29 ENCOUNTER — Other Ambulatory Visit: Payer: Self-pay | Admitting: Orthopedic Surgery

## 2022-06-29 DIAGNOSIS — F0280 Dementia in other diseases classified elsewhere without behavioral disturbance: Secondary | ICD-10-CM

## 2022-06-29 MED ORDER — LORAZEPAM 0.5 MG PO TABS: 0.5000 mg | ORAL_TABLET | Freq: Every evening | ORAL | 5 refills | Status: DC | PRN

## 2022-07-02 ENCOUNTER — Telehealth: Payer: Self-pay

## 2022-07-02 NOTE — Telephone Encounter (Signed)
Thanks

## 2022-07-02 NOTE — Telephone Encounter (Signed)
I have discussed this with [facility] nurse, KD. She is investigating issue. Patient is followed by hospice. I assume order came from them over weekend.

## 2022-07-02 NOTE — Telephone Encounter (Signed)
Caryl Pina [facility] called wanting to know if recent prescription for lorazepam 0.'5mg'$  is replacing the scheduled dose or replacing it  Message routed to Windell Moulding, NP

## 2022-08-04 ENCOUNTER — Encounter: Payer: Self-pay | Admitting: Orthopedic Surgery

## 2022-08-06 ENCOUNTER — Encounter: Payer: Self-pay | Admitting: Orthopedic Surgery

## 2022-08-06 ENCOUNTER — Non-Acute Institutional Stay: Payer: Medicare HMO | Admitting: Orthopedic Surgery

## 2022-08-06 DIAGNOSIS — G309 Alzheimer's disease, unspecified: Secondary | ICD-10-CM

## 2022-08-06 DIAGNOSIS — F02C11 Dementia in other diseases classified elsewhere, severe, with agitation: Secondary | ICD-10-CM

## 2022-08-06 DIAGNOSIS — G301 Alzheimer's disease with late onset: Secondary | ICD-10-CM

## 2022-08-06 DIAGNOSIS — R627 Adult failure to thrive: Secondary | ICD-10-CM | POA: Diagnosis not present

## 2022-08-06 DIAGNOSIS — F0280 Dementia in other diseases classified elsewhere without behavioral disturbance: Secondary | ICD-10-CM

## 2022-08-06 MED ORDER — LORAZEPAM 0.5 MG PO TABS: 0.5000 mg | ORAL_TABLET | ORAL | 5 refills | Status: DC | PRN

## 2022-08-06 NOTE — Progress Notes (Signed)
Location:  [place] Room Number: [place] of Service:  ALF [phone]) Provider:  Yvonna Alanis, NP   Yvonna Alanis, NP  Patient Care Team: Yvonna Alanis, NP as PCP - General (Adult Health Nurse Practitioner) Christophe Louis, MD as Consulting Physician (Obstetrics and Gynecology) Dennie Bible, NP as Nurse Practitioner (Neurology) Polly Cobia [place] Bailiff, MD as Consulting Physician (Gynecologic Oncology)  Extended Emergency Contact Information Primary Emergency Contact: Ashby,Paul Address: [address]          [place], [place] [address] Johnnette Litter of [place] Phone: [phone] Mobile Phone: [phone] Relation: Spouse Secondary Emergency Contact: O'Keeffe,Joanne Mobile Phone: [phone] Relation: Daughter  Code Status:  DNR Goals of care: Advanced Directive information    05/25/2022   10:09 AM  Advanced Directives  Does Patient Have a Medical Advance Directive? Yes  Type of Paramedic of [place];Living will  Does patient want to make changes to medical advance directive? No - Patient declined  Copy of [place] in Chart? Yes - validated most recent copy scanned in chart (See row information)     Chief Complaint  Patient presents with   Acute Visit    agitation    HPI:  Pt is a 87 y.o. female seen today for acute visit due to agitation.   Followed by hospice due to late stage Alzheimers dementia. 09/23 husband reports increased agitation and noncompliance with taking medication. She has also stopped drinking Boost shakes. She was started on roxanol and ativan for comfort. She appears calm this morning during our encounter.    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 Lyndon Code, MD DOB/Age Sep 09, 1935 (Age: 87) Gender: F Date Taken: 07/28/2009 Date Received: 07/28/2009  FINAL DIAGNOSIS  MICROSCOPIC EXAMINATION AND DIAGNOSIS  UTERUS, OVARIES AND FALLOPIAN TUBES, HYSTERECTOMY AND BILATERAL SALPINGO-OOPHORECTOMY: - CARCINOSARCOMA, ARISING FROM THE LOWER UTERINE SEGMENT, SPANNING 4.5 CM. - CARCINOSARCOMA IS 0.1 CM TO THE DEEP SOFT TISSUE RESECTION MARGIN. - CERVIX: ATROPHY AND CHRONIC INFLAMMATION. - ENDOMETRIUM: ATROPHIC APPEARING ENDOMETRIUM. - MYOMETRIUM: LEIOMYOMATA. - SEROSA: ESSENTIALLY UNREMARKABLE. - ADNEXA: PARATUBAL CYST  COMMENT Immunohistochemical stains were performed on the malignant neoplasm with the following results. The controls stained appropriately.  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   atorvastatin (LIPITOR) 20 MG tablet Take 20 mg by mouth every other day. Alternate 10 mg (1/2 tablet) with 20 mg (1 tablet ) every other day   cyanocobalamin 1000 MCG tablet Take 1,000 mcg  by mouth daily.   diclofenac Sodium (VOLTAREN) 1 % GEL Apply 1 g topically in the morning, at noon, and at bedtime.   divalproex (DEPAKOTE) 125 MG DR tablet Take 125 mg by mouth 2 (two) times daily.   gabapentin (NEURONTIN) 100 MG capsule Take 100 mg by mouth 2 (two) times daily.   LORazepam (ATIVAN) 0.5 MG tablet Take 1 tablet (0.5 mg total) by mouth at bedtime as needed for anxiety.   mirtazapine (REMERON) 7.5 MG tablet Take 15 mg by mouth daily.   ondansetron (ZOFRAN-ODT) 4 MG disintegrating tablet Take 4 mg by mouth every 4 (four) hours as needed for nausea or vomiting.   sertraline (ZOLOFT) 25 MG tablet Take 25 mg by mouth every morning.   Wheat Dextrin (BENEFIBER PO) Take 15 mLs by mouth 2 (two) times daily.   No facility-administered encounter medications on file as of 08/06/2022.    Review of Systems  Unable to perform ROS: Dementia    Immunization History  Administered Date(s) Administered   Influenza, High Dose Seasonal PF 07/26/2017   Influenza-Unspecified 08/06/2018, 07/08/2019, 07/22/2020, 09/05/2021   PFIZER(Purple Top)SARS-COV-2 Vaccination 12/25/2019, 01/17/2020, 08/14/2020, 03/24/2021   Pfizer Covid-19 Vaccine Bivalent Booster 62yr & up 08/02/2021   Pneumococcal Conjugate-13 10/01/2014   Pneumococcal Polysaccharide-23 07/02/2004   Td 07/02/2008   Tdap 09/02/2018   Zoster Recombinat (Shingrix) 10/17/2020, 01/17/2021   Zoster, Live 07/02/2008   Pertinent  Health Maintenance Due  Topic Date Due   DEXA SCAN  Never done   INFLUENZA VACCINE  06/12/2022      07/26/2021    3:05 PM  [place]  Was there an injury with Fall? 0  Fall Risk Category Calculator 1  Fall Risk Category Low  Patient Fall Risk Level Moderate fall risk  Patient at Risk for Falls Due to Other (Comment)  Patient at Risk for Falls Due to - Comments Alzheimers dementia  Fall risk Follow up Falls evaluation completed;Education provided;Falls prevention discussed    Functional Status Survey:    Vitals:   08/06/22 1143  BP: 114/73  Pulse: 85  Resp: (!) 22  Temp: 98.2 F (36.8 C)  SpO2: 90%  Weight: 72 lb 12.8 oz (33 kg)  Height: '5\' 5"'$  (1.651 m)   Body mass index is 12.11 kg/m. Physical Exam Vitals reviewed.  Constitutional:      Appearance: She is cachectic.  Eyes:     General:        Right eye: No discharge.        Left eye: No discharge.  Cardiovascular:     Rate and Rhythm: Normal rate and regular rhythm.     Pulses: Normal pulses.     Heart sounds: Normal heart sounds.  Pulmonary:     Effort: Pulmonary effort is normal. No respiratory distress.     Breath sounds: Normal breath sounds. No wheezing.  Abdominal:     General: Bowel sounds are normal. There is no distension.     Palpations: Abdomen is soft.     Tenderness: There is no abdominal tenderness.  Musculoskeletal:     Cervical back: Neck supple.     Right lower leg: No edema.     Left lower leg: No edema.  Skin:    General: Skin is warm and dry.  Neurological:     General: No focal deficit present.     Mental Status: Mental status is at baseline. She is lethargic.     Motor: Weakness present.     Gait: Gait abnormal.  Psychiatric:     Comments: Nonverbal, does not follow commands     Labs reviewed: Recent Labs    12/21/21 0000 02/06/22 0000  NA 142 140  K 3.7 3.9  CL 107 108  CO2 25* 26*  BUN 8 19  CREATININE 0.7 0.7  CALCIUM 8.7 9.3   Recent Labs    12/21/21 0000 02/06/22 0000  AST 10* 14  ALT 4* 9  ALKPHOS 60 63  ALBUMIN 3.0* 3.7   Recent Labs    12/21/21 0000 02/06/22 0000  WBC 2.9 6.9  NEUTROABS 980.00 4,685.00  HGB 11.4* 11.9*  HCT 34* 36  PLT 329 317   Lab Results  Component Value Date   TSH 2.71 05/11/2021   Lab Results  Component Value Date   HGBA1C 5.4 08/14/2018   Lab Results  Component Value Date   CHOL 121 08/14/2021   HDL 36 08/14/2021   LDLCALC 66 08/14/2021   TRIG 105 08/14/2021    Significant Diagnostic  Results in last 30 days:  No results found.  Assessment/Plan 1. Adult failure to thrive - followed by hospice - BMI 12.11, 72.8 lbs - stopped eating 09/23 - noncompliant with po meds - will discontinue po medications - roxanol and ativan started 09/23 - agitation improved with roxanol and ativan  2. Severe late onset Alzheimer's dementia with agitation ([place]) - see above   Family/ staff Communication: plan discussed with husband and nurse  Labs/tests ordered:  none

## 2022-08-07 ENCOUNTER — Other Ambulatory Visit: Payer: Self-pay | Admitting: Orthopedic Surgery

## 2022-08-07 DIAGNOSIS — Z515 Encounter for palliative care: Secondary | ICD-10-CM

## 2022-08-07 MED ORDER — HALOPERIDOL 5 MG PO TABS: 5.0000 mg | ORAL_TABLET | Freq: Four times a day (QID) | ORAL | 0 refills | Status: DC | PRN

## 2022-08-07 MED ORDER — HALOPERIDOL 5 MG PO TABS: 5.0000 mg | ORAL_TABLET | Freq: Four times a day (QID) | ORAL | 0 refills | Status: AC | PRN

## 2022-08-12 DEATH — deceased
